# Patient Record
Sex: Male | Born: 1945
Health system: Southern US, Community
[De-identification: ages and names within clinical notes are randomized; demographics above are authoritative.]

## PROBLEM LIST (undated history)

## (undated) DIAGNOSIS — C61 Malignant neoplasm of prostate: Secondary | ICD-10-CM

## (undated) DIAGNOSIS — I499 Cardiac arrhythmia, unspecified: Secondary | ICD-10-CM

## (undated) DIAGNOSIS — E119 Type 2 diabetes mellitus without complications: Secondary | ICD-10-CM

## (undated) DIAGNOSIS — H409 Unspecified glaucoma: Principal | ICD-10-CM

## (undated) DIAGNOSIS — N529 Male erectile dysfunction, unspecified: Secondary | ICD-10-CM

## (undated) DIAGNOSIS — Z8719 Personal history of other diseases of the digestive system: Secondary | ICD-10-CM

## (undated) DIAGNOSIS — M75102 Unspecified rotator cuff tear or rupture of left shoulder, not specified as traumatic: Secondary | ICD-10-CM

## (undated) HISTORY — PX: PROSTATE BIOPSY: SHX241

## (undated) HISTORY — DX: Unspecified glaucoma: H40.9

## (undated) HISTORY — DX: Cardiac arrhythmia, unspecified: I49.9

## (undated) HISTORY — DX: Male erectile dysfunction, unspecified: N52.9

## (undated) HISTORY — PX: HERNIA REPAIR: SHX51

## (undated) HISTORY — PX: REFRACTIVE SURGERY: SHX103

## (undated) HISTORY — PX: HAND SURGERY: SHX662

## (undated) HISTORY — DX: Unspecified rotator cuff tear or rupture of left shoulder, not specified as traumatic: M75.102

---

## 1995-07-26 HISTORY — PX: BACK SURGERY: SHX140

## 2005-07-25 HISTORY — PX: SHOULDER ARTHROSCOPY: SHX128

## 2010-01-12 LAB — CBC AND DIFFERENTIAL
Platelets: 163 10*3/uL (ref 150–399)
WBC: 6.6 10^3/mL

## 2011-07-12 DIAGNOSIS — M25512 Pain in left shoulder: Secondary | ICD-10-CM | POA: Insufficient documentation

## 2011-07-13 DIAGNOSIS — Z8679 Personal history of other diseases of the circulatory system: Secondary | ICD-10-CM | POA: Insufficient documentation

## 2011-07-13 DIAGNOSIS — N400 Enlarged prostate without lower urinary tract symptoms: Secondary | ICD-10-CM | POA: Insufficient documentation

## 2011-07-13 DIAGNOSIS — Z9889 Other specified postprocedural states: Secondary | ICD-10-CM | POA: Insufficient documentation

## 2011-07-26 DIAGNOSIS — Z8719 Personal history of other diseases of the digestive system: Secondary | ICD-10-CM

## 2011-07-26 HISTORY — DX: Personal history of other diseases of the digestive system: Z87.19

## 2011-09-21 DIAGNOSIS — D126 Benign neoplasm of colon, unspecified: Secondary | ICD-10-CM | POA: Insufficient documentation

## 2011-09-29 DIAGNOSIS — I499 Cardiac arrhythmia, unspecified: Secondary | ICD-10-CM | POA: Insufficient documentation

## 2012-04-17 ENCOUNTER — Ambulatory Visit (INDEPENDENT_AMBULATORY_CARE_PROVIDER_SITE_OTHER): Payer: Medicare Other | Admitting: Family Medicine

## 2012-04-17 ENCOUNTER — Encounter: Payer: Self-pay | Admitting: Family Medicine

## 2012-04-17 VITALS — BP 153/87 | HR 63 | Ht 75.0 in | Wt 236.0 lb

## 2012-04-17 DIAGNOSIS — Z298 Encounter for other specified prophylactic measures: Secondary | ICD-10-CM

## 2012-04-17 DIAGNOSIS — I10 Essential (primary) hypertension: Secondary | ICD-10-CM

## 2012-04-17 DIAGNOSIS — M75102 Unspecified rotator cuff tear or rupture of left shoulder, not specified as traumatic: Secondary | ICD-10-CM

## 2012-04-17 DIAGNOSIS — Z23 Encounter for immunization: Secondary | ICD-10-CM

## 2012-04-17 DIAGNOSIS — Z2989 Encounter for other specified prophylactic measures: Secondary | ICD-10-CM

## 2012-04-17 DIAGNOSIS — M5430 Sciatica, unspecified side: Secondary | ICD-10-CM | POA: Insufficient documentation

## 2012-04-17 HISTORY — DX: Unspecified rotator cuff tear or rupture of left shoulder, not specified as traumatic: M75.102

## 2012-04-17 MED ORDER — HYDROCODONE-ACETAMINOPHEN 7.5-500 MG PO TABS: ORAL_TABLET | ORAL | Status: DC

## 2012-04-17 MED ORDER — PREDNISONE 20 MG PO TABS: ORAL_TABLET | ORAL | Status: AC

## 2012-04-17 NOTE — Patient Instructions (Signed)
Let me know your full medication list.  We'll likely increase your blood pressure medicine.  Return in one month to check BP and pain level.

## 2012-04-17 NOTE — Progress Notes (Signed)
CC: Calvin Wise is a 66 y.o. male is here for Establish Care   Subjective: HPI:  Former patient of mine at [facility] who presents to establish care.  He is in acute complaint of left leg pain that's been present for matter of 4 weeks off and on. Was initially present for one week following a trip to [place] in [place] in a car. He came on with a spasm-like sensation in his left low back/buttock that radiates down the back of his leg all the way to the left foot. It improves when he is up walking around on it seems to get worse with inactivity in seeing for long periods of time. He's been using the Vicodin for his shoulder to treat this pain which helps her immensely but wears off within 4 hours. Stretching seems to help even more than medication and he got into a routine where he completely alleviated the pain for one week which have come back after shot away from his home exercise plan. He denies any trauma, motor sensory disturbance, saddle paresthesia, bowel or bladder incontinence. He tells me he had back surgery in the low lumbar region back around 1992 and was told that multiple nerves were "pinched "even following the procedure. He has never had this pain before  I saw him last he was seen in orthopedics at [facility] for a left rotator cuff tear they counseled him to hold off on surgery unless it deteriorates. Is not bothered his quality of life he continues to work at an [employer] ([employer] shop without considerable pain. He denies any motor sensory disturbance is in left upper extremity  He last saw Dr. Delton See at [facility] who started him on a blood pressure medication the name of which escapes him right now based on his description sounds it may be lisinopril. He denies recent chest pain, shortness of breath, orthopnea, peripheral edema, regular heartbeat.   Review Of Systems Outlined In HPI  Past Medical History  Diagnosis Date  . Left rotator cuff tear 04/17/2012     No  family history on file. reviewed  History  Substance Use Topics  . Smoking status: Not on file  . Smokeless tobacco: Not on file  . Alcohol Use: Not on file     Objective: Filed Vitals:   04/17/12 1355  BP: 153/87  Pulse: 63    General: Alert and Oriented, No Acute Distress HEENT: Pupils equal, round, reactive to light. Conjunctivae clear. Moist mucous membranes,Neck supple without palpable lymphadenopathy nor abnormal masses. Lungs: Clear to auscultation bilaterally, no wheezing/ronchi/rales.  Comfortable work of breathing. Good air movement. Cardiac: Regular rate and rhythm. Normal S1/S2.  No murmurs, rubs, nor gallops.   Extremities: No peripheral edema.  Strong peripheral pulses.  MSK: No midline back pain, 45 degrees of flexion extension and side bending in the lumbar vertebra, no paraspinal muscular sugar tenderness. No pain over the left SI joint. Straight leg raise is negative. FAB ER and FADIR DO not elicit pain. Femoral grind does not was pain, no pain with logrolling of the femur there is no pain with palpitation of the left greater trochanter. In summary pain is not reproducible with exam. Full-strength and left lower extremity full range of motion left lower extremity. Dermatomal sensations in the left lower extremity are intact L4 DTR two over four bilaterally. Gait is normal Mental Status: No depression, anxiety, nor agitation. Skin: Warm and dry.  Assessment & Plan: Loyal was seen today for establish care.  Diagnoses and associated orders for this visit:  Essential hypertension  Left rotator cuff tear - HYDROcodone-acetaminophen (LORTAB) 7.5-500 MG per tablet; One by mouth every six hours only as needed for shoulder pain.  Sciatica  Need for prophylactic immunotherapy - Flu vaccine greater than or equal to 3yo with preservative IM  Other Orders - predniSONE (DELTASONE) 20 MG tablet; Three tabs at once daily for five days.  His provide [place] with his full  outside medication list I prepped him that we'll likely need to go up on his antihypertensive regimen. He'll continue with his home exercise plan to help relieve his sciatic pain will add a burst of prednisone to help his pain we'll have him minimize the use of his hydrocodone, described him that ideally the hydrocodone would only be used for his shoulder pain. He'll return in 4 weeks for blood pressure check and pain update however will contact him after he gets his medical care to discuss antihypertensive treatments.   Return in about 4 weeks (around 05/15/2012).

## 2012-04-18 ENCOUNTER — Encounter: Payer: Self-pay | Admitting: *Deleted

## 2012-04-24 ENCOUNTER — Telehealth: Payer: Self-pay | Admitting: *Deleted

## 2012-04-24 DIAGNOSIS — M5430 Sciatica, unspecified side: Secondary | ICD-10-CM

## 2012-04-24 NOTE — Telephone Encounter (Signed)
If it's still bothering him I'd like for Calvin Wise to think about more intensive and personalized physical therapy with one of our physical therapy specialists.  If he'd like to go along with this, we could arrange it either here at the [facility] or in [place], at [facility] for example.  Just let me know if he'd like to pursue this.

## 2012-04-24 NOTE — Telephone Encounter (Signed)
Pt states he is still having pain in his legs. He states they hurt mainly in the day while he is at work. He is on his feet most of the day at work. Pt states they pain subsides when he is not on his feet as much. The prednisone did help for a period of time and pt is still trying to do the home exercises reccommended at the last visit.Please advise

## 2012-04-24 NOTE — Telephone Encounter (Signed)
Wonderful, referral placed.

## 2012-04-24 NOTE — Telephone Encounter (Signed)
Pt returned call and he is ok with doing PT here in our building.

## 2012-05-02 ENCOUNTER — Encounter: Payer: Self-pay | Admitting: Family Medicine

## 2012-05-02 DIAGNOSIS — N529 Male erectile dysfunction, unspecified: Secondary | ICD-10-CM

## 2012-05-02 DIAGNOSIS — H409 Unspecified glaucoma: Secondary | ICD-10-CM | POA: Insufficient documentation

## 2012-05-02 DIAGNOSIS — I499 Cardiac arrhythmia, unspecified: Secondary | ICD-10-CM

## 2012-05-02 HISTORY — DX: Cardiac arrhythmia, unspecified: I49.9

## 2012-05-02 HISTORY — DX: Male erectile dysfunction, unspecified: N52.9

## 2012-05-02 HISTORY — DX: Unspecified glaucoma: H40.9

## 2012-05-22 ENCOUNTER — Ambulatory Visit: Payer: Medicare Other | Admitting: Family Medicine

## 2012-05-23 ENCOUNTER — Encounter: Payer: Self-pay | Admitting: Family Medicine

## 2012-05-23 ENCOUNTER — Ambulatory Visit (INDEPENDENT_AMBULATORY_CARE_PROVIDER_SITE_OTHER): Payer: Medicare Other | Admitting: Family Medicine

## 2012-05-23 VITALS — BP 125/70 | HR 68 | Ht 75.0 in | Wt 239.0 lb

## 2012-05-23 DIAGNOSIS — Z13220 Encounter for screening for lipoid disorders: Secondary | ICD-10-CM

## 2012-05-23 DIAGNOSIS — N529 Male erectile dysfunction, unspecified: Secondary | ICD-10-CM

## 2012-05-23 DIAGNOSIS — Z131 Encounter for screening for diabetes mellitus: Secondary | ICD-10-CM

## 2012-05-23 DIAGNOSIS — M5430 Sciatica, unspecified side: Secondary | ICD-10-CM

## 2012-05-23 DIAGNOSIS — I10 Essential (primary) hypertension: Secondary | ICD-10-CM

## 2012-05-23 NOTE — Progress Notes (Signed)
CC: Calvin Wise is a 66 y.o. male is here for Hypertension   Subjective: HPI:  Hypertension: At his last visit he was taking lisinopril 10 mg, he had been [facility] and noticing that his blood pressure was well under 140/90 after a few days of forgetting to take lisinopril. He continued to stop taking lisinopril and his blood pressures have all been well under 140/90. He decided to stop taking this indefinitely. He denies headaches, motor sensory disturbances, chest pain, irregular heartbeat, orthopnea, shortness of breath, nor peripheral edema. He reports no intolerance to lisinopril  Erectile dysfunction: He continues to take 50 mg of Viagra denies any trouble initiating or maintaining an erection. He is happy with the response of his medication.  Sciatica: He was scheduled for formal physical therapy however on the day at his first visit the sciatica at abruptly disappeared therefore he decided not to go to physical therapy. He's not doing a home exercise plan and for the past weeks his describes absolutely no pain whatsoever.  Since his last visit I reviewed some outside lab work, he's due for cholesterol screening as well as diabetic screening. He has not been fasting today   Review Of Systems Outlined In HPI  Past Medical History  Diagnosis Date  . Left rotator cuff tear 04/17/2012  . Glaucoma 05/02/2012  . Irregular heartbeat (Declines Workup 09/2011 [facility] Le[facility] Astle) 05/02/2012  . Erectile dysfunction 05/02/2012     History reviewed. No pertinent family history.   History  Substance Use Topics  . Smoking status: Former Smoker    Quit date: 05/24/2007  . Smokeless tobacco: Not on file  . Alcohol Use: Not on file     Objective: Filed Vitals:   05/23/12 1528  BP: 125/70  Pulse: 68    General: Alert and Oriented, No Acute Distress HEENT: Pupils equal, round, reactive to light. Conjunctivae clear.  External ears unremarkable,Moist mucous membranes,  pharynx without inflammation nor lesions. Lungs: Clear to auscultation bilaterally, no wheezing/ronchi/rales.  Comfortable work of breathing. Good air movement. Cardiac: Regular rate and rhythm. Normal S1/S2.  No murmurs, rubs, nor gallops.  No carotid bruits Abdomen: Soft nontender Extremities: No peripheral edema.  Strong peripheral pulses.  Mental Status: No depression, anxiety, nor agitation. Skin: Warm and dry.  Assessment & Plan: Calvin Wise was seen today for hypertension.  Diagnoses and associated orders for this visit:  Sciatica  Essential hypertension - BASIC METABOLIC PANEL WITH GFR  Need for lipid screening - Lipid panel  Diabetes mellitus screening - HgB A1c  Erectile dysfunction    Erectile dysfunction is stable, sciatica is now resolved, he is normotensive without being on any antihypertensives. Given his history of hypertension I like to check his renal function along with his fasting lipid panel, we'll do an A1c to screen for diabetes as well.  Return in about 3 months (around 08/23/2012).

## 2012-05-31 LAB — HEMOGLOBIN A1C: Hgb A1c MFr Bld: 6.5 % — ABNORMAL HIGH (ref <5.7)

## 2012-05-31 LAB — LIPID PANEL
HDL: 34 mg/dL — ABNORMAL LOW (ref >39)
LDL Cholesterol: 74 mg/dL (ref 0–99)
Total CHOL/HDL Ratio: 3.7 Ratio
Triglycerides: 91 mg/dL (ref <150)
VLDL: 18 mg/dL (ref 0–40)

## 2012-05-31 LAB — BASIC METABOLIC PANEL WITH GFR
Calcium: 9.2 mg/dL (ref 8.4–10.5)
Creat: 1.13 mg/dL (ref 0.50–1.35)
GFR, Est African American: 78 mL/min
GFR, Est Non African American: 67 mL/min

## 2012-06-28 ENCOUNTER — Telehealth: Payer: Self-pay

## 2012-06-28 NOTE — Telephone Encounter (Signed)
Patient called and left a message on nurse line asking for a return call.   Returned Call: Left message asking patient to call back.  

## 2012-06-29 ENCOUNTER — Telehealth: Payer: Self-pay | Admitting: *Deleted

## 2012-06-29 DIAGNOSIS — M5430 Sciatica, unspecified side: Secondary | ICD-10-CM

## 2012-06-29 MED ORDER — PREDNISONE 20 MG PO TABS: ORAL_TABLET | ORAL | Status: AC

## 2012-06-29 NOTE — Telephone Encounter (Signed)
Sue Lush, Will you please let Mr. Arts know that i sent in a refill for steroids, if he needs new copies of home exercise stretches please let me know because I'll be driving by his work on my way home tonight.  Thank you

## 2012-06-29 NOTE — Telephone Encounter (Signed)
Pt calls and states that his sciatic pain is flaring again and has appt with you Wednesday but would like to have something for the pain- last time you gave him steroids which helped. [facility] [address] [place]

## 2012-06-29 NOTE — Telephone Encounter (Signed)
Called home #, no answer, called cell and wife answered, she will let him know that he can pick up rx. His wife states if you would like to drop them off you can, but she didn't think he will do them

## 2012-07-04 ENCOUNTER — Encounter: Payer: Self-pay | Admitting: Family Medicine

## 2012-07-04 ENCOUNTER — Ambulatory Visit (INDEPENDENT_AMBULATORY_CARE_PROVIDER_SITE_OTHER): Payer: Medicare Other | Admitting: Family Medicine

## 2012-07-04 VITALS — BP 151/87 | HR 77 | Ht 75.0 in | Wt 232.0 lb

## 2012-07-04 DIAGNOSIS — E119 Type 2 diabetes mellitus without complications: Secondary | ICD-10-CM

## 2012-07-04 DIAGNOSIS — I10 Essential (primary) hypertension: Secondary | ICD-10-CM

## 2012-07-04 DIAGNOSIS — M5430 Sciatica, unspecified side: Secondary | ICD-10-CM

## 2012-07-04 DIAGNOSIS — E11649 Type 2 diabetes mellitus with hypoglycemia without coma: Secondary | ICD-10-CM | POA: Insufficient documentation

## 2012-07-04 NOTE — Progress Notes (Signed)
CC: Calvin Wise is a 66 y.o. male is here for Hypertension and Leg Pain   Subjective: HPI:  Patient presents for followup  Last week after a trip to [place] he began to have a burning sensation in his left buttock that would radiate down the back of his leg to his left foot. It was improved with getting up and moving around, it seems to get worse when seated for long periods of time. He started a prednisone taper and is greatly improved, he is doing a home exercise regimen it sounds like range of motion exercises but he's lost his old home exercise plan that I gave him in the past. Pain is mild in severity. He denies motor or sensory disturbances, weakness, skin changes, nor trouble walking.  At our last visit an A1c was 6.5  And he was given diagnosis of type 2 diabetes.  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 He denies polyuria polyphagia nor polydipsia. He saw his eye doctor 3 weeks ago. He denies tingling numbness or burning of the feet or extremities other than that described above. He is reluctant to start medication if he doesn't have that.  He has had elevated blood pressures in the past an outside clinic provided him with lisinopril however he's never taken this before. He points out that blood pressures are in the normal range when he is not in any pain, going times they've been elevated is when he is in discomfort from sciatica or his rotator cuff strain. He has no outside blood pressures to report. He denies chest pain, shortness of breath, motor sensory disturbances, irregular heartbeat, palpitations, orthopnea, nor peripheral edema   Review Of Systems Outlined In HPI  Past Medical History  Diagnosis Date  . Left rotator cuff tear 04/17/2012  . Glaucoma 05/02/2012  . Irregular heartbeat (Declines Workup 09/2011 [facility] Farah Lepak) 05/02/2012  . Erectile dysfunction 05/02/2012     No family history on file.   History   Substance Use Topics  . Smoking status: Former Smoker    Quit date: 05/24/2007  . Smokeless tobacco: Not on file  . Alcohol Use: Not on file     Objective: Filed Vitals:   07/04/12 0958  BP: 151/87  Pulse: 77    General: Alert and Oriented, No Acute Distress HEENT: Pupils equal, round, reactive to light. Conjunctivae clear.  External ears unremarkable, canals clear with intact TMs with appropriate landmarks.  Middle ear appears open without effusion. Pink inferior turbinates.  Moist mucous membranes, pharynx without inflammation nor lesions.  Neck supple without palpable lymphadenopathy nor abnormal masses. Lungs: Clear to auscultation bilaterally, no wheezing/ronchi/rales.  Comfortable work of breathing. Good air movement. Cardiac: Regular rate and rhythm. Normal S1/S2.  No murmurs, rubs, nor gallops.   Abdomen: Normal bowel sounds, soft and non tender without palpable masses. Extremities: No peripheral edema.  Strong peripheral pulses. Negative straight leg raise on the left, L4 and S1 DTRs are two over four bilaterally, for range of motion and strength in the left lower extremity  Mental Status: No depression, anxiety, nor agitation. Skin: Warm and dry.  Assessment & Plan: Calvin Wise was seen today for hypertension and leg pain.  Diagnoses and associated orders for this visit:  Sciatica  Essential hypertension  Type 2 diabetes mellitus - Microalbumin / creatinine urine ratio  Other Orders - Cancel: Microalbumin / creatinine urine ratio    Sciatica: Controlled and  improving printed home exercise plan for more formal rehabilitation and prevention of further deterioration Essential hypertension: Uncontrolled, I encouraged him to start lisinopril today however he declined the offer stating that he believes is because of the mild pain that he is in. He is agreed to reconsidering starting lisinopril when he returns in January if blood pressure still elevated Type 2 diabetes: He  is at goal less than 7.0, he would prefer not to start metformin at this time and will instead focus on diet and exercise interventions. He agrees that in January if A1c is increasing he will reconsider starting metformin. He is up-to-date on ophthalmology, urine studies above ordered.  Return in about 4 weeks (around 08/01/2012).

## 2012-07-05 ENCOUNTER — Encounter: Payer: Self-pay | Admitting: Family Medicine

## 2012-07-05 LAB — MICROALBUMIN / CREATININE URINE RATIO: Microalb Creat Ratio: 4.5 mg/g (ref 0.0–30.0)

## 2012-08-15 ENCOUNTER — Encounter: Payer: Self-pay | Admitting: Family Medicine

## 2012-08-15 ENCOUNTER — Ambulatory Visit (INDEPENDENT_AMBULATORY_CARE_PROVIDER_SITE_OTHER): Payer: Medicare Other | Admitting: Family Medicine

## 2012-08-15 VITALS — BP 130/68 | HR 51 | Ht 75.0 in | Wt 234.0 lb

## 2012-08-15 DIAGNOSIS — E119 Type 2 diabetes mellitus without complications: Secondary | ICD-10-CM

## 2012-08-15 DIAGNOSIS — I10 Essential (primary) hypertension: Secondary | ICD-10-CM

## 2012-08-15 DIAGNOSIS — N529 Male erectile dysfunction, unspecified: Secondary | ICD-10-CM

## 2012-08-15 MED ORDER — SILDENAFIL CITRATE 100 MG PO TABS: 100.0000 mg | ORAL_TABLET | Freq: Every day | ORAL | Status: DC | PRN

## 2012-08-15 MED ORDER — METFORMIN HCL ER 750 MG PO TB24: 750.0000 mg | ORAL_TABLET | Freq: Every day | ORAL | Status: DC

## 2012-08-15 NOTE — Progress Notes (Signed)
CC: Calvin Wise is a 67 y.o. male is here for Diabetes   Subjective: HPI:  DM2: Patient diagnosed with type 2 diabetes in October A1c 6.5. He's tried diet and exercise interventions, cutting back on sodas, try to stay active, trying to avoid medications for glycemic control. He denies polyuria polyphasia or polydipsia. He denies vision loss, motor sensory disturbances, nor poorly healing wounds or foot lesions.  Essential HTN: He was staged I hypertension last visit, he tried lifestyle and dietary interventions, cutting back on salt intake. He once require lisinopril many months ago but he would like to avoid medications if possible. He denies chest pain, vision changes, motor sensory disturbances, ureter heartbeat, palpitations, shortness of breath, headaches, orthopnea, nor peripheral edema  History of erectile dysfunction: Has been using Viagra as needed without any trouble maintaining or initiating erection. He states he has trouble doing either without taking an Viagra. Severity and character of his erections have not declined. He denies any side effects while taking this medication. He is satisfied with his response    Review Of Systems Outlined In HPI  Past Medical History  Diagnosis Date  . Left rotator cuff tear 04/17/2012  . Glaucoma 05/02/2012  . Irregular heartbeat (Declines Workup 09/2011 [facility] Kesha Hurrell) 05/02/2012  . Erectile dysfunction 05/02/2012     No family history on file.   History  Substance Use Topics  . Smoking status: Former Smoker    Quit date: 05/24/2007  . Smokeless tobacco: Not on file  . Alcohol Use: Not on file     Objective: Filed Vitals:   08/15/12 1105  BP: 130/68  Pulse: 51    General: Alert and Oriented, No Acute Distress HEENT: Pupils equal, round, reactive to light. Conjunctivae clear.  Moist mucous membranes, pharynx without inflammation nor lesions.  Neck supple without palpable lymphadenopathy nor abnormal masses. Lungs: Clear to  auscultation bilaterally, no wheezing/ronchi/rales.  Comfortable work of breathing. Good air movement. Cardiac: Regular rate and rhythm. Normal S1/S2.  No murmurs, rubs, nor gallops.   Abdomen: Soft and nontender Extremities: No peripheral edema.  Strong peripheral pulses.  Mental Status: No depression, anxiety, nor agitation. Skin: Warm and dry.  Assessment & Plan: Calvin Wise was seen today for diabetes.  Diagnoses and associated orders for this visit:  Type 2 diabetes mellitus - metFORMIN (GLUCOPHAGE-XR) 750 MG 24 hr tablet; Take 1 tablet (750 mg total) by mouth daily with breakfast.  Diabetes - POCT HgB A1C  Essential hypertension  Erectile dysfunction - Discontinue: sildenafil (VIAGRA) 100 MG tablet; Take 1 tablet (100 mg total) by mouth daily as needed. - sildenafil (VIAGRA) 100 MG tablet; Take 1 tablet (100 mg total) by mouth daily as needed.    Type 2 diabetes: A1c 7.2, uncontrolled, will start metformin. Discussed benefits of cutting back on simple sugars and increasing exercise. Microbumin to creatinine ratio up to date and normal. He did deny exam every year. Take a baby aspirin daily. Return 3 months History of essential hypertension: Controlled at the pre-hypertension state with dietary interventions, continue current regimen without antihypertensives Erectile dysfunction: Controlled and stable, continue Viagra as needed  Return in about 3 months (around 11/13/2012).

## 2012-08-23 ENCOUNTER — Ambulatory Visit: Payer: Medicare Other | Admitting: Family Medicine

## 2012-09-18 ENCOUNTER — Telehealth: Payer: Self-pay | Admitting: *Deleted

## 2012-09-18 MED ORDER — AMBULATORY NON FORMULARY MEDICATION: Status: DC

## 2012-09-18 NOTE — Telephone Encounter (Signed)
Pt calls and needs a prescription for a glucometer, test strips sent to [facility] in [facility]

## 2012-09-18 NOTE — Telephone Encounter (Signed)
Sue Lush, Rx placed in your folder ready for faxing off.

## 2012-09-18 NOTE — Telephone Encounter (Signed)
done

## 2012-11-14 ENCOUNTER — Ambulatory Visit: Payer: Medicare Other | Admitting: Family Medicine

## 2012-11-20 ENCOUNTER — Ambulatory Visit (INDEPENDENT_AMBULATORY_CARE_PROVIDER_SITE_OTHER): Payer: Medicare Other | Admitting: Family Medicine

## 2012-11-20 ENCOUNTER — Encounter: Payer: Self-pay | Admitting: Family Medicine

## 2012-11-20 VITALS — BP 130/71 | HR 46 | Ht 75.0 in | Wt 238.0 lb

## 2012-11-20 DIAGNOSIS — E119 Type 2 diabetes mellitus without complications: Secondary | ICD-10-CM

## 2012-11-20 DIAGNOSIS — B360 Pityriasis versicolor: Secondary | ICD-10-CM

## 2012-11-20 DIAGNOSIS — I10 Essential (primary) hypertension: Secondary | ICD-10-CM

## 2012-11-20 LAB — POCT GLYCOSYLATED HEMOGLOBIN (HGB A1C): Hemoglobin A1C: 6.5

## 2012-11-20 MED ORDER — FLUCONAZOLE 150 MG PO TABS: ORAL_TABLET | ORAL | Status: DC

## 2012-11-20 NOTE — Progress Notes (Signed)
CC: Calvin Wise is a 67 y.o. male is here for Diabetes   Subjective: HPI:  Followup type 2 diabetes: 4 weeks ago stopped taking metformin secondary to dizziness occurring soon after dosing that would subside within minutes after eating a snack. Since stopping metformin has not had any dizziness, tremor, polyuria polyphagia polydipsia nor poorly healing wounds. Since stopping metformin fasting blood sugars range 100-110 and two-hour postprandial sugars well below 160. Denies vision loss or motor or sensory disturbances nor tingling/burning of extremities.  Followup essential hypertension: Continues to manage with low sodium diet and staying active.  No outside blood pressures to report. Denies headaches, motor sensory disturbances, chest pain, shortness of breath, dizziness, orthopnea, nor peripheral edema  Patient complains of a black rash on his cheeks and chest. It has been present for 2 weeks. It is not itchy and does not cause discomfort. He has had this in the past and required pills to treat a fungus per his report. He denies rashes, pigmented skin lesions, nor skin abnormalities elsewhere in the body. Denies fevers, chills, nor night sweats nor swollen lymph nodes   Review Of Systems Outlined In HPI  Past Medical History  Diagnosis Date  . Left rotator cuff tear 04/17/2012  . Glaucoma 05/02/2012  . Irregular heartbeat (Declines Workup 09/2011 [facility] Kailo Kosik) 05/02/2012  . Erectile dysfunction 05/02/2012     History reviewed. No pertinent family history.   History  Substance Use Topics  . Smoking status: Former Smoker    Quit date: 05/24/2007  . Smokeless tobacco: Not on file  . Alcohol Use: Not on file     Objective: Filed Vitals:   11/20/12 1353  BP: 130/71  Pulse: 46    General: Alert and Oriented, No Acute Distress HEENT: Pupils equal, round, reactive to light. Conjunctivae clear.  Moist mucous membranes Lungs: Clear to auscultation bilaterally, no  wheezing/ronchi/rales.  Comfortable work of breathing. Good air movement. Cardiac: Regular rate and rhythm. Normal S1/S2.  No murmurs, rubs, nor gallops.  No carotid bruits Extremities: No peripheral edema.  Strong peripheral pulses.  Mental Status: No depression, anxiety, nor agitation. Skin: Warm and dry. Mocha colored 1 cm coalescing patches on both cheeks and over the left clavicle.  Assessment & Plan: Calvin Wise was seen today for diabetes.  Diagnoses and associated orders for this visit:  Type 2 diabetes mellitus - POCT HgB A1C  Essential hypertension  Tinea versicolor - fluconazole (DIFLUCAN) 150 MG tablet; Two tablets at once, repeat this dose in one week.    Type 2 diabetes: A1c of 6.5 improved from 3 months ago. Fasting blood sugars are controlled as well as postprandials therefore may continue to hold metformin and we'll consider restarting at 500 mg XR if self monitored blood sugars are above 120 fasting or 160 postprandial. Essential hypertension: Controlled with diet and exercise, continue this as intervention Tinea versicolor: Patient prefers oral fluconazole versus topical preparations.  Return in about 3 months (around 02/19/2013).

## 2013-01-04 ENCOUNTER — Ambulatory Visit (INDEPENDENT_AMBULATORY_CARE_PROVIDER_SITE_OTHER): Payer: Medicare Other | Admitting: Family Medicine

## 2013-01-04 ENCOUNTER — Encounter: Payer: Self-pay | Admitting: Family Medicine

## 2013-01-04 VITALS — BP 151/72 | HR 47 | Temp 98.0°F | Wt 237.0 lb

## 2013-01-04 DIAGNOSIS — A499 Bacterial infection, unspecified: Secondary | ICD-10-CM

## 2013-01-04 DIAGNOSIS — J329 Chronic sinusitis, unspecified: Secondary | ICD-10-CM

## 2013-01-04 DIAGNOSIS — B9689 Other specified bacterial agents as the cause of diseases classified elsewhere: Secondary | ICD-10-CM

## 2013-01-04 MED ORDER — AMOXICILLIN-POT CLAVULANATE 500-125 MG PO TABS: ORAL_TABLET | ORAL | Status: AC

## 2013-01-04 NOTE — Progress Notes (Signed)
CC: Calvin Wise is a 67 y.o. male is here for left ear pain   Subjective: HPI:  Patient complains of left sided facial pain with nasal congestion those of moderate in severity. Left facial pain localized below the left eye in the cheek radiating towards the left ear. Has been present for a little over a week slightly improved with use of nasal steroid sprays also improves with hot compresses to the face. Worse first thing in the morning slightly improved throughout the day. Nothing else makes better or worse. He denies fevers, chills, eye pain, vision loss, hearing loss, headache, dizziness, dysphagia, cough, shortness of breath. Denies popping sensation when chewing   Review Of Systems Outlined In HPI  Past Medical History  Diagnosis Date  . Left rotator cuff tear 04/17/2012  . Glaucoma 05/02/2012  . Irregular heartbeat (Declines Workup 09/2011 [facility] Korine Winton) 05/02/2012  . Erectile dysfunction 05/02/2012     No family history on file.   History  Substance Use Topics  . Smoking status: Former Smoker    Quit date: 05/24/2007  . Smokeless tobacco: Not on file  . Alcohol Use: Not on file     Objective: Filed Vitals:   01/04/13 1123  BP: 151/72  Pulse: 47  Temp: 98 F (36.7 C)    General: Alert and Oriented, No Acute Distress HEENT: Pupils equal, round, reactive to light. Conjunctivae clear.  External ears unremarkable, canals clear with intact TMs with appropriate landmarks.  Middle ear appears open without effusion. Pink inferior turbinates.  Moist mucous membranes, pharynx without inflammation nor lesions.  Neck supple without palpable lymphadenopathy nor abnormal masses. No TMJ tenderness nor palpable clicking. Left maxillary sinus tenderness to percussion Lungs: Clear and comfortable work of breathing.  Cranial nerves II through XII grossly intact Skin: Warm and dry.  Assessment & Plan: Nikodem was seen today for left ear pain.  Diagnoses and associated orders for this  visit:  Bacterial sinusitis - amoxicillin-clavulanate (AUGMENTIN) 500-125 MG per tablet; Take one by mouth every 8 hours for ten total days.    Bacterial sinusitis: Encouraged patient to start Augmentin continue using nasal steroid consider Alka-Seltzer cold and sinus and nasal saline washes as needed.Signs and symptoms requring emergent/urgent reevaluation were discussed with the patient.  Return if symptoms worsen or fail to improve.

## 2013-02-07 ENCOUNTER — Other Ambulatory Visit: Payer: Self-pay | Admitting: Family Medicine

## 2013-02-11 ENCOUNTER — Encounter: Payer: Self-pay | Admitting: Family Medicine

## 2013-02-11 ENCOUNTER — Ambulatory Visit (INDEPENDENT_AMBULATORY_CARE_PROVIDER_SITE_OTHER): Payer: Medicare Other | Admitting: Family Medicine

## 2013-02-11 VITALS — BP 140/71 | HR 49 | Wt 239.0 lb

## 2013-02-11 DIAGNOSIS — S0300XA Dislocation of jaw, unspecified side, initial encounter: Secondary | ICD-10-CM

## 2013-02-11 DIAGNOSIS — M26629 Arthralgia of temporomandibular joint, unspecified side: Secondary | ICD-10-CM

## 2013-02-11 DIAGNOSIS — N529 Male erectile dysfunction, unspecified: Secondary | ICD-10-CM

## 2013-02-11 DIAGNOSIS — M2669 Other specified disorders of temporomandibular joint: Secondary | ICD-10-CM

## 2013-02-11 MED ORDER — AMITRIPTYLINE HCL 25 MG PO TABS: 25.0000 mg | ORAL_TABLET | Freq: Every day | ORAL | Status: DC

## 2013-02-11 MED ORDER — SILDENAFIL CITRATE 100 MG PO TABS: ORAL_TABLET | ORAL | Status: DC

## 2013-02-11 NOTE — Progress Notes (Signed)
CC: Calvin Wise is a 67 y.o. male is here for left jaw pain   Subjective: HPI:  Patient complains of left jaw pain localized just anterior to the ear it is present only in the morning mild severity interfering with quality of life improves greatly throughout the day without any intervention. He has had similar pain in the past that resolved on its own he denies any recent injury or overexertion he denies dental pain ear pain headache facial pain, fevers, chills, nor motor or sensory disturbances there is a clicking component every time he opens discharge   Review Of Systems Outlined In HPI  Past Medical History  Diagnosis Date  . Left rotator cuff tear 04/17/2012  . Glaucoma 05/02/2012  . Irregular heartbeat (Declines Workup 09/2011 [facility]ain[facility]) 05/02/2012  . Erectile dysfunction 05/02/2012     No family history on file.   History  Substance Use Topics  . Smoking status: Former Smoker    Quit date: 05/24/2007  . Smokeless tobacco: Not on file  . Alcohol Use: Not on file     Objective: Filed Vitals:   02/11/13 0957  BP: 140/71  Pulse: 49    General: Alert and Oriented, No Acute Distress HEENT: Pupils equal, round, reactive to light. Conjunctivae clear.  External ears unremarkable, canals clear with intact TMs with appropriate landmarks.  Middle ear appears open without effusion. Pink inferior turbinates.  Moist mucous membranes, pharynx without inflammation nor lesions.  Neck supple without palpable lymphadenopathy nor abnormal masses. There is a palpable click over the left TMJ region when opening his jaw, the left aspect of the mandible appears open sooner and more so than the right with some mild right deviation Extremities: No peripheral edema.  Strong peripheral pulses.  Mental Status: No depression, anxiety, nor agitation. Skin: Warm and dry.  Assessment & Plan: Calvin Wise was seen today for left jaw pain.  Diagnoses and associated orders for this visit:  TMJ  (dislocation of temporomandibular joint), initial encounter - amitriptyline (ELAVIL) 25 MG tablet; Take 1 tablet (25 mg total) by mouth at bedtime.  Erectile dysfunction - sildenafil (VIAGRA) 100 MG tablet; TAKE 1 TABLET BY MOUTH DAILY AS NEEDED  TMJ syndrome    TMJ joint: Documentation above is misleading I do not believe he's dislocated his joint rather his pain is likely due to TMJ arthritis and maltracking of the mandible, we discussed isometric exercises to be performed daily preferably 3 times a day 3 sets for the next 2 weeks, may try amitriptyline every evening.  Return if symptoms worsen or fail to improve.

## 2013-03-05 ENCOUNTER — Ambulatory Visit: Payer: Medicare Other | Admitting: Family Medicine

## 2013-04-24 ENCOUNTER — Ambulatory Visit (INDEPENDENT_AMBULATORY_CARE_PROVIDER_SITE_OTHER): Payer: Medicare Other | Admitting: Family Medicine

## 2013-04-24 DIAGNOSIS — Z23 Encounter for immunization: Secondary | ICD-10-CM

## 2013-04-24 NOTE — Progress Notes (Signed)
I was present for all necessary aspects of today's visit

## 2013-05-29 ENCOUNTER — Ambulatory Visit (INDEPENDENT_AMBULATORY_CARE_PROVIDER_SITE_OTHER): Payer: Medicare Other | Admitting: Family Medicine

## 2013-05-29 ENCOUNTER — Encounter: Payer: Self-pay | Admitting: Family Medicine

## 2013-05-29 VITALS — BP 162/72 | HR 58 | Wt 237.0 lb

## 2013-05-29 DIAGNOSIS — L819 Disorder of pigmentation, unspecified: Secondary | ICD-10-CM

## 2013-05-29 DIAGNOSIS — N529 Male erectile dysfunction, unspecified: Secondary | ICD-10-CM

## 2013-05-29 DIAGNOSIS — L820 Inflamed seborrheic keratosis: Secondary | ICD-10-CM

## 2013-05-29 MED ORDER — SILDENAFIL CITRATE 100 MG PO TABS: ORAL_TABLET | ORAL | Status: DC

## 2013-05-29 NOTE — Progress Notes (Signed)
CC: Calvin Wise is a 67 y.o. male is here for lesion on the back of ear   Subjective: HPI:  Patient complains of black mass on the back of his right ear that has been present for 2-3 years it has been growing slowly over the past 2 months. It is painful only when sweaty and when he is wearing his glasses. Described as sharp pain mild in severity improves after washing skin and non-contact with anything. No interventions as of yet. It occasionally bleeds it is rough to that region. He denies any personal or family history of skin cancers. Denies unintentional weight loss or swollen lymph nodes.  Review Of Systems Outlined In HPI  Past Medical History  Diagnosis Date  . Left rotator cuff tear 04/17/2012  . Glaucoma 05/02/2012  . Irregular heartbeat (Declines Workup 09/2011 [facility] Calvin Wise) 05/02/2012  . Erectile dysfunction 05/02/2012     No family history on file.   History  Substance Use Topics  . Smoking status: Former Smoker    Quit date: 05/24/2007  . Smokeless tobacco: Not on file  . Alcohol Use: Not on file     Objective: Filed Vitals:   05/29/13 1528  BP: 162/72  Pulse: 58    General: Alert and Oriented, No Acute Distress HEENT: Pupils equal, round, reactive to light. Conjunctivae clear.  External ears unremarkable, canals clear with intact TMs with appropriate landmarks.  Middle ear appears open without effusion. Pink inferior turbinates.  Moist mucous membranes, pharynx without inflammation nor lesions.  Neck supple without palpable lymphadenopathy nor abnormal masses. The posterior ear has a 7 mm black 2 mm raised mass with well-defined borders slightly tender to touch with a stuck on appearance and a waxy appearance Extremities: No peripheral edema.  Strong peripheral pulses.  Mental Status: No depression, anxiety, nor agitation. Skin: Warm and dry.  Assessment & Plan: Calvin was seen today for lesion on the back of ear.  Diagnoses and associated orders for this  visit:  Erectile dysfunction - sildenafil (VIAGRA) 100 MG tablet; TAKE 1 TABLET BY MOUTH DAILY AS NEEDED  Pigmented skin lesion - Cancel: Dermatology pathology - Dermatology pathology    Pigmented skin lesion: Believe is very likely this is a separate keratosis that's been inflamed however it does have some atypical features that I would like to try to rule out melanoma, I was afraid a punch biopsy would would cause damage to the cartilage and is here therefore a shave biopsy was attempted. Afterwards cryotherapy was applied to the remaining lesion in a freeze thaw freeze cycle  Return if symptoms worsen or fail to improve.  Shave Biopsy Procedure Note  Pre-operative Diagnosis: Suspicious lesion  Post-operative Diagnosis: same  Locations:left posteroir ear  Indications: rule out oncologic process  Anesthesia: none  Procedure Details  History of allergy to iodine: no  Patient informed of the risks (including bleeding and infection) and benefits of the  procedure and Verbal informed consent obtained.  The lesion and surrounding area were given a sterile prep using chlorhexidine and draped in the usual sterile fashion. A scalpel was used to shave an area of skin approximately 3mm by 6mm.  Hemostasis achieved with alumuninum chloride. Antibiotic ointment and a sterile dressing applied.  The specimen was sent for pathologic examination. The patient tolerated the procedure well.  EBL: 1 ml  Findings: uncomplicated  Condition: Stable  Complications: none.  Plan: 1. Instructed to keep the wound dry and covered for 24-48h and clean thereafter. 2. Warning  signs of infection were reviewed.   3. Recommended that the patient use OTC analgesics as needed for pain.  4. Return PRN

## 2013-09-25 ENCOUNTER — Encounter: Payer: Self-pay | Admitting: Family Medicine

## 2013-09-25 ENCOUNTER — Ambulatory Visit (INDEPENDENT_AMBULATORY_CARE_PROVIDER_SITE_OTHER): Payer: Commercial Managed Care - HMO | Admitting: Family Medicine

## 2013-09-25 VITALS — BP 143/73 | HR 56 | Wt 235.0 lb

## 2013-09-25 DIAGNOSIS — J309 Allergic rhinitis, unspecified: Secondary | ICD-10-CM

## 2013-09-25 MED ORDER — MONTELUKAST SODIUM 10 MG PO TABS: 10.0000 mg | ORAL_TABLET | Freq: Every day | ORAL | Status: DC

## 2013-09-25 NOTE — Progress Notes (Signed)
CC: Calvin Wise is a 68 y.o. male is here for Allergies   Subjective: HPI:  Patient is to continue nasal congestion described as thin clear discharge from both nostrils present all hours of the day worsening over the past 2 weeks on a daily basis. Was initially improved with Flonase however this was causing nosebleeds. This has stopped ever since he stopped the medication about a month ago. He was once prescribed Zyrtec but stopped taking this at a time that he cannot remember because Flonase was helping so much with nasal congestion. He denies fevers, chills, cough, shortness of breath, wheezing, nor ocular complaints   Review Of Systems Outlined In HPI  Past Medical History  Diagnosis Date  . Left rotator cuff tear 04/17/2012  . Glaucoma 05/02/2012  . Irregular heartbeat (Declines Workup 09/2011 [facility] Stave) 05/02/2012  . Erectile dysfunction 05/02/2012    No past surgical history on file. No family history on file.  History   Social History  . Marital Status: Married    Spouse Name: N/A    Number of Children: N/A  . Years of Education: N/A   Occupational History  . Not on file.   Social History Main Topics  . Smoking status: Former Smoker    Quit date: 05/24/2007  . Smokeless tobacco: Not on file  . Alcohol Use: Not on file  . Drug Use: Not on file  . Sexual Activity: Not on file   Other Topics Concern  . Not on file   Social History Narrative  . No narrative on file     Objective: BP 143/73  Pulse 56  Wt 235 lb (106.595 kg)  General: Alert and Oriented, No Acute Distress HEENT: Pupils equal, round, reactive to light. Conjunctivae clear.  External ears unremarkable, canals clear with intact TMs with appropriate landmarks.  Middle ear appears open without effusion. Pale blue inferior turbinates with thin nasal discharge.  Moist mucous membranes, pharynx without inflammation nor lesions.  Neck supple without palpable lymphadenopathy nor abnormal masses. Lungs:  Clear to auscultation bilaterally, no wheezing/ronchi/rales.  Comfortable work of breathing. Good air movement. Mental Status: No depression, anxiety, nor agitation. Skin: Warm and dry.  Assessment & Plan: Calvin Wise was seen today for allergies.  Diagnoses and associated orders for this visit:  Allergic rhinitis - montelukast (SINGULAIR) 10 MG tablet; Take 1 tablet (10 mg total) by mouth at bedtime.    Allergic rhinitis: Restart over-the-counter Zyrtec if not improving after one week start Singulair in addition to Zyrtec   Return if symptoms worsen or fail to improve.

## 2013-10-30 ENCOUNTER — Ambulatory Visit (INDEPENDENT_AMBULATORY_CARE_PROVIDER_SITE_OTHER): Payer: Commercial Managed Care - HMO | Admitting: Family Medicine

## 2013-10-30 ENCOUNTER — Encounter: Payer: Self-pay | Admitting: Family Medicine

## 2013-10-30 VITALS — BP 143/76 | HR 59 | Ht 75.0 in | Wt 238.0 lb

## 2013-10-30 DIAGNOSIS — Z23 Encounter for immunization: Secondary | ICD-10-CM

## 2013-10-30 DIAGNOSIS — D126 Benign neoplasm of colon, unspecified: Secondary | ICD-10-CM

## 2013-10-30 DIAGNOSIS — Z0000 Encounter for general adult medical examination without abnormal findings: Secondary | ICD-10-CM

## 2013-10-30 DIAGNOSIS — I10 Essential (primary) hypertension: Secondary | ICD-10-CM

## 2013-10-30 DIAGNOSIS — Z125 Encounter for screening for malignant neoplasm of prostate: Secondary | ICD-10-CM

## 2013-10-30 DIAGNOSIS — E119 Type 2 diabetes mellitus without complications: Secondary | ICD-10-CM

## 2013-10-30 DIAGNOSIS — K635 Polyp of colon: Secondary | ICD-10-CM | POA: Insufficient documentation

## 2013-10-30 LAB — LIPID PANEL
CHOL/HDL RATIO: 4.1 ratio
CHOLESTEROL: 136 mg/dL (ref 0–200)
HDL: 33 mg/dL — ABNORMAL LOW (ref >39)
LDL Cholesterol: 87 mg/dL (ref 0–99)
Triglycerides: 82 mg/dL (ref <150)
VLDL: 16 mg/dL (ref 0–40)

## 2013-10-30 LAB — BASIC METABOLIC PANEL WITH GFR
BUN: 16 mg/dL (ref 6–23)
CHLORIDE: 104 meq/L (ref 96–112)
CO2: 27 meq/L (ref 19–32)
CREATININE: 1.05 mg/dL (ref 0.50–1.35)
Calcium: 9.1 mg/dL (ref 8.4–10.5)
GFR, EST NON AFRICAN AMERICAN: 73 mL/min
GFR, Est African American: 84 mL/min
Glucose, Bld: 127 mg/dL — ABNORMAL HIGH (ref 70–99)
POTASSIUM: 3.8 meq/L (ref 3.5–5.3)
SODIUM: 138 meq/L (ref 135–145)

## 2013-10-30 LAB — HEMOGLOBIN A1C
Hgb A1c MFr Bld: 7.2 % — ABNORMAL HIGH (ref <5.7)
MEAN PLASMA GLUCOSE: 160 mg/dL — AB (ref <117)

## 2013-10-30 NOTE — Patient Instructions (Signed)
Dr. Lajoyce Lauber General Advice Following Your Complete Physical Exam  The Benefits of Regular Exercise: Unless you suffer from an uncontrolled cardiovascular condition, studies strongly suggest that regular exercise and physical activity will add to both the quality and length of your life.  The World Health Organization recommends 150 minutes of moderate intensity aerobic activity every week.  This is best split over 3-4 days a week, and can be as simple as a brisk walk for just over 35 minutes "most days of the week".  This type of exercise has been shown to lower LDL-Cholesterol, lower average blood sugars, lower blood pressure, lower cardiovascular disease risk, improve memory, and increase one's overall sense of wellbeing.  The addition of anaerobic (or "strength training") exercises offers additional benefits including but not limited to increased metabolism, prevention of osteoporosis, and improved overall cholesterol levels.  How Can I Strive For A Low-Fat Diet?: Current guidelines recommend that 25-35 percent of your daily energy (food) intake should come from fats.  One might ask how can this be achieved without having to dissect each meal on a daily basis?  Switch to skim or 1% milk instead of whole milk.  Focus on lean meats such as ground [place], fresh fish, baked chicken, and lean cuts of beef as your source of dietary protein.  Limit saturated fat consumption to less than 10% of your daily caloric intake.  Limit trans fatty acid consumption primarily by limiting synthetic trans fats such as partially hydrogenated oils (Ex: fried fast foods).  Substitute olive or vegetable oil for solid fats where possible.  Moderation of Salt Intake: Provided you don't carry a diagnosis of congestive heart failure nor renal failure, I recommend a daily allowance of no more than 2300 mg of salt (sodium).  Keeping under this daily goal is associated with a decreased risk of cardiovascular events, creeping  above it can lead to elevated blood pressures and increases your risk of cardiovascular events.  Milligrams (mg) of salt is listed on all nutrition labels, and your daily intake can add up faster than you think.  Most canned and frozen dinners can pack in over half your daily salt allowance in one meal.    Lifestyle Health Risks: Certain lifestyle choices carry specific health risks.  As you may already know, tobacco use has been associated with increasing one's risk of cardiovascular disease, pulmonary disease, numerous cancers, among many other issues.  What you may not know is that there are medications and nicotine replacement strategies that can more than double your chances of successfully quitting.  I would be thrilled to help manage your quitting strategy if you currently use tobacco products.  When it comes to alcohol use, I've yet to find an "ideal" daily allowance.  Provided an individual does not have a medical condition that is exacerbated by alcohol consumption, general guidelines determine "safe drinking" as no more than two standard drinks for a man or no more than one standard drink for a male per day.  However, much debate still exists on whether any amount of alcohol consumption is technically "safe".  My general advice, keep alcohol consumption to a minimum for general health promotion.  If you or others believe that alcohol, tobacco, or recreational drug use is interfering with your life, I would be happy to provide confidential counseling regarding treatment options.  General "Over The Counter" Nutrition Advice: Postmenopausal women should aim for a daily calcium intake of 1200 mg, however a significant portion of this might already be  provided by diets including milk, yogurt, cheese, and other dairy products.  Vitamin D has been shown to help preserve bone density, prevent fatigue, and has even been shown to help reduce falls in the elderly.  Ensuring a daily intake of 800 Units of  Vitamin D is a good place to start to enjoy the above benefits, we can easily check your Vitamin D level to see if you'd potentially benefit from supplementation beyond 800 Units a day.  Folic Acid intake should be of particular concern to women of childbearing age.  Daily consumption of 400-800 mcg of Folic Acid is recommended to minimize the chance of spinal cord defects in a fetus should pregnancy occur.    For many adults, accidents still remain one of the most common culprits when it comes to cause of death.  Some of the simplest but most effective preventitive habits you can adopt include regular seatbelt use, proper helmet use, securing firearms, and regularly testing your smoke and carbon monoxide detectors.  Christien Frankl B. Keylin Podolsky DO [facility] [address], [address] [place], [place] [address] Phone: [phone]  Testicular Self-Exam A self-examination of your testicles involves looking at and feeling your testicles for abnormal lumps or swelling. Several things can cause swelling, lumps, or pain in your testicles. Some of these causes are:  Injuries.  Inflammation.  Infection.  Accumulation of fluids around your testicle (hydrocele).  Twisted testicles (testicular torsion).  Testicular cancer. Self-examination of the testicles and groin areas may be advised if you are at risk for testicular cancer. Risks for testicular cancer include:  An undescended testicle (cryptorchidism).  A history of previous testicular cancer.  A family history of testicular cancer. The testicles are easiest to examine after warm baths or showers and are more difficult to examine when you are cold. This is because the muscles attached to the testicles retract and pull them up higher or into the abdomen. Follow these steps while you are standing:  Hold your penis away from your body.  Roll one testicle between your thumb and forefinger, feeling the entire testicle.  Roll the other  testicle between your thumb and forefinger, feeling the entire testicle. Feel for lumps, swelling, or discomfort. A normal testicle is egg shaped and feels firm. It is smooth and not tender. The spermatic cord can be felt as a firm spaghetti-like cord at the back of your testicle. It is also important to examine the crease between the front of your leg and your abdomen. Feel for any bumps that are tender. These could be enlarged lymph nodes.  Document Released: 10/17/2000 Document Revised: 03/13/2013 Document Reviewed: 12/31/2012 [employer] Patient Information 2014 [employer]  

## 2013-10-30 NOTE — Progress Notes (Signed)
Subjective:    Calvin Wise is a 68 y.o. male who presents for Medicare Initial preventive examination.   Colonoscopy: done 2013 repeat 2016 @ [facility] Prostate: Discussed screening risks/beneifts with patient today, he'd prefer PSA  Influenza Vaccine: UTD Pneumovax: Received age 68 Td/Tdap: will receive today Zoster: will receive today    Preventive Screening-Counseling & Management  Tobacco History  Smoking status  . Former Smoker  . Quit date: 05/24/2007  Smokeless tobacco  . Not on file    Problems Prior to Visit 1. Type 2 Diabetes, Glaucoma, HTN  Current Problems (verified) Patient Active Problem List   Diagnosis Date Noted  . Colon polyp 10/30/2013  . TMJ syndrome 02/11/2013  . Type 2 diabetes mellitus 07/04/2012  . Glaucoma 05/02/2012  . Irregular heartbeat (Declines Workup 09/2011 [facility] Kioni Stahl) 05/02/2012  . Erectile dysfunction 05/02/2012  . Essential hypertension 04/17/2012  . Left rotator cuff tear 04/17/2012  . Sciatica 04/17/2012    Medications Prior to Visit Current Outpatient Prescriptions on File Prior to Visit  Medication Sig Dispense Refill  . AMBULATORY NON FORMULARY MEDICATION One glucometer (per insurance formulary) to be used to check blood sugars every morning upon awakening before eating. Dx: Non-Insulin Dependent Type 2 diabetes mellitus (250.00)  1 Units  0  . AMBULATORY NON FORMULARY MEDICATION Blood glucose testing strips per formulary coverage to best blood sugar once a day. Dx: Non-Insulin Dependent Type 2 diabetes mellitus (250.00)  100 strip  3  . amitriptyline (ELAVIL) 25 MG tablet Take 1 tablet (25 mg total) by mouth at bedtime.  30 tablet  0  . cetirizine (ZYRTEC) 10 MG tablet Take 10 mg by mouth daily.      . dorzolamide-timolol (COSOPT) 22.3-6.8 MG/ML ophthalmic solution Place 2 drops into both eyes 2 (two) times daily.      . fluticasone (FLONASE) 50 MCG/ACT nasal spray Place 2 sprays into the nose daily.      Marland Kitchen  HYDROcodone-acetaminophen (LORTAB) 7.5-500 MG per tablet One by mouth every six hours only as needed for shoulder pain.  120 tablet  0  . montelukast (SINGULAIR) 10 MG tablet Take 1 tablet (10 mg total) by mouth at bedtime.  30 tablet  3  . sildenafil (VIAGRA) 100 MG tablet TAKE 1 TABLET BY MOUTH DAILY AS NEEDED  10 tablet  3  . Travoprost (TRAVATAN OP) Apply to eye.       No current facility-administered medications on file prior to visit.    Current Medications (verified) Current Outpatient Prescriptions  Medication Sig Dispense Refill  . AMBULATORY NON FORMULARY MEDICATION One glucometer (per insurance formulary) to be used to check blood sugars every morning upon awakening before eating. Dx: Non-Insulin Dependent Type 2 diabetes mellitus (250.00)  1 Units  0  . AMBULATORY NON FORMULARY MEDICATION Blood glucose testing strips per formulary coverage to best blood sugar once a day. Dx: Non-Insulin Dependent Type 2 diabetes mellitus (250.00)  100 strip  3  . amitriptyline (ELAVIL) 25 MG tablet Take 1 tablet (25 mg total) by mouth at bedtime.  30 tablet  0  . cetirizine (ZYRTEC) 10 MG tablet Take 10 mg by mouth daily.      . dorzolamide-timolol (COSOPT) 22.3-6.8 MG/ML ophthalmic solution Place 2 drops into both eyes 2 (two) times daily.      . fluticasone (FLONASE) 50 MCG/ACT nasal spray Place 2 sprays into the nose daily.      Marland Kitchen HYDROcodone-acetaminophen (LORTAB) 7.5-500 MG per tablet One by mouth every six  hours only as needed for shoulder pain.  120 tablet  0  . montelukast (SINGULAIR) 10 MG tablet Take 1 tablet (10 mg total) by mouth at bedtime.  30 tablet  3  . sildenafil (VIAGRA) 100 MG tablet TAKE 1 TABLET BY MOUTH DAILY AS NEEDED  10 tablet  3  . Travoprost (TRAVATAN OP) Apply to eye.       No current facility-administered medications for this visit.     Allergies (verified) Aspirin   PAST HISTORY  Family History History reviewed. No pertinent family history.  Social  History History  Substance Use Topics  . Smoking status: Former Smoker    Quit date: 05/24/2007  . Smokeless tobacco: Not on file  . Alcohol Use: Not on file    Are there smokers in your home (other than you)?  No  Risk Factors Current exercise habits: Home exercise routine includes walking.  Dietary issues discussed: DASH Diet   Cardiac risk factors: advanced age (older than 72 for men, 36 for women), diabetes mellitus, hypertension and male gender.  Depression Screen (Note: if answer to either of the following is "Yes", a more complete depression screening is indicated)   Q1: Over the past two weeks, have you felt down, depressed or hopeless? No  Q2: Over the past two weeks, have you felt little interest or pleasure in doing things? No  Have you lost interest or pleasure in daily life? No  Do you often feel hopeless? No  Do you cry easily over simple problems? No  Activities of Daily Living In your present state of health, do you have any difficulty performing the following activities?:  Driving? No Managing money?  No Feeding yourself? No Getting from bed to chair? No Climbing a flight of stairs? No Preparing food and eating?: No Bathing or showering? No Getting dressed: No Getting to the toilet? No Using the toilet:No Moving around from place to place: No In the past year have you fallen or had a near fall?:No   Are you sexually active?  Yes  Do you have more than one partner?  No  Hearing Difficulties: No Do you often ask people to speak up or repeat themselves? No Do you experience ringing or noises in your ears? No Do you have difficulty understanding soft or whispered voices? No   Do you feel that you have a problem with memory? No  Do you often misplace items? No  Do you feel safe at home?  Yes  Cognitive Testing  Alert? Yes  Normal Appearance?Yes  Oriented to person? Yes  Place? Yes   Time? Yes  Recall of three objects?  Yes  Can perform simple  calculations? Yes  Displays appropriate judgment?Yes  Can read the correct time from a watch face?Yes   Advanced Directives have been discussed with the patient? Yes   List the Names of Other Physician/Practitioners you currently use: 1.  Optho at [facility]  Indicate any recent Medical Services you may have received from other than [facility] providers in the past year (date may be approximate).  Immunization History  Administered Date(s) Administered  . Influenza Split 04/17/2012  . Influenza,inj,Quad PF,36+ Mos 04/24/2013    Screening Tests Health Maintenance  Topic Date Due  . Tetanus/tdap  05/10/1965  . Zostavax  05/10/2006  . Pneumococcal Polysaccharide Vaccine Age 51 And Over  05/11/2011  . Influenza Vaccine  02/22/2014  . Colonoscopy  09/20/2021    All answers were reviewed with the patient and necessary referrals  were made:  Marcial Pacas, DO   10/30/2013   History reviewed: allergies, current medications, past family history, past medical history, past social history, past surgical history and problem list  Review of Systems  Review of Systems - General ROS: negative for - chills, fever, night sweats, weight gain or weight loss Ophthalmic ROS: negative for - decreased vision Psychological ROS: negative for - anxiety or depression ENT ROS: negative for - hearing change, nasal congestion, tinnitus or allergies Hematological and Lymphatic ROS: negative for - bleeding problems, bruising or swollen lymph nodes Breast ROS: negative Respiratory ROS: no cough, shortness of breath, or wheezing Cardiovascular ROS: no chest pain or dyspnea on exertion Gastrointestinal ROS: no abdominal pain, change in bowel habits, or black or bloody stools Genito-Urinary ROS: negative for - genital discharge, genital ulcers, incontinence or abnormal bleeding from genitals Musculoskeletal ROS: negative for - joint pain or muscle pain Neurological ROS: negative for - headaches or memory  loss Dermatological ROS: negative for lumps, mole changes, rash and skin lesion changes Objective:     Vision by Snellen chart: right eye:20/30, left eye:20/30 Blood pressure 143/76, pulse 59, height 6\' 3"  (1.905 m), weight 238 lb (107.956 kg). Body mass index is 29.75 kg/(m^2).  General: No Acute Distress HEENT: Atraumatic, normocephalic, conjunctivae normal without scleral icterus.  No nasal discharge, hearing grossly intact, TMs with good landmarks bilaterally with no middle ear abnormalities, posterior pharynx clear without oral lesions. Neck: Supple, trachea midline, no cervical nor supraclavicular adenopathy. Pulmonary: Clear to auscultation bilaterally without wheezing, rhonchi, nor rales. Cardiac: Regular rate and rhythm.  No murmurs, rubs, nor gallops. No peripheral edema.  2+ peripheral pulses bilaterally. Abdomen: Bowel sounds normal.  No masses.  Non-tender without rebound.  Negative Murphy's sign. GU: Bilateral descended testes without inguinal hernias MSK: Grossly intact, no signs of weakness.  Full strength throughout upper and lower extremities.  Full ROM in upper and lower extremities.  No midline spinal tenderness. Neuro: Gait unremarkable, CN II-XII grossly intact.  C5-C6 Reflex 2/4 Bilaterally, L4 Reflex 2/4 Bilaterally.  Cerebellar function intact. Skin: No rashes. Psych: Alert and oriented to person/place/time.  Thought process normal. No anxiety/depression.     Assessment:     Unremarkable complete physical exam due for immunizations     Plan:     During the course of the visit the patient was educated and counseled about appropriate screening and preventive services including:    Advanced directives: has NO advanced directive  - add't info requested. Referral to SW: not applicable  provided with online resources  Diet review for nutrition referral? Not indicated   Patient Instructions (the written plan) was given to the patient.  Medicare  Attestation I have personally reviewed: The patient's medical and social history Their use of alcohol, tobacco or illicit drugs Their current medications and supplements The patient's functional ability including ADLs,fall risks, home safety risks, cognitive, and hearing and visual impairment Diet and physical activities Evidence for depression or mood disorders  The patient's weight, height, BMI, and visual acuity have been recorded in the chart.  I have made referrals, counseling, and provided education to the patient based on review of the above and I have provided the patient with a written personalized care plan for preventive services.    He will receive tetanus booster and Zostavax.  Marcial Pacas, DO   10/30/2013

## 2013-10-31 ENCOUNTER — Telehealth: Payer: Self-pay | Admitting: Family Medicine

## 2013-10-31 DIAGNOSIS — E119 Type 2 diabetes mellitus without complications: Secondary | ICD-10-CM

## 2013-10-31 DIAGNOSIS — R972 Elevated prostate specific antigen [PSA]: Secondary | ICD-10-CM

## 2013-10-31 LAB — PSA, MEDICARE: PSA: 4.54 ng/mL — AB (ref <=4.00)

## 2013-10-31 MED ORDER — METFORMIN HCL 1000 MG PO TABS: ORAL_TABLET | ORAL | Status: DC

## 2013-10-31 NOTE — Telephone Encounter (Signed)
Seth Bake, Will you please let patient know that cholesterol looks good, HDL was a bit low, the higher the better, This can be improved with engaging in 30-45 minutes of moderate exercise most days of the week. The PSA prostate test was just barely above the upper limit of normal rose from 3.3 in 2011 to 4.5.  I'd recommend he have this rechecked in three months here in the office.  A1c blood sugar check was 7.2 above our goal of less than 7 therefore I'd encourage him to start a single dose of metformin every night that I've [facility].  FU for blood sugar and psa in 3 months.

## 2013-10-31 NOTE — Telephone Encounter (Signed)
Pt informed.  Misty Ahmad, LPN  

## 2013-10-31 NOTE — Telephone Encounter (Signed)
LM on VM for pt to call back for results.  Oscar La, LPN

## 2013-11-07 ENCOUNTER — Ambulatory Visit (INDEPENDENT_AMBULATORY_CARE_PROVIDER_SITE_OTHER): Payer: Commercial Managed Care - HMO | Admitting: Family Medicine

## 2013-11-07 ENCOUNTER — Encounter: Payer: Self-pay | Admitting: Family Medicine

## 2013-11-07 VITALS — BP 141/72 | HR 46 | Wt 245.0 lb

## 2013-11-07 DIAGNOSIS — H9319 Tinnitus, unspecified ear: Secondary | ICD-10-CM

## 2013-11-07 DIAGNOSIS — H6980 Other specified disorders of Eustachian tube, unspecified ear: Secondary | ICD-10-CM

## 2013-11-07 DIAGNOSIS — H6982 Other specified disorders of Eustachian tube, left ear: Secondary | ICD-10-CM

## 2013-11-07 NOTE — Progress Notes (Signed)
CC: Calvin Wise is a 68 y.o. male is here for Tinnitus   Subjective: HPI:  Complains of green a left ear that began this morning when he awoke to go to the bathroom. Was accompanied by a a fullness in his left ear. Symptoms were improved after treating some water and had gradually improved without any other interventions from time of onset to presentation in clinic. Denies any fevers, chills, dizziness, motor or sensory disturbances, vision loss. Does admit to allergies have been bothering him and he's had a mild sore throat for the past 24 hours. Denies any respiratory complaints   Review Of Systems Outlined In HPI  Past Medical History  Diagnosis Date  . Left rotator cuff tear 04/17/2012  . Glaucoma 05/02/2012  . Irregular heartbeat (Declines Workup 09/2011 WF Calvin Wise) 05/02/2012  . Erectile dysfunction 05/02/2012    No past surgical history on file. No family history on file.  History   Social History  . Marital Status: Married    Spouse Name: N/A    Number of Children: N/A  . Years of Education: N/A   Occupational History  . Not on file.   Social History Main Topics  . Smoking status: Former Smoker    Quit date: 05/24/2007  . Smokeless tobacco: Not on file  . Alcohol Use: Not on file  . Drug Use: Not on file  . Sexual Activity: Not on file   Other Topics Concern  . Not on file   Social History Narrative  . No narrative on file     Objective: BP 141/72  Pulse 46  Wt 245 lb (111.131 kg)  General: Alert and Oriented, No Acute Distress HEENT: Pupils equal, round, reactive to light. Conjunctivae clear.  External ears unremarkable, canals clear with intact TMs with appropriate landmarks.  Middle ear appears open without effusion. Pink inferior turbinates.  Moist mucous membranes, pharynx without inflammation nor lesions however mild postnasal drip.  Neck supple without palpable lymphadenopathy nor abnormal masses. Lungs: Clear to auscultation bilaterally, no  wheezing/ronchi/rales.  Comfortable work of breathing. Good air movement. Cardiac: Regular rate and rhythm. Normal S1/S2.  No murmurs, rubs, nor gallops.   Neuro: CN II-XII grossly intact, full strength/rom of all four extremities, Rinne test normal bilaterally, Weber shows equal lateralization  Mental Status: No depression, anxiety, nor agitation. Skin: Warm and dry.  Assessment & Plan: Calvin Wise was seen today for tinnitus.  Diagnoses and associated orders for this visit:  Tinnitus  Dysfunction of left Eustachian tube    Discussed the tinnitus is most likely due to left eustachian tube dysfunction, exam is reassuring that there is no indication for neuroimaging. He was given equipment for nasal saline washes to be performed twice a day since he is intolerant of nasal steroids   Return if symptoms worsen or fail to improve.

## 2014-03-12 LAB — CBC AND DIFFERENTIAL
Hemoglobin: 12.9 g/dL — AB (ref 13.5–17.5)
PLATELETS: 151 10*3/uL (ref 150–399)
WBC: 4.9 10^3/mL

## 2014-03-12 LAB — LIPID PANEL
Cholesterol: 116 mg/dL (ref 0–200)
HDL: 38 mg/dL (ref 35–70)
LDL Cholesterol: 57 mg/dL
Triglycerides: 103 mg/dL (ref 40–160)

## 2014-03-12 LAB — HEMOGLOBIN A1C: HEMOGLOBIN A1C: 6.4 % — AB (ref 4.0–6.0)

## 2014-03-12 LAB — MICROALBUMIN / CREATININE URINE RATIO: MICROALB/CREAT RATIO: 3.6

## 2014-03-12 LAB — BASIC METABOLIC PANEL: Creatinine: 1.1 mg/dL (ref 0.6–1.3)

## 2014-03-25 ENCOUNTER — Encounter: Payer: Self-pay | Admitting: Family Medicine

## 2014-03-25 ENCOUNTER — Ambulatory Visit (INDEPENDENT_AMBULATORY_CARE_PROVIDER_SITE_OTHER): Payer: Commercial Managed Care - HMO | Admitting: Family Medicine

## 2014-03-25 VITALS — BP 147/74 | HR 51 | Ht 75.0 in | Wt 239.0 lb

## 2014-03-25 DIAGNOSIS — E119 Type 2 diabetes mellitus without complications: Secondary | ICD-10-CM

## 2014-03-25 DIAGNOSIS — Z23 Encounter for immunization: Secondary | ICD-10-CM

## 2014-03-25 DIAGNOSIS — R972 Elevated prostate specific antigen [PSA]: Secondary | ICD-10-CM

## 2014-03-25 DIAGNOSIS — N529 Male erectile dysfunction, unspecified: Secondary | ICD-10-CM

## 2014-03-25 DIAGNOSIS — I10 Essential (primary) hypertension: Secondary | ICD-10-CM

## 2014-03-25 MED ORDER — SILDENAFIL CITRATE 20 MG PO TABS: 20.0000 mg | ORAL_TABLET | Freq: Every day | ORAL | Status: DC | PRN

## 2014-03-25 NOTE — Progress Notes (Signed)
CC: Calvin Wise is a 68 y.o. male is here for Diabetes   Subjective: HPI:  Followup type 2 diabetes: Since I saw him last he restarted metformin he is taking this on a daily basis without known side effects or intolerance. No outside blood sugars to report. Denies polyuria polyphasia or polydipsia. He recently had an A1c obtained at the [place] which was 6.4  Followup elevated PSA: Since I saw him last he has not had his PSA rechecked. He denies any urinary symptoms. Denies waking up in the night to urinate. No blood in the urine when checked at the [facility]  Followup erectile dysfunction: He had 100% symptom resolution when taking Viagra however is too expensive for him to take as frequently as he would desire to. Symptoms are still moderate to severe in severity with difficulty initiating and maintaining an erection when not using this medication. Denies chest pain since I saw him last. He inquires about a generic alternative  Followup essential hypertension: Currently not taking any blood pressure medication. No physical exercise routine, tries to watch his salt intake. Denies chest pain shortness of breath orthopnea nor peripheral edema   Review Of Systems Outlined In HPI  Past Medical History  Diagnosis Date  . Left rotator cuff tear 04/17/2012  . Glaucoma 05/02/2012  . Irregular heartbeat (Declines Workup 09/2011 WF Judith Demps) 05/02/2012  . Erectile dysfunction 05/02/2012    No past surgical history on file. No family history on file.  History   Social History  . Marital Status: Married    Spouse Name: N/A    Number of Children: N/A  . Years of Education: N/A   Occupational History  . Not on file.   Social History Main Topics  . Smoking status: Former Smoker    Quit date: 05/24/2007  . Smokeless tobacco: Not on file  . Alcohol Use: Not on file  . Drug Use: Not on file  . Sexual Activity: Not on file   Other Topics Concern  . Not on file   Social History Narrative  . No  narrative on file     Objective: BP 147/74  Pulse 51  Ht 6\' 3"  (1.905 m)  Wt 239 lb (108.41 kg)  BMI 29.87 kg/m2  General: Alert and Oriented, No Acute Distress HEENT: Pupils equal, round, reactive to light. Conjunctivae clear.  Moist mucous membranes pharynx unremarkable Lungs: Clear to auscultation bilaterally, no wheezing/ronchi/rales.  Comfortable work of breathing. Good air movement. Cardiac: Regular rate and rhythm. Normal S1/S2.  No murmurs, rubs, nor gallops.   Extremities: No peripheral edema.  Strong peripheral pulses.  Mental Status: No depression, anxiety, nor agitation. Skin: Warm and dry.  Assessment & Plan: [place] was seen today for diabetes.  Diagnoses and associated orders for this visit:  Flu vaccine need - Flu Vaccine QUAD 36+ mos IM  Type 2 diabetes mellitus without complication  Elevated PSA - PSA  Erectile dysfunction, unspecified erectile dysfunction type - sildenafil (REVATIO) 20 MG tablet; Take 1-3 tablets (20-60 mg total) by mouth daily as needed (sex).  Essential hypertension    Type 2 diabetes: Controlled continue metformin, return in 3 months for recheck, flu vaccine today is too early for Prevnar given insurance constrictions Elevated PSA: Repeating today Erectile dysfunction: Start generic Viagra to be obtained at [facility] drug Essential hypertension: Controlled on diet interventions alone.   Return in about 3 months (around 06/24/2014) for Diabetes Follow Up.

## 2014-03-26 ENCOUNTER — Telehealth: Payer: Self-pay | Admitting: Family Medicine

## 2014-03-26 ENCOUNTER — Encounter: Payer: Self-pay | Admitting: Family Medicine

## 2014-03-26 DIAGNOSIS — R972 Elevated prostate specific antigen [PSA]: Secondary | ICD-10-CM

## 2014-03-26 LAB — PSA: PSA: 5.31 ng/mL — AB (ref <=4.00)

## 2014-03-26 NOTE — Telephone Encounter (Signed)
Calvin Wise, Will you please let patient know that his PSA prostate test continues to rise above the upper limit of normal.  This reflects his risk of prostate cancer is above average and I would recommend he meet with a urologist for further evaluation.  I've placed an order for this, please let us know if not contacted about scheduling by next week.

## 2014-03-26 NOTE — Telephone Encounter (Signed)
Message left on vm with results  

## 2014-04-07 ENCOUNTER — Telehealth: Payer: Self-pay | Admitting: *Deleted

## 2014-04-07 NOTE — Telephone Encounter (Signed)
Pt called asking about the status of referral. Referral was sent but pt has not heard from them.   I left on his vm the phone number to [facility]

## 2014-05-15 ENCOUNTER — Encounter: Payer: Self-pay | Admitting: Family Medicine

## 2014-05-29 ENCOUNTER — Other Ambulatory Visit: Payer: Self-pay | Admitting: *Deleted

## 2014-05-29 DIAGNOSIS — N529 Male erectile dysfunction, unspecified: Secondary | ICD-10-CM

## 2014-05-29 MED ORDER — SILDENAFIL CITRATE 20 MG PO TABS: 20.0000 mg | ORAL_TABLET | Freq: Every day | ORAL | Status: DC | PRN

## 2014-06-11 ENCOUNTER — Encounter: Payer: Self-pay | Admitting: Family Medicine

## 2014-06-11 DIAGNOSIS — C61 Malignant neoplasm of prostate: Secondary | ICD-10-CM | POA: Insufficient documentation

## 2014-06-11 DIAGNOSIS — Z8546 Personal history of malignant neoplasm of prostate: Secondary | ICD-10-CM | POA: Insufficient documentation

## 2014-06-12 ENCOUNTER — Telehealth: Payer: Self-pay | Admitting: Family Medicine

## 2014-06-12 DIAGNOSIS — C61 Malignant neoplasm of prostate: Secondary | ICD-10-CM

## 2014-06-12 NOTE — Telephone Encounter (Signed)
Calvin Wise with [facility] called. Calvin Wise has been diagnosed with Prostate Cancer and they would need Dr to put in referral to [place] for him to see Dr. Tyler Pita.  His appointment is scheduled for Dec 14th @ 2:30 and pt is aware of the appt.  Thank you.

## 2014-06-12 NOTE — Telephone Encounter (Signed)
Referral placed.

## 2014-07-07 ENCOUNTER — Encounter: Payer: Self-pay | Admitting: Radiation Oncology

## 2014-07-07 ENCOUNTER — Ambulatory Visit
Admission: RE | Admit: 2014-07-07 | Discharge: 2014-07-07 | Disposition: A | Discharge status: Home / Self Care | Payer: Commercial Managed Care - HMO | Source: Ambulatory Visit | Attending: Radiation Oncology | Admitting: Radiation Oncology

## 2014-07-07 VITALS — BP 150/58 | HR 56 | Resp 16 | Ht 75.0 in | Wt 244.8 lb

## 2014-07-07 DIAGNOSIS — C61 Malignant neoplasm of prostate: Secondary | ICD-10-CM | POA: Diagnosis present

## 2014-07-07 HISTORY — DX: Malignant neoplasm of prostate: C61

## 2014-07-07 NOTE — Progress Notes (Signed)
GU Location of Tumor / Histology: prostatic adenocarcinoma  If Prostate Cancer, Gleason Score is (3 + 3) and PSA is (5.31)  Calvin Wise presented with an elevated PSA which was drawn as part of a prostate cancer screening.  Biopsies of prostate (if applicable) revealed:    Past/Anticipated interventions by urology, if any: prostate biopsy and referral to Dr. Tammi Klippel   Past/Anticipated interventions by medical oncology, if any: no  Weight changes, if any: no  Bowel/Bladder complaints, if any: denies hematuria, dysuria, or urgency. Describes a strong steady urine stream. Denies pain with bowel movements. Denies bone pain, night sweats or unintentional weight loss.   Nausea/Vomiting, if any: no  Pain issues, if any:  no  SAFETY ISSUES:  Prior radiation? no  Pacemaker/ICD? no  Possible current pregnancy? no  Is the patient on methotrexate? no  Current Complaints / other details:  68 year old male. 36 cc prostate.

## 2014-07-07 NOTE — Progress Notes (Signed)
See progress note under physician encounter. 

## 2014-07-07 NOTE — Progress Notes (Signed)
Radiation Oncology         [phone] ________________________________  Initial outpatient Consultation  Name: Calvin Wise MRN: [id number]  Date: 07/07/2014  DOB: [birth year]  [id number], Hilliard Clark, DO  Ardis Hughs, MD   REFERRING PHYSICIAN: Ardis Hughs, MD  DIAGNOSIS: 68 y.o. gentleman with stage T1c adenocarcinoma of the prostate with a Gleason's score of 3+4 and a PSA of 5.31    ICD-9-CM ICD-10-CM   1. Prostate cancer Calvin Wise is a 68 y.o. gentleman.  He was noted to have an elevated PSA of 5.31 by his primary care physician, Dr. Ileene Rubens.  Accordingly, he was referred for evaluation in urology by Dr. Louis Meckel on 06/10/14,  digital rectal examination was performed at that time revealing no nodules or induration.  The patient proceeded to transrectal ultrasound with 12 biopsies of the prostate on 06/12/14.  The prostate volume measured 36 cc.  Out of 12 core biopsies, 5 were positive.  The maximum Gleason score was 3+4, and this was seen in the distribution displayed in the graphic below.    The patient reviewed the biopsy results with his urologist and he has kindly been referred today for discussion of potential radiation treatment options.  PREVIOUS RADIATION THERAPY: No  PAST MEDICAL HISTORY:  has a past medical history of Left rotator cuff tear (04/17/2012); Glaucoma (05/02/2012); Irregular heartbeat (Declines Workup 09/2011 [facility]) (05/02/2012); Erectile dysfunction (05/02/2012); and Prostate cancer.    PAST SURGICAL HISTORY: Past Surgical History  Procedure Laterality Date  . Prostate biopsy    . Back surgery    . Hernia repair    . Hand surgery    . Shoulder arthroscopy      x2  . Refractive surgery      FAMILY HISTORY: family history includes Alcoholism in his father; Cancer in his cousin, cousin, and cousin.  SOCIAL HISTORY:  reports that he quit smoking about 7 years ago. His smoking use included  Cigarettes. He has a 11 pack-year smoking history. He has never used smokeless tobacco. He reports that he does not drink alcohol or use illicit drugs.  ALLERGIES: Aspirin  MEDICATIONS:  Current Outpatient Prescriptions  Medication Sig Dispense Refill  . amitriptyline (ELAVIL) 25 MG tablet Take 1 tablet (25 mg total) by mouth at bedtime. 30 tablet 0  . aspirin EC 81 MG tablet Take 81 mg by mouth.    . bimatoprost (LUMIGAN) 0.01 % SOLN 1 drop.    . brimonidine (ALPHAGAN P) 0.1 % SOLN 1 drop.    . cetirizine (ZYRTEC) 10 MG tablet Take 10 mg by mouth daily.    . dorzolamide-timolol (COSOPT) 22.3-6.8 MG/ML ophthalmic solution 1 drop.    Marland Kitchen HYDROcodone-acetaminophen (LORTAB) 7.5-500 MG per tablet One by mouth every six hours only as needed for shoulder pain. 120 tablet 0  . metFORMIN (GLUCOPHAGE) 1000 MG tablet One tablet by mouth every evening for blood sugar control. 30 tablet 5  . sildenafil (REVATIO) 20 MG tablet Take 1-3 tablets (20-60 mg total) by mouth daily as needed (sex). 50 tablet 0  . sildenafil (VIAGRA) 100 MG tablet TAKE 1/2 TABLET BY MOUTH DAILY AS NEEDED    . Travoprost (TRAVATAN OP) Apply to eye.    Marland Kitchen AMBULATORY NON FORMULARY MEDICATION Blood glucose testing strips per formulary coverage to best blood sugar once a day. Dx: Non-Insulin Dependent Type 2 diabetes mellitus (250.00) (Patient not taking: Reported on 07/07/2014) 100 strip 3  No current facility-administered medications for this encounter.    REVIEW OF SYSTEMS:  A 15 point review of systems is documented in the electronic medical record. This was obtained by the nursing staff. However, I reviewed this with the patient to discuss relevant findings and make appropriate changes.  A comprehensive review of systems was negative..  The patient completed an IPSS and IIEF questionnaire.  His IPSS score was zero indicating mild urinary outflow obstructive symptoms.  He indicated that his erectile function is able to complete sexual  activity on almost all attempts.   PHYSICAL EXAM: This patient is in no acute distress.  He is alert and oriented.   height is 6\' 3"  (1.905 m) and weight is 244 lb 12.8 oz (111.041 kg). His blood pressure is 150/58 and his pulse is 56. His respiration is 16.  He exhibits no respiratory distress or labored breathing.  He appears neurologically intact.  His mood is pleasant.  His affect is appropriate.  Please note the digital rectal exam findings described above.  KPS = 100  100 - Normal; no complaints; no evidence of disease. 90   - Able to carry on normal activity; minor signs or symptoms of disease. 80   - Normal activity with effort; some signs or symptoms of disease. 8   - Cares for self; unable to carry on normal activity or to do active work. 60   - Requires occasional assistance, but is able to care for most of his personal needs. 50   - Requires considerable assistance and frequent medical care. 39   - Disabled; requires special care and assistance. 54   - Severely disabled; hospital admission is indicated although death not imminent. 33   - Very sick; hospital admission necessary; active supportive treatment necessary. 10   - Moribund; fatal processes progressing rapidly. 0     - Dead  Karnofsky DA, [place], Craver LS and Burchenal [facility] [phone]) The use of the nitrogen mustards in the palliative treatment of carcinoma: with particular reference to bronchogenic carcinoma Cancer 1 634-56   LABORATORY DATA:  Lab Results  Component Value Date   WBC 4.9 03/12/2014   HGB 12.9* 03/12/2014   PLT 151 03/12/2014   Lab Results  Component Value Date   NA 138 10/30/2013   K 3.8 10/30/2013   CL 104 10/30/2013   CO2 27 10/30/2013   No results found for: ALT, AST, GGT, ALKPHOS, BILITOT   RADIOGRAPHY: No results found.    IMPRESSION: This gentleman is a 68 y.o. gentleman with stage T1c adenocarcinoma of the prostate with a Gleason's score of 3+4 and a PSA of 5.31.  His T-Stage,  Gleason's Score, and PSA put him into the intermediate risk group.  His primary grade of 3 within his Gleason 7 disease along with his unilateral low volume involvement of Gleason 7 make him eligible for prostate brachytherapy as monotherapy. Accordingly he is eligible for a variety of potential treatment options including radical prostatectomy, external beam radiation treatment, or prostate seed implant.Marland Kitchen  PLAN:Today I reviewed the findings and workup thus far.  We discussed the natural history of prostate cancer.  We reviewed the the implications of T-stage, Gleason's Score, and PSA on decision-making and outcomes in prostate cancer.  We discussed radiation treatment in the management of prostate cancer with regard to the logistics and delivery of external beam radiation treatment as well as the logistics and delivery of prostate brachytherapy.  We compared and contrasted each of these approaches  and also compared these against prostatectomy.  The patient expressed interest in prostate brachytherapy.  I filled out a patient counseling form for him with relevant treatment diagrams and we retained a copy for our records.   The patient would like to proceed with prostate brachytherapy.  I will share my findings with Dr. Louis Meckel and move forward with scheduling the procedure in the near future.     I enjoyed meeting with him today, and will look forward to participating in the care of this very nice gentleman.   I spent 60 minutes face to face with the patient and more than 50% of that time was spent in counseling and/or coordination of care.   ------------------------------------------------  Sheral Apley. Tammi Klippel, M.D.

## 2014-07-08 ENCOUNTER — Telehealth: Payer: Self-pay | Admitting: *Deleted

## 2014-07-08 DIAGNOSIS — C61 Malignant neoplasm of prostate: Secondary | ICD-10-CM | POA: Diagnosis present

## 2014-07-08 NOTE — Telephone Encounter (Signed)
CALLED PATIENT TO ASK QUESTION, LVM FOR A RETURN CALL 

## 2014-07-08 NOTE — Telephone Encounter (Signed)
Pt requests to have urology labs, ect. sent to the [place]. [phone]. Labs faxed

## 2014-07-10 ENCOUNTER — Telehealth: Payer: Self-pay | Admitting: Radiation Oncology

## 2014-07-10 NOTE — Telephone Encounter (Signed)
Patient left message on the RN's voicemail stating he had a question. Phoned patient back twice today but, no answer either time. Left message both times requesting return call.

## 2014-07-30 ENCOUNTER — Telehealth: Payer: Self-pay | Admitting: *Deleted

## 2014-07-30 NOTE — Telephone Encounter (Signed)
CALLED PATIENT TO REMIND OF APPT. FOR 07-31-14, LVM FOR A RETURN CALL

## 2014-07-31 ENCOUNTER — Ambulatory Visit
Admission: RE | Admit: 2014-07-31 | Discharge: 2014-07-31 | Disposition: A | Discharge status: Home / Self Care | Payer: Commercial Managed Care - HMO | Source: Ambulatory Visit | Attending: Radiation Oncology | Admitting: Radiation Oncology

## 2014-07-31 ENCOUNTER — Other Ambulatory Visit: Payer: Self-pay | Admitting: Urology

## 2014-07-31 DIAGNOSIS — C61 Malignant neoplasm of prostate: Secondary | ICD-10-CM | POA: Diagnosis not present

## 2014-07-31 NOTE — Progress Notes (Signed)
  Radiation Oncology         [phone] ________________________________  Name: SULTAN PARGAS MRN: [id number]  Date: 07/31/2014  DOB: 23-Aug-1945  SIMULATION AND TREATMENT PLANNING NOTE PUBIC ARCH STUDY  YH:OOILNZ, Hilliard Clark, DO  Ardis Hughs, MD  DIAGNOSIS: 69 y.o. gentleman with stage T1c adenocarcinoma of the prostate with a Gleason's score of 3+4 and a PSA of 5.31     ICD-9-CM ICD-10-CM   1. Prostate cancer 185 C61     COMPLEX SIMULATION:  The patient presented today for evaluation for possible prostate seed implant. He was brought to the radiation planning suite and placed supine on the CT couch. A 3-dimensional image study set was obtained in upload to the planning computer. There, on each axial slice, I contoured the prostate gland. Then, using three-dimensional radiation planning tools I reconstructed the prostate in view of the structures from the transperineal needle pathway to assess for possible pubic arch interference. In doing so, I did not appreciate any pubic arch interference. Also, the patient's prostate volume was estimated based on the drawn structure. The volume was 40 cc.  Given the pubic arch appearance and prostate volume, patient remains a good candidate to proceed with prostate seed implant. Today, he freely provided informed written consent to proceed.    PLAN: The patient will undergo prostate seed implant.   ________________________________  Sheral Apley. Tammi Klippel, M.D.

## 2014-08-05 ENCOUNTER — Ambulatory Visit (INDEPENDENT_AMBULATORY_CARE_PROVIDER_SITE_OTHER): Payer: Medicare Other | Admitting: Family Medicine

## 2014-08-05 ENCOUNTER — Encounter: Payer: Self-pay | Admitting: Family Medicine

## 2014-08-05 VITALS — BP 138/75 | HR 63 | Wt 245.0 lb

## 2014-08-05 DIAGNOSIS — C61 Malignant neoplasm of prostate: Secondary | ICD-10-CM

## 2014-08-05 DIAGNOSIS — N61 Inflammatory disorders of breast: Secondary | ICD-10-CM

## 2014-08-05 MED ORDER — SULFAMETHOXAZOLE-TRIMETHOPRIM 800-160 MG PO TABS: ORAL_TABLET | ORAL | Status: AC

## 2014-08-05 MED ORDER — CEPHALEXIN 500 MG PO CAPS: 500.0000 mg | ORAL_CAPSULE | Freq: Three times a day (TID) | ORAL | Status: DC

## 2014-08-05 NOTE — Progress Notes (Signed)
CC: Calvin Wise is a 69 y.o. male is here for discuss upcoming surgery and sore spot on chest   Subjective: HPI:  Left breast pain nd swelling with redness that has been present since Thursday of last week. Interventions have included a heating pad which seems to help reduce the firmness. Over the weekend after heating pad application he was able to express some white discharge from the nipple which provided temporary relief from the mild pain he's been experiencing. He denies any recent or remote trauma to the left breast. He had identical symptoms 20 years ago which resolved after an unknown antibiotic. Other than that he states he is in his regular state of health without any fevers, chills, nausea, unintentional weight gain or loss, no chest pain other than that described above.  He scheduled to have radiation implant seeding later this month. He is extremely optimistic about this treatment for his prostate cancer and currently the ID of prostate cancer has not been significantly interfering with his quality of life.   Review Of Systems Outlined In HPI  Past Medical History  Diagnosis Date  . Left rotator cuff tear 04/17/2012  . Glaucoma 05/02/2012  . Irregular heartbeat (Declines Workup 09/2011 [facility]) 05/02/2012  . Erectile dysfunction 05/02/2012  . Prostate cancer     Past Surgical History  Procedure Laterality Date  . Prostate biopsy    . Back surgery    . Hernia repair    . Hand surgery    . Shoulder arthroscopy      x2  . Refractive surgery     Family History  Problem Relation Age of Onset  . Alcoholism Father   . Cancer Cousin   . Cancer Cousin   . Cancer Cousin     History   Social History  . Marital Status: Married    Spouse Name: N/A    Number of Children: N/A  . Years of Education: N/A   Occupational History  . Not on file.   Social History Main Topics  . Smoking status: Former Smoker -- 0.25 packs/day for 44 years    Types: Cigarettes    Quit  date: 05/24/2007  . Smokeless tobacco: Never Used  . Alcohol Use: No  . Drug Use: No  . Sexual Activity: Yes   Other Topics Concern  . Not on file   Social History Narrative     Objective: BP 138/75 mmHg  Pulse 63  Wt 245 lb (111.131 kg)  General: Alert and Oriented, No Acute Distress HEENT: Pupils equal, round, reactive to light. Conjunctivae clear.  Moist weakness membranes Lungs: Clear to auscultation bilaterally, no wheezing/ronchi/rales.  Comfortable work of breathing. Good air movement. Cardiac: Regular rate and rhythm. Normal S1/S2.  No murmurs, rubs, nor gallops.   Extremities: No peripheral edema.  Strong peripheral pulses.  Mental Status: No depression, anxiety, nor agitation. Skin: Warm and dry. Mildly erythematous and edematous left breast 2 fingerbreadths around the areola.  Bedside ultrasound of this region does not show any hypoechoic pockets of fluid underlying the site of his discomfort.  Assessment & Plan: [name] was seen today for discuss upcoming surgery and sore spot on chest.  Diagnoses and associated orders for this visit:  Prostate cancer  Mastitis - sulfamethoxazole-trimethoprim (SEPTRA DS) 800-160 MG per tablet; One by mouth twice a day for ten days. - cephALEXin (KEFLEX) 500 MG capsule; Take 1 capsule (500 mg total) by mouth 3 (three) times daily.    Prostate cancer currently  managed by radiation oncology I'm happy to see that he is not letting prostate cancer change his optimistic outlook on life. Mastitis: Start full coverage of skin flora with Septra and Keflex. Call me if no better by Friday, will need imaging at that point if no improvement.  Return if symptoms worsen or fail to improve.

## 2014-08-06 ENCOUNTER — Other Ambulatory Visit: Payer: Self-pay

## 2014-08-06 ENCOUNTER — Ambulatory Visit (HOSPITAL_BASED_OUTPATIENT_CLINIC_OR_DEPARTMENT_OTHER)
Admission: RE | Admit: 2014-08-06 | Discharge: 2014-08-06 | Disposition: A | Discharge status: Home / Self Care | Payer: Commercial Managed Care - HMO | Source: Ambulatory Visit | Attending: Urology | Admitting: Urology

## 2014-08-06 ENCOUNTER — Encounter (HOSPITAL_BASED_OUTPATIENT_CLINIC_OR_DEPARTMENT_OTHER)
Admission: RE | Admit: 2014-08-06 | Discharge: 2014-08-06 | Disposition: A | Discharge status: Home / Self Care | Payer: Commercial Managed Care - HMO | Source: Ambulatory Visit | Attending: Urology | Admitting: Urology

## 2014-08-06 DIAGNOSIS — C61 Malignant neoplasm of prostate: Secondary | ICD-10-CM | POA: Diagnosis not present

## 2014-08-06 DIAGNOSIS — Z01818 Encounter for other preprocedural examination: Secondary | ICD-10-CM | POA: Diagnosis present

## 2014-08-18 ENCOUNTER — Telehealth: Payer: Self-pay | Admitting: *Deleted

## 2014-08-18 DIAGNOSIS — N61 Mastitis without abscess: Secondary | ICD-10-CM

## 2014-08-18 MED ORDER — CLINDAMYCIN HCL 300 MG PO CAPS: 300.0000 mg | ORAL_CAPSULE | Freq: Three times a day (TID) | ORAL | Status: DC

## 2014-08-18 NOTE — Telephone Encounter (Signed)
Pt states he has completed to abx course he still has some swelling on his chest.Please advise

## 2014-08-18 NOTE — Telephone Encounter (Signed)
Pt.notified

## 2014-08-18 NOTE — Telephone Encounter (Signed)
Seth Bake, Will you please let patient know that I sent an Rx of clindamycin to [facility], this is another antibiotic.  Since it's taking so long for it to resolve I'd recommend he have a more sophisticated ultrasound test, and order for this has been placed up front.

## 2014-08-19 ENCOUNTER — Telehealth: Payer: Self-pay

## 2014-08-19 ENCOUNTER — Telehealth: Payer: Self-pay | Admitting: *Deleted

## 2014-08-19 MED ORDER — AMOXICILLIN-POT CLAVULANATE 500-125 MG PO TABS: ORAL_TABLET | ORAL | Status: AC

## 2014-08-19 NOTE — Telephone Encounter (Signed)
Prior Auth is not required for Ultrasound left breast inc axilla

## 2014-08-19 NOTE — Telephone Encounter (Signed)
Pt left a message that the clindamycin caused him to break out in a rash. Please advise

## 2014-08-19 NOTE — Telephone Encounter (Signed)
Stop clindamycin, begin amoxicillin as a substitute which I've sent to [facility] on [address] [employer]

## 2014-08-20 ENCOUNTER — Encounter: Payer: Self-pay | Admitting: Family Medicine

## 2014-08-20 ENCOUNTER — Ambulatory Visit (INDEPENDENT_AMBULATORY_CARE_PROVIDER_SITE_OTHER): Payer: Medicare Other | Admitting: Family Medicine

## 2014-08-20 VITALS — BP 115/59 | HR 74 | Wt 246.0 lb

## 2014-08-20 DIAGNOSIS — T7840XA Allergy, unspecified, initial encounter: Secondary | ICD-10-CM | POA: Diagnosis not present

## 2014-08-20 DIAGNOSIS — N529 Male erectile dysfunction, unspecified: Secondary | ICD-10-CM | POA: Diagnosis not present

## 2014-08-20 MED ORDER — METHYLPREDNISOLONE ACETATE 40 MG/ML IJ SUSP: 80.0000 mg | Freq: Once | INTRAMUSCULAR | Status: DC

## 2014-08-20 MED ORDER — SILDENAFIL CITRATE 20 MG PO TABS: 20.0000 mg | ORAL_TABLET | Freq: Every day | ORAL | Status: DC | PRN

## 2014-08-20 MED ORDER — HYDROXYZINE HCL 25 MG PO TABS: 25.0000 mg | ORAL_TABLET | Freq: Three times a day (TID) | ORAL | Status: DC | PRN

## 2014-08-20 MED ORDER — METHYLPREDNISOLONE ACETATE 80 MG/ML IJ SUSP
80.0000 mg | Freq: Once | INTRAMUSCULAR | Status: AC
  Administered 2014-08-20: 80 mg via INTRAMUSCULAR

## 2014-08-20 NOTE — Progress Notes (Signed)
CC: Calvin Wise is a 69 y.o. male is here for Urticaria   Subjective: HPI:  12 hours after taking a single dose of clindamycin he began to get itching on the torso and legs. He was concerned that he was having an allergic reaction so he did not take any more of the clindamycin. 6 hours later he had hives on his trunk chest arms and legs. Symptoms were barely improved with taking 8 25 mg tablets of Benadryl and with topical Benadryl. Symptoms are still moderate in severity and have not improved that much since onset 2 days ago. He switched to Augmentin that was prescribed yesterday and has taken 2 doses without any new symptoms. He denies any other medication, personal care product, or change in environmental exposure other than clindamycin described above. He denies flushing, fevers, chills, nausea, abdominal pain, diarrhea, wheezing shortness of breath nor dysphagia. The itch is moderately to severely bothering him.  Requesting refills on generic Viagra, it works great for his erectile dysfunction.  He still has some left breast swelling however tenderness is gone and no longer feels warm. We are awaiting a prior authorization for a breast ultrasound.   Review Of Systems Outlined In HPI  Past Medical History  Diagnosis Date  . Left rotator cuff tear 04/17/2012  . Glaucoma 05/02/2012  . Irregular heartbeat (Declines Workup 09/2011 [facility]) 05/02/2012  . Erectile dysfunction 05/02/2012  . Prostate cancer     Past Surgical History  Procedure Laterality Date  . Prostate biopsy    . Back surgery    . Hernia repair    . Hand surgery    . Shoulder arthroscopy      x2  . Refractive surgery     Family History  Problem Relation Age of Onset  . Alcoholism Father   . Cancer Cousin   . Cancer Cousin   . Cancer Cousin     History   Social History  . Marital Status: Married    Spouse Name: N/A    Number of Children: N/A  . Years of Education: N/A   Occupational History  . Not on  file.   Social History Main Topics  . Smoking status: Former Smoker -- 0.25 packs/day for 44 years    Types: Cigarettes    Quit date: 05/24/2007  . Smokeless tobacco: Never Used  . Alcohol Use: No  . Drug Use: No  . Sexual Activity: Yes   Other Topics Concern  . Not on file   Social History Narrative     Objective: BP 115/59 mmHg  Pulse 74  Wt 246 lb (111.585 kg)  SpO2 98%  General: Alert and Oriented, No Acute Distress HEENT: Pupils equal, round, reactive to light. Conjunctivae clear.  Moist mucous membranes pharynx unremarkable Lungs: Clear to auscultation bilaterally, no wheezing/ronchi/rales.  Comfortable work of breathing. Good air movement. Cardiac: Regular rate and rhythm. Normal S1/S2.  No murmurs, rubs, nor gallops.   Breast: Left breast has no nipple discharge there is no erythema however there is some firmness underneath the left nipple that has improved since I saw him 2 weeks ago. No tenderness nor palpable abnormality elsewhere. No gross overlying skin abnormalities. Extremities: No peripheral edema.  Strong peripheral pulses.  Mental Status: No depression, anxiety, nor agitation. Skin: Warm and dry. Mild to moderate hives on the abdomen back and shoulders.  Assessment & Plan: Calvin Wise was seen today for urticaria.  Diagnoses and associated orders for this visit:  Erectile dysfunction, unspecified  erectile dysfunction type - Discontinue: sildenafil (REVATIO) 20 MG tablet; Take 1-3 tablets (20-60 mg total) by mouth daily as needed (sex). - sildenafil (REVATIO) 20 MG tablet; Take 1-3 tablets (20-60 mg total) by mouth daily as needed (sex).  Allergic reaction, initial encounter  Other Orders - hydrOXYzine (ATARAX/VISTARIL) 25 MG tablet; Take 1-2 tablets (25-50 mg total) by mouth 3 (three) times daily as needed for itching.    Erectile dysfunction: Controlled on as needed generic Viagra, refills provided Allergic reaction to clindamycin, this is been added to  his allergy list. Given 80 mg of Depo-Medrol today to help with itch, provided with as needed hydroxyzine for itch, sedation warning provided. Discussed signs and symptoms of anaphylaxis that would require emergent evaluation fortunately that he peaking at  the worst of the reaction today.  Advised to go through with breast ultrasound even if asymptomatic when PA goes through.   Return if symptoms worsen or fail to improve.

## 2014-08-20 NOTE — Telephone Encounter (Signed)
Patient aware.

## 2014-08-20 NOTE — Addendum Note (Signed)
Addended by: Narda Rutherford on: 08/20/2014 09:30 AM   Modules accepted: Orders

## 2014-08-28 DIAGNOSIS — R928 Other abnormal and inconclusive findings on diagnostic imaging of breast: Secondary | ICD-10-CM | POA: Diagnosis not present

## 2014-08-28 DIAGNOSIS — S2002XD Contusion of left breast, subsequent encounter: Secondary | ICD-10-CM | POA: Diagnosis not present

## 2014-08-28 DIAGNOSIS — N644 Mastodynia: Secondary | ICD-10-CM | POA: Diagnosis not present

## 2014-08-29 ENCOUNTER — Telehealth: Payer: Self-pay | Admitting: Family Medicine

## 2014-08-29 NOTE — Telephone Encounter (Signed)
Seth Bake, Will you please let patient know that his mammogram report showed no signs of cancer or worsening infection.  No further workup is needed as this is very reassuring.

## 2014-08-29 NOTE — Telephone Encounter (Signed)
Pt.notified

## 2014-09-11 ENCOUNTER — Telehealth: Payer: Self-pay | Admitting: *Deleted

## 2014-09-11 NOTE — Telephone Encounter (Signed)
Called patient to remind of lab appt. For 09-12-14, lvm for a return call

## 2014-09-12 DIAGNOSIS — Z87891 Personal history of nicotine dependence: Secondary | ICD-10-CM | POA: Diagnosis not present

## 2014-09-12 DIAGNOSIS — Z79891 Long term (current) use of opiate analgesic: Secondary | ICD-10-CM | POA: Diagnosis not present

## 2014-09-12 DIAGNOSIS — C61 Malignant neoplasm of prostate: Secondary | ICD-10-CM | POA: Diagnosis not present

## 2014-09-12 DIAGNOSIS — E119 Type 2 diabetes mellitus without complications: Secondary | ICD-10-CM | POA: Diagnosis not present

## 2014-09-12 DIAGNOSIS — Z79899 Other long term (current) drug therapy: Secondary | ICD-10-CM | POA: Diagnosis not present

## 2014-09-12 DIAGNOSIS — E669 Obesity, unspecified: Secondary | ICD-10-CM | POA: Diagnosis not present

## 2014-09-12 DIAGNOSIS — H409 Unspecified glaucoma: Secondary | ICD-10-CM | POA: Diagnosis not present

## 2014-09-12 DIAGNOSIS — Z6829 Body mass index (BMI) 29.0-29.9, adult: Secondary | ICD-10-CM | POA: Diagnosis not present

## 2014-09-12 LAB — COMPREHENSIVE METABOLIC PANEL
ALT: 34 U/L (ref 0–53)
ANION GAP: 7 (ref 5–15)
AST: 31 U/L (ref 0–37)
Albumin: 4.3 g/dL (ref 3.5–5.2)
Alkaline Phosphatase: 53 U/L (ref 39–117)
BILIRUBIN TOTAL: 0.6 mg/dL (ref 0.3–1.2)
BUN: 15 mg/dL (ref 6–23)
CALCIUM: 9.6 mg/dL (ref 8.4–10.5)
CO2: 28 mmol/L (ref 19–32)
CREATININE: 1.01 mg/dL (ref 0.50–1.35)
Chloride: 104 mmol/L (ref 96–112)
GFR calc Af Amer: 86 mL/min — ABNORMAL LOW (ref >90)
GFR calc non Af Amer: 74 mL/min — ABNORMAL LOW (ref >90)
GLUCOSE: 130 mg/dL — AB (ref 70–99)
Potassium: 4.3 mmol/L (ref 3.5–5.1)
Sodium: 139 mmol/L (ref 135–145)
TOTAL PROTEIN: 7.4 g/dL (ref 6.0–8.3)

## 2014-09-12 LAB — CBC
HCT: 40.1 % (ref 39.0–52.0)
HEMOGLOBIN: 12.8 g/dL — AB (ref 13.0–17.0)
MCH: 28.4 pg (ref 26.0–34.0)
MCHC: 31.9 g/dL (ref 30.0–36.0)
MCV: 88.9 fL (ref 78.0–100.0)
Platelets: 165 10*3/uL (ref 150–400)
RBC: 4.51 MIL/uL (ref 4.22–5.81)
RDW: 13.5 % (ref 11.5–15.5)
WBC: 6.1 10*3/uL (ref 4.0–10.5)

## 2014-09-12 LAB — PROTIME-INR
INR: 1.04 (ref 0.00–1.49)
PROTHROMBIN TIME: 13.7 s (ref 11.6–15.2)

## 2014-09-12 LAB — APTT: APTT: 39 s — AB (ref 24–37)

## 2014-09-16 ENCOUNTER — Encounter (HOSPITAL_BASED_OUTPATIENT_CLINIC_OR_DEPARTMENT_OTHER): Payer: Self-pay | Admitting: *Deleted

## 2014-09-16 NOTE — Progress Notes (Signed)
To [facility] at 0600-labs,Ekg,Cxr in chart-CBG check on arrival.Instructed Npo after Mn-will complete fleet enema am prior to arrival.

## 2014-09-17 DIAGNOSIS — C61 Malignant neoplasm of prostate: Secondary | ICD-10-CM | POA: Diagnosis not present

## 2014-09-18 ENCOUNTER — Encounter (HOSPITAL_BASED_OUTPATIENT_CLINIC_OR_DEPARTMENT_OTHER): Payer: Self-pay | Admitting: Anesthesiology

## 2014-09-18 ENCOUNTER — Telehealth: Payer: Self-pay | Admitting: *Deleted

## 2014-09-18 NOTE — Telephone Encounter (Signed)
Called patient to remind of implant for 09-19-14, spoke with patient and he is aware of this implant

## 2014-09-18 NOTE — Anesthesia Preprocedure Evaluation (Addendum)
Anesthesia Evaluation  Patient identified by MRN, date of birth, ID band Patient awake    Reviewed: Allergy & Precautions, NPO status , Patient's Chart, lab work & pertinent test results  Airway Mallampati: II  TM Distance: >3 FB Neck ROM: Full   Comment: TMJ Dental  (+) Edentulous Upper Few remaining lower teeth. Denies loose teeth.:   Pulmonary former smoker,  breath sounds clear to auscultation  Pulmonary exam normal       Cardiovascular Exercise Tolerance: Good Rhythm:Regular Rate:Normal  H/O irregular heartbeat (declines workup)  ECG: SB 55 with PACs  Patient denies hypertension. He states when he was on steroids his BP was up, but otherwise no hypertension.   Neuro/Psych Sciatica  Neuromuscular disease negative psych ROS   GI/Hepatic negative GI ROS, Neg liver ROS,   Endo/Other  diabetes, Type 2, Oral Hypoglycemic Agents  Renal/GU negative Renal ROS  negative genitourinary   Musculoskeletal negative musculoskeletal ROS (+)   Abdominal (+) + obese,   Peds negative pediatric ROS (+)  Hematology negative hematology ROS (+)   Anesthesia Other Findings   Reproductive/Obstetrics negative OB ROS                           Anesthesia Physical Anesthesia Plan  ASA: II  Anesthesia Plan: General   Post-op Pain Management:    Induction: Intravenous  Airway Management Planned: LMA  Additional Equipment:   Intra-op Plan:   Post-operative Plan: Extubation in OR  Informed Consent: I have reviewed the patients History and Physical, chart, labs and discussed the procedure including the risks, benefits and alternatives for the proposed anesthesia with the patient or authorized representative who has indicated his/her understanding and acceptance.   Dental advisory given  Plan Discussed with: CRNA  Anesthesia Plan Comments: (LMA versus ETT)        Anesthesia Quick Evaluation

## 2014-09-19 ENCOUNTER — Encounter (HOSPITAL_BASED_OUTPATIENT_CLINIC_OR_DEPARTMENT_OTHER): Payer: Self-pay | Admitting: *Deleted

## 2014-09-19 ENCOUNTER — Ambulatory Visit (HOSPITAL_BASED_OUTPATIENT_CLINIC_OR_DEPARTMENT_OTHER)
Admission: RE | Admit: 2014-09-19 | Discharge: 2014-09-19 | Disposition: A | Discharge status: Home / Self Care | Payer: Medicare Other | Source: Ambulatory Visit | Attending: Urology | Admitting: Urology

## 2014-09-19 ENCOUNTER — Ambulatory Visit (HOSPITAL_COMMUNITY): Payer: Medicare Other

## 2014-09-19 ENCOUNTER — Ambulatory Visit (HOSPITAL_BASED_OUTPATIENT_CLINIC_OR_DEPARTMENT_OTHER): Payer: Medicare Other | Admitting: Anesthesiology

## 2014-09-19 ENCOUNTER — Encounter (HOSPITAL_BASED_OUTPATIENT_CLINIC_OR_DEPARTMENT_OTHER)
Admission: RE | Disposition: A | Discharge status: Home / Self Care | Payer: Self-pay | Source: Ambulatory Visit | Attending: Urology

## 2014-09-19 DIAGNOSIS — E669 Obesity, unspecified: Secondary | ICD-10-CM | POA: Diagnosis not present

## 2014-09-19 DIAGNOSIS — M75102 Unspecified rotator cuff tear or rupture of left shoulder, not specified as traumatic: Secondary | ICD-10-CM

## 2014-09-19 DIAGNOSIS — E119 Type 2 diabetes mellitus without complications: Secondary | ICD-10-CM | POA: Insufficient documentation

## 2014-09-19 DIAGNOSIS — C61 Malignant neoplasm of prostate: Secondary | ICD-10-CM

## 2014-09-19 DIAGNOSIS — Z79891 Long term (current) use of opiate analgesic: Secondary | ICD-10-CM | POA: Diagnosis not present

## 2014-09-19 DIAGNOSIS — Z87891 Personal history of nicotine dependence: Secondary | ICD-10-CM | POA: Insufficient documentation

## 2014-09-19 DIAGNOSIS — H409 Unspecified glaucoma: Secondary | ICD-10-CM | POA: Insufficient documentation

## 2014-09-19 DIAGNOSIS — Z6829 Body mass index (BMI) 29.0-29.9, adult: Secondary | ICD-10-CM | POA: Diagnosis not present

## 2014-09-19 DIAGNOSIS — Z79899 Other long term (current) drug therapy: Secondary | ICD-10-CM | POA: Diagnosis not present

## 2014-09-19 HISTORY — DX: Personal history of other diseases of the digestive system: Z87.19

## 2014-09-19 HISTORY — PX: RADIOACTIVE SEED IMPLANT: SHX5150

## 2014-09-19 HISTORY — DX: Type 2 diabetes mellitus without complications: E11.9

## 2014-09-19 LAB — GLUCOSE, CAPILLARY
Glucose-Capillary: 119 mg/dL — ABNORMAL HIGH (ref 70–99)
Glucose-Capillary: 119 mg/dL — ABNORMAL HIGH (ref 70–99)

## 2014-09-19 SURGERY — INSERTION, RADIATION SOURCE, PROSTATE
Anesthesia: General | Site: Prostate

## 2014-09-19 MED ORDER — TAMSULOSIN HCL 0.4 MG PO CAPS
ORAL_CAPSULE | ORAL | Status: AC
  Filled 2014-09-19: qty 1

## 2014-09-19 MED ORDER — PROMETHAZINE HCL 25 MG/ML IJ SOLN
6.2500 mg | INTRAMUSCULAR | Status: DC | PRN
  Filled 2014-09-19: qty 1

## 2014-09-19 MED ORDER — MIDAZOLAM HCL 2 MG/2ML IJ SOLN
INTRAMUSCULAR | Status: AC
  Filled 2014-09-19: qty 2

## 2014-09-19 MED ORDER — FENTANYL CITRATE 0.05 MG/ML IJ SOLN
INTRAMUSCULAR | Status: AC
  Filled 2014-09-19: qty 4

## 2014-09-19 MED ORDER — ONDANSETRON HCL 4 MG/2ML IJ SOLN
INTRAMUSCULAR | Status: DC | PRN
  Administered 2014-09-19: 4 mg via INTRAVENOUS

## 2014-09-19 MED ORDER — GLYCOPYRROLATE 0.2 MG/ML IJ SOLN
INTRAMUSCULAR | Status: DC | PRN
  Administered 2014-09-19: 0.2 mg via INTRAVENOUS

## 2014-09-19 MED ORDER — SUCCINYLCHOLINE CHLORIDE 20 MG/ML IJ SOLN
INTRAMUSCULAR | Status: DC | PRN
  Administered 2014-09-19: 100 mg via INTRAVENOUS

## 2014-09-19 MED ORDER — LIDOCAINE HCL (CARDIAC) 20 MG/ML IV SOLN
INTRAVENOUS | Status: DC | PRN
  Administered 2014-09-19: 100 mg via INTRAVENOUS

## 2014-09-19 MED ORDER — DOCUSATE SODIUM 100 MG PO CAPS: 100.0000 mg | ORAL_CAPSULE | Freq: Two times a day (BID) | ORAL | Status: DC | PRN

## 2014-09-19 MED ORDER — STERILE WATER FOR IRRIGATION IR SOLN
Status: DC | PRN
  Administered 2014-09-19: 3000 mL via INTRAVESICAL

## 2014-09-19 MED ORDER — TAMSULOSIN HCL 0.4 MG PO CAPS: 0.4000 mg | ORAL_CAPSULE | Freq: Every day | ORAL | Status: DC

## 2014-09-19 MED ORDER — KETOROLAC TROMETHAMINE 30 MG/ML IJ SOLN
INTRAMUSCULAR | Status: DC | PRN
  Administered 2014-09-19: 15 mg via INTRAVENOUS

## 2014-09-19 MED ORDER — FLEET ENEMA 7-19 GM/118ML RE ENEM
1.0000 | ENEMA | Freq: Once | RECTAL | Status: DC
  Filled 2014-09-19: qty 1

## 2014-09-19 MED ORDER — LACTATED RINGERS IV SOLN
INTRAVENOUS | Status: DC
  Administered 2014-09-19: 07:00:00 via INTRAVENOUS
  Filled 2014-09-19: qty 1000

## 2014-09-19 MED ORDER — STERILE WATER FOR IRRIGATION IR SOLN
Status: DC | PRN
  Administered 2014-09-19: 500 mL

## 2014-09-19 MED ORDER — HYDROCODONE-ACETAMINOPHEN 7.5-500 MG PO TABS: ORAL_TABLET | ORAL | Status: DC

## 2014-09-19 MED ORDER — CIPROFLOXACIN HCL 500 MG PO TABS
500.0000 mg | ORAL_TABLET | Freq: Two times a day (BID) | ORAL | Status: AC
Start: 2014-09-19 — End: 2014-09-21

## 2014-09-19 MED ORDER — MIDAZOLAM HCL 5 MG/5ML IJ SOLN
INTRAMUSCULAR | Status: DC | PRN
  Administered 2014-09-19: 2 mg via INTRAVENOUS

## 2014-09-19 MED ORDER — DEXAMETHASONE SODIUM PHOSPHATE 4 MG/ML IJ SOLN
INTRAMUSCULAR | Status: DC | PRN
  Administered 2014-09-19: 10 mg via INTRAVENOUS

## 2014-09-19 MED ORDER — TAMSULOSIN HCL 0.4 MG PO CAPS
0.4000 mg | ORAL_CAPSULE | Freq: Every day | ORAL | Status: DC
  Administered 2014-09-19: 0.4 mg via ORAL
  Filled 2014-09-19: qty 1

## 2014-09-19 MED ORDER — FENTANYL CITRATE 0.05 MG/ML IJ SOLN
INTRAMUSCULAR | Status: DC | PRN
  Administered 2014-09-19 (×2): 50 ug via INTRAVENOUS

## 2014-09-19 MED ORDER — ACETAMINOPHEN 10 MG/ML IV SOLN
INTRAVENOUS | Status: DC | PRN
  Administered 2014-09-19: 1000 mg via INTRAVENOUS

## 2014-09-19 MED ORDER — CIPROFLOXACIN IN D5W 400 MG/200ML IV SOLN
INTRAVENOUS | Status: AC
  Filled 2014-09-19: qty 200

## 2014-09-19 MED ORDER — FENTANYL CITRATE 0.05 MG/ML IJ SOLN
25.0000 ug | INTRAMUSCULAR | Status: DC | PRN
  Filled 2014-09-19: qty 1

## 2014-09-19 MED ORDER — SODIUM CHLORIDE 0.9 % IR SOLN: Status: DC | PRN

## 2014-09-19 MED ORDER — PROPOFOL 10 MG/ML IV BOLUS
INTRAVENOUS | Status: DC | PRN
  Administered 2014-09-19: 300 mg via INTRAVENOUS
  Administered 2014-09-19 (×2): 50 mg via INTRAVENOUS
  Administered 2014-09-19: 100 mg via INTRAVENOUS

## 2014-09-19 MED ORDER — CIPROFLOXACIN IN D5W 400 MG/200ML IV SOLN
400.0000 mg | INTRAVENOUS | Status: AC
  Administered 2014-09-19: 400 mg via INTRAVENOUS
  Filled 2014-09-19: qty 200

## 2014-09-19 MED ORDER — IOHEXOL 350 MG/ML SOLN
INTRAVENOUS | Status: DC | PRN
  Administered 2014-09-19: 7 mL

## 2014-09-19 SURGICAL SUPPLY — 29 items
BAG URINE DRAINAGE (UROLOGICAL SUPPLIES) ×3 IMPLANT
BLADE CLIPPER SURG (BLADE) ×3 IMPLANT
CATH FOLEY 2WAY SLVR  5CC 16FR (CATHETERS) ×4
CATH FOLEY 2WAY SLVR 5CC 16FR (CATHETERS) ×2 IMPLANT
CATH ROBINSON RED A/P 20FR (CATHETERS) ×3 IMPLANT
CLOTH BEACON ORANGE TIMEOUT ST (SAFETY) ×3 IMPLANT
COVER MAYO STAND STRL (DRAPES) ×3 IMPLANT
COVER TABLE BACK 60X90 (DRAPES) ×3 IMPLANT
DRSG TEGADERM 4X4.75 (GAUZE/BANDAGES/DRESSINGS) ×3 IMPLANT
DRSG TEGADERM 8X12 (GAUZE/BANDAGES/DRESSINGS) ×3 IMPLANT
GLOVE BIO SURGEON STRL SZ 6.5 (GLOVE) ×2 IMPLANT
GLOVE BIO SURGEON STRL SZ7.5 (GLOVE) ×6 IMPLANT
GLOVE BIO SURGEONS STRL SZ 6.5 (GLOVE) ×1
GLOVE BIOGEL PI IND STRL 6.5 (GLOVE) ×1 IMPLANT
GLOVE BIOGEL PI INDICATOR 6.5 (GLOVE) ×2
GLOVE ECLIPSE 8.0 STRL XLNG CF (GLOVE) ×9 IMPLANT
GLOVE INDICATOR 7.5 STRL GRN (GLOVE) ×3 IMPLANT
GOWN STRL REUS W/ TWL LRG LVL3 (GOWN DISPOSABLE) ×1 IMPLANT
GOWN STRL REUS W/TWL LRG LVL3 (GOWN DISPOSABLE) ×2
GOWN STRL REUS W/TWL XL LVL3 (GOWN DISPOSABLE) ×6 IMPLANT
HOLDER FOLEY CATH W/STRAP (MISCELLANEOUS) IMPLANT
PACK CYSTO (CUSTOM PROCEDURE TRAY) ×3 IMPLANT
PLUG CATH AND CAP STER (CATHETERS) ×3 IMPLANT
SPONGE GAUZE 4X4 12PLY STER LF (GAUZE/BANDAGES/DRESSINGS) ×3 IMPLANT
SYRINGE 10CC LL (SYRINGE) ×6 IMPLANT
UNDERPAD 30X30 INCONTINENT (UNDERPADS AND DIAPERS) ×6 IMPLANT
WATER STERILE IRR 3000ML UROMA (IV SOLUTION) ×3 IMPLANT
WATER STERILE IRR 500ML POUR (IV SOLUTION) ×3 IMPLANT
selectSeed I-125 ×207 IMPLANT

## 2014-09-19 NOTE — Discharge Instructions (Signed)
DISCHARGE INSTRUCTIONS FOR PROSTATE SEED IMPLANTATION ° °Antibiotics °You may be given a prescription for an antibiotic to take when you arrive home. If so, be sure to take every tablet in the bottle, even if you are feeling better before the prescription is finished. If you begin itching, notice a rash or start to swell on your trunk, arms, legs and/or throat, immediately stop taking the antibiotic and call your Urologist. °Diet °Resume your usual diet when you return home. To keep your bowels moving easily and softly, drink prune, apple and cranberry juice at room temperature. You may also take a stool softener, such as Colace, which is available without prescription at local pharmacies. °Daily activities °? No driving or heavy lifting for at least two days after the implant. °? No bike riding, horseback riding or riding lawn mowers for the first month after the implant. °? Any strenuous physical activity should be approved by your doctor before you resume it. °Sexual relations °You may resume sexual relations two weeks after the procedure. A condom should be used for the first two weeks. Your semen may be dark brown or black; this is normal and is related bleeding that may have occurred during the implant. °Postoperative swelling °Expect swelling and bruising of the scrotum and perineum (the area between the scrotum and anus). Both the swelling and the bruising should resolve in l or 2 weeks. Ice packs and over- the-counter medications such as Tylenol, Advil or Aleve may lessen your discomfort. °Postoperative urination °Most men experience burning on urination and/or urinary frequency. If this becomes bothersome, contact your Urologist.  Medication can be prescribed to relieve these problems.  It is normal to have some blood in your urine for a few days after the implant. °Special instructions related to the seeds °It is unlikely that you will pass an Iodine-125 seed in your urine. The seeds are silver in color  and are about as large as a grain of rice. If you pass a seed, do not handle it with your fingers. Use a spoon to place it in an envelope or jar in place this in base occluded area such as the garage or basement for return to the radiation clinic at your convenience. ° °Contact your doctor for °? Temperature greater than 101 F °? Increasing pain °? Inability to urinate °Follow-up ° You should have follow up with your urologist and radiation oncologist about 3 weeks after the procedure. °General information regarding Iodine seeds °? Iodine-125 is a low energy radioactive material. It is not deeply penetrating and loses energy at short distances. Your prostate will absorb the radiation. Objects that are touched or used by the patient do not become radioactive. °? Body wastes (urine and stool) or body fluids (saliva, tears, semen or blood) are not radioactive. °? The Nuclear Regulatory Commission (NRC) has determined that no radiation precautions are needed for patients undergoing Iodine-125 seed implantation. The NRC states that such patients do not present a risk to the people around them, including young children and pregnant women. However, in keeping with the general principle that radiation exposure should be kept as low reasonably possible, we suggest the following: °? Children and pets should not sit on the patient's lap for the first two (2) weeks after the implant. °? Pregnant (or possibly pregnant) women should avoid prolonged, close contact with the patient for the first two (2) weeks after the implant. °? A distance of three (3) feet is acceptable. °? At a distance of three (3)   with the patient.  Post Anesthesia Home Care Instructions  Activity: Get plenty of rest for the remainder of the day. A responsible adult should stay with you for 24 hours following the procedure.  For the next 24 hours, DO NOT: -Drive a car -Operate machinery -Drink alcoholic beverages -Take  any medication unless instructed by your physician -Make any legal decisions or sign important papers.  Meals: Start with liquid foods such as gelatin or soup. Progress to regular foods as tolerated. Avoid greasy, spicy, heavy foods. If nausea and/or vomiting occur, drink only clear liquids until the nausea and/or vomiting subsides. Call your physician if vomiting continues.  Special Instructions/Symptoms: Your throat may feel dry or sore from the anesthesia or the breathing tube placed in your throat during surgery. If this causes discomfort, gargle with warm salt water. The discomfort should disappear within 24 hours.     

## 2014-09-19 NOTE — H&P (Signed)
Reason For Visit CaP discussion   History of Present Illness     79M referred by Dr. Marcial Pacas, DO for eval and management of an elevated PSA.  Patient was found to have an elevated PSA which was drawn as part of a prostate cancer screening. He has no family history of prostate cancer. The patient denies any bone pain, new back pain, or lower extremity edema. The patient denies any changes in his voiding symptoms over the last 6 months. Specifically he denies dysuria or hematuria.    PSA History:  5.31 on 03/2014  4.54 on 09/2013  IPSS: 0, 0  SHIM: 23     Prostate cancer profile  Stage: T1c  PSA: 5.31  Biopsy , 5/12 cores positive: LLB 3+4 =7 (40%), LLM 3+3=6 (25%), LLA 3+3=6 (20%), LMB 3+3=6 (90%), RLM 3+3=6  Prostate volume: 36gm with median lobe    Prostate cancer nomogram after radical prostatectomy:  OC- 45%  ECE- 54%  SVI- 3  LNI -3  PFS (surgery)- 96% (8yr)/ 92 (79yr)  Interval: The patient has recovered well from his prostate biopsy. He denies any ongoing hematuria. He had no evidence of infection including fever or chills. He presents today for discussion of prostate cancer treatment options.   Past Medical History Problems  1. History of glaucoma (D53.29)  Surgical History Problems  1. History of Back Surgery 2. History of Shoulder Surgery 3. History of Umbilical Hernia Repair  Current Meds 1. Alphagan P 0.15 % Ophthalmic Solution;  Therapy: (Recorded:20Oct2015) to Recorded 2. Dorzolamide HCl - 2 % Ophthalmic Solution;  Therapy: (Recorded:20Oct2015) to Recorded 3. Hydrocodone-Acetaminophen 5-325 MG Oral Tablet;  Therapy: (Recorded:20Oct2015) to Recorded 4. Latanoprost 0.005 % Ophthalmic Solution;  Therapy: (Recorded:20Oct2015) to Recorded 5. Levofloxacin 500 MG Oral Tablet; Take one tablet daily starting day before procedure;  Therapy: 20Oct2015 to (Evaluate:23Oct2015); Last Rx:20Oct2015 Ordered 6. MetFORMIN HCl - 1000 MG Oral Tablet;  Therapy: (Recorded:20Oct2015) to Recorded 7. Sildenafil Citrate 20 MG Oral Tablet;  Therapy: (Recorded:20Oct2015) to Recorded  Allergies Medication  1. No Known Drug Allergies  Family History Problems  1. No pertinent family history : Mother  Social History Problems  1. Denied: History of Alcohol use 2. Denied: History of Caffeine use 3. Former smoker [phone])   <1/2 ppd for 20+ years and quit in 2006 4. Married 5. Number of children   1 son and 3 daughter 60. Occupation   Freight forwarder  Results/Data No urinalysis was obtained     Assessment Assessed  1. Prostate cancer (C61)  T1c Gleason 3+4 = 7, PSA 5.3, intermediate risk prostate cancer. The patient has minimal voiding symptoms and good erectile function.   Plan Prostate cancer  1. Radiation Oncology Referral Referral  Referral for consideration of radiation therapy for  his intermediate risk prostate cancer.  Status: Hold For - Appointment,Records   Requested for: [id number] 2. Follow-up After Referral Office  Follow-up  Status: Hold For - Appointment,Date of Service   Requested for: [id number]  Discussion/Summary We discussed prostatectomy and specifically robotic prostatectomy with bilateral pelvic lymphadenectomy being the technique that I most commonly perform. I showed the patient on their abdomen the approximately 6 small incision (trocar) sites as well as presumed extraction sites with robotic approach as well as possible open incision sites should open conversion be necessary. We discussed peri-operative risks including bleeding, infection, deep vein thrombosis, pulmonary embolism, compartment syndrome, nuropathy / neuropraxia, heart attack, stroke, death, as well as long-term risks such as non-cure / need for additional  therapy. We specifically addressed that the procedure would compromise urinary control leading to stress incontinence which typically resolves with time and pelvic rehabilitation (Kegel's, etc..),  but can sometimes be permanent and require additional therapy including surgery. We also specifically addressed sexual sequellae including significant erectile dysfunction which typically partially resolves with time but can also be permanent and require additional therapy including surgery.   The patient is interested in discussing radiation treatment options. As such, I will refer the patient to the radiation oncologist for further discussion.  A total of 40 minutes were spent in the overall care of the patient today with 35 minutes in direct face to face consultation.

## 2014-09-19 NOTE — Op Note (Signed)
Preoperative diagnosis: Clinical stage TI C adenocarcinoma the prostate  Postoperative diagnosis: Same  Procedure: I-125 prostate seed implantation with Nucletron robotic implanter  Surgeon: Ardis Hughs M.D. , Tyler Pita, M.D.  Anesthesia: Gen.  Indications: Patient  was diagnosed with clinical stage TI C prostate cancer. We had extensive discussion with him about treatment options versus. He elected to proceed with seed implantation. He underwent consultation my office as well as with Dr. Tyler Pita. He appeared to understand the advantages disadvantages potential risks of this treatment option. Full informed consent has been obtained. The patient is had preoperative ciprofloxacin. PAS compression boots were placed.  Technique and findings: Patient was brought the operating room where he had successful induction of general anesthesia. He was placed in lithotomy position and prepped and draped in usual manner. Appropriate surgical timeout was performed. Radiation oncology department placed a transrectal ultrasound probe anchoring stand. Foley catheter with contrast in the balloon was inserted without difficulty. Anchoring needles were placed within the prostate. Real-time contouring of the urethra prostate and rectum were performed and the dosing parameters were established. Targeted dose was 145 gray. We then came to the operating suite suite for placement of the needles. A second timeout was performed. All needle passage was done with real-time transrectal ultrasound guidance with the sagittal plane. A total of 25 needles were placed. See implantation itself was done with the robotic implanter. 69 active seeds were implanted. A Foley catheter was removed and flexible cystoscopy failed to show any seeds outside the prostate. The bladder was then drained and no catheter was left. The patient was brought to recovery room in stable condition.

## 2014-09-19 NOTE — Anesthesia Postprocedure Evaluation (Signed)
  Anesthesia Post-op Note  Patient: Calvin Wise  Procedure(s) Performed: Procedure(s) (LRB): RADIOACTIVE SEED IMPLANT (N/A)  Patient Location: PACU  Anesthesia Type: general  Level of Consciousness: awake and alert   Airway and Oxygen Therapy: Patient Spontanous Breathing  Post-op Pain: mild  Post-op Assessment: Post-op Vital signs reviewed, Patient's Cardiovascular Status Stable, Respiratory Function Stable, Patent Airway and No signs of Nausea or vomiting  Last Vitals:  Filed Vitals:   09/19/14 1105  BP: 132/62  Pulse: 52  Temp: 36.8 C  Resp: 16    Post-op Vital Signs: stable   Complications: No apparent anesthesia complications

## 2014-09-19 NOTE — Transfer of Care (Signed)
Immediate Anesthesia Transfer of Care Note  Patient: Calvin Wise  Procedure(s) Performed: Procedure(s): RADIOACTIVE SEED IMPLANT (N/A)  Patient Location: PACU  Anesthesia Type:General  Level of Consciousness: sedated and responds to stimulation  Airway & Oxygen Therapy: Patient Spontanous Breathing and Patient connected to nasal cannula oxygen  Post-op Assessment: Report given to RN and Post -op Vital signs reviewed and stable  Post vital signs: Reviewed and stable  Last Vitals:  Filed Vitals:   09/19/14 0621  BP: 150/66  Pulse: 56  Temp: 36.8 C  Resp: 16    Complications: No apparent anesthesia complications

## 2014-09-19 NOTE — Anesthesia Procedure Notes (Addendum)
Procedure Name: LMA Insertion Date/Time: 09/19/2014 7:49 AM Performed by: Bethena Roys T Pre-anesthesia Checklist: Patient identified, Emergency Drugs available, Suction available and Patient being monitored Patient Re-evaluated:Patient Re-evaluated prior to inductionOxygen Delivery Method: Circle System Utilized Preoxygenation: Pre-oxygenation with 100% oxygen Intubation Type: IV induction Ventilation: Mask ventilation without difficulty LMA: LMA with gastric port inserted LMA Size: 5.0 Number of attempts: 1 Placement Confirmation: positive ETCO2 Tube secured with: Tape Dental Injury: Teeth and Oropharynx as per pre-operative assessment    Date/Time: 09/19/2014 8:11 AM Performed by: Bethena Roys T Oxygen Delivery Method: Circle system utilized Laryngoscope Size: Mac and 4 Grade View: Grade I Tube type: Oral Tube size: 8.0 mm Number of attempts: 1 Airway Equipment and Method: Stylet Secured at: 23 cm Tube secured with: Tape Dental Injury: Teeth and Oropharynx as per pre-operative assessment  Comments: LMA removed, easy intubation

## 2014-09-19 NOTE — Interval H&P Note (Signed)
History and Physical Interval Note:  09/19/2014 7:39 AM  Calvin Wise  has presented today for surgery, with the diagnosis of PROSTATE CANCER  The various methods of treatment have been discussed with the patient and family. After consideration of risks, benefits and other options for treatment, the patient has consented to  Procedure(s): RADIOACTIVE SEED IMPLANT (N/A) as a surgical intervention .  The patient's history has been reviewed, patient examined, no change in status, stable for surgery.  I have reviewed the patient's chart and labs.  Questions were answered to the patient's satisfaction.     Louis Meckel W

## 2014-09-21 NOTE — Procedures (Signed)
  Radiation Oncology         [phone] ________________________________  Name: Calvin Wise MRN: [id number]  Date: 09/19/14  DOB: May 11, 1946       Prostate Seed Implant  SP:QZRAQT, Sean, DO  No ref. provider found  DIAGNOSIS: 69 y.o. gentleman with stage T1c adenocarcinoma of the prostate with a Gleason's score of 3+4 and a PSA of 5.31     ICD-9-CM ICD-10-CM   1. Left rotator cuff tear 840.4 M75.102 HYDROcodone-acetaminophen (LORTAB) 7.5-500 MG per tablet  2. Prostate cancer 5 C61 DG Chest 2 View     DG Chest 2 View     Discharge patient     CANCELED: Care order/instruction    PROCEDURE: Insertion of radioactive I-125 seeds into the prostate gland.  RADIATION DOSE: 145 Gy, definitive therapy.  TECHNIQUE: CALVERT CHARLAND was brought to the operating room with the urologist. He was placed in the dorsolithotomy position. He was catheterized and a rectal tube was inserted. The perineum was shaved, prepped and draped. The ultrasound probe was then introduced into the rectum to see the prostate gland.  TREATMENT DEVICE: A needle grid was attached to the ultrasound probe stand and anchor needles were placed.  3D PLANNING: The prostate was imaged in 3D using a sagittal sweep of the prostate probe. These images were transferred to the planning computer. There, the prostate, urethra and rectum were defined on each axial reconstructed image. Then, the software created an optimized 3D plan and a few seed positions were adjusted. The quality of the plan was reviewed using [facility] information for the target and the following two organs at risk:  Urethra and Rectum.  Then the accepted plan was uploaded to the seed Selectron afterloading unit.  PROSTATE VOLUME STUDY:  Using transrectal ultrasound the volume of the prostate was verified to be 38.97 cc.  SPECIAL TREATMENT PROCEDURE/SUPERVISION AND HANDLING: The Nucletron FIRST system was used to place the needles under sagittal guidance. A  total of 25 needles were used to deposit 69 seeds in the prostate gland. The individual seed activity was 0.459 mCi for a total implant activity of 31.671 mCi.  COMPLEX SIMULATION: At the end of the procedure, an anterior radiograph of the pelvis was obtained to document seed positioning and count. Cystoscopy was performed to check the urethra and bladder.  MICRODOSIMETRY: At the end of the procedure, the patient was emitting 0.12 mR/hr at 1 meter. Accordingly, he was considered safe for hospital discharge.  PLAN: The patient will return to the radiation oncology clinic for post implant CT dosimetry in three weeks.   ________________________________  Sheral Apley Tammi Klippel, M.D.

## 2014-09-22 ENCOUNTER — Encounter (HOSPITAL_BASED_OUTPATIENT_CLINIC_OR_DEPARTMENT_OTHER): Payer: Self-pay | Admitting: Urology

## 2014-10-08 ENCOUNTER — Telehealth: Payer: Self-pay | Admitting: *Deleted

## 2014-10-08 NOTE — Telephone Encounter (Signed)
CALLED PATIENT TO REMIND OF POST SEED APPTS., SPOKE WITH PATIENT AND HE IS AWARE OF THESE APPTS. 

## 2014-10-09 ENCOUNTER — Ambulatory Visit
Admit: 2014-10-09 | Discharge: 2014-10-09 | Disposition: A | Discharge status: Home / Self Care | Payer: Medicare Other | Source: Ambulatory Visit | Attending: Radiation Oncology | Admitting: Radiation Oncology

## 2014-10-09 ENCOUNTER — Ambulatory Visit
Admit: 2014-10-09 | Discharge: 2014-10-09 | Disposition: A | Discharge status: Home / Self Care | Payer: Medicare Other | Attending: Radiation Oncology | Admitting: Radiation Oncology

## 2014-10-09 ENCOUNTER — Encounter: Payer: Self-pay | Admitting: Radiation Oncology

## 2014-10-09 VITALS — BP 150/75 | HR 56 | Temp 98.0°F | Resp 16 | Wt 238.0 lb

## 2014-10-09 DIAGNOSIS — C61 Malignant neoplasm of prostate: Secondary | ICD-10-CM | POA: Insufficient documentation

## 2014-10-09 NOTE — Progress Notes (Signed)
  Radiation Oncology         [phone] ________________________________  Name: Calvin Wise MRN: [id number]  Date: 10/09/2014  DOB: 19-Apr-1946  COMPLEX SIMULATION NOTE  NARRATIVE:  The patient was brought to the [facility] suite today following prostate seed implantation approximately one month ago.  Identity was confirmed.  All relevant records and images related to the planned course of therapy were reviewed.  Then, the patient was set-up supine.  CT images were obtained.  The CT images were loaded into the planning software.  Then the prostate and rectum were contoured.  Treatment planning then occurred.  The implanted iodine 125 seeds were identified by the physics staff for projection of radiation distribution  I have requested : 3D Simulation  I have requested a DVH of the following structures: Prostate and rectum.    ________________________________  Sheral Apley Tammi Klippel, M.D.

## 2014-10-09 NOTE — Progress Notes (Signed)
IPSS 0. Denies nocturia. Reports hematuria and dysuria resolved 24 hours after surgery. Describes a strong urine stream. Denies difficulty emptying his bladder, urgency or frequency. Weight and vitals stable.

## 2014-10-09 NOTE — Progress Notes (Signed)
Radiation Oncology         [phone] ________________________________  Name: Calvin Wise MRN: [id number]  Date: 10/09/2014  DOB: [birth year]  Follow-Up Visit Note  CC: Marcial Pacas, DO  Ardis Hughs, MD  Diagnosis:   69 y.o. gentleman with stage T1c adenocarcinoma of the prostate with a Gleason's score of 3+4 and a PSA of 5.31     ICD-9-CM ICD-10-CM   1. Prostate cancer 185 C61     Interval Since Last Radiation:  3  weeks  Narrative:  The patient returns today for routine follow-up.  He is complaining of increased urinary frequency and urinary hesitation symptoms. He filled out a questionnaire regarding urinary function today providing and overall IPSS score of zero characterizing his symptoms as mild.  His pre-implant score was also zero. He denies any bowel symptoms.  ALLERGIES:  is allergic to aspirin and clindamycin/lincomycin.  Meds: Current Outpatient Prescriptions  Medication Sig Dispense Refill  . brimonidine (ALPHAGAN P) 0.1 % SOLN 1 drop.    . cetirizine (ZYRTEC) 10 MG tablet Take 10 mg by mouth daily.    . clindamycin (CLEOCIN) 300 MG capsule   0  . docusate sodium (COLACE) 100 MG capsule Take 1 capsule (100 mg total) by mouth 2 (two) times daily as needed (take to keep stool soft.). 60 capsule 0  . dorzolamide-timolol (COSOPT) 22.3-6.8 MG/ML ophthalmic solution 1 drop.    . hydrOXYzine (ATARAX/VISTARIL) 25 MG tablet Take 1-2 tablets (25-50 mg total) by mouth 3 (three) times daily as needed for itching. 30 tablet 0  . metFORMIN (GLUCOPHAGE) 1000 MG tablet One tablet by mouth every evening for blood sugar control. 30 tablet 5  . sildenafil (REVATIO) 20 MG tablet Take 1-3 tablets (20-60 mg total) by mouth daily as needed (sex). 50 tablet 2  . tamsulosin (FLOMAX) 0.4 MG CAPS capsule Take 1 capsule (0.4 mg total) by mouth daily. 30 capsule 0  . Travoprost (TRAVATAN OP) Apply to eye.    Marland Kitchen amitriptyline (ELAVIL) 25 MG tablet Take 1 tablet (25 mg total) by mouth  at bedtime. (Patient not taking: Reported on 10/09/2014) 30 tablet 0  . cephALEXin (KEFLEX) 500 MG capsule   0  . HYDROcodone-acetaminophen (LORTAB) 7.5-500 MG per tablet Prostate pain (Patient not taking: Reported on 10/09/2014) 30 tablet 0  . HYDROcodone-acetaminophen (NORCO/VICODIN) 5-325 MG per tablet Take by mouth. for pain  0  . traMADol (ULTRAM) 50 MG tablet   0   No current facility-administered medications for this encounter.    Physical Findings: The patient is in no acute distress. Patient is alert and oriented.  weight is 238 lb (107.956 kg). His oral temperature is 98 F (36.7 C). His blood pressure is 150/75 and his pulse is 56. His respiration is 16 and oxygen saturation is 100%. .  No significant changes.  Lab Findings: Lab Results  Component Value Date   WBC 6.1 09/12/2014   HGB 12.8* 09/12/2014   HCT 40.1 09/12/2014   MCV 88.9 09/12/2014   PLT 165 09/12/2014    Radiographic Findings:  Patient underwent CT imaging in our clinic for post implant dosimetry. The CT appears to demonstrate an adequate distribution of radioactive seeds throughout the prostate gland. There no seeds in her near the rectum. I suspect the final radiation plan and dosimetry will show appropriate coverage of the prostate gland.   Impression: The patient is recovering from the effects of radiation. His urinary symptoms should gradually improve over the next 4-6 months.  We talked about this today. He is encouraged by his improvement already and is otherwise please with his outcome.   Plan: Today, I spent time talking to the patient about his prostate seed implant and resolving urinary symptoms. We also talked about long-term follow-up for prostate cancer following seed implant. He understands that ongoing PSA determinations and digital rectal exams will help perform surveillance to rule out disease recurrence. He understands what to expect with his PSA measures. Patient was also educated today about  some of the long-term effects from radiation including a small risk for rectal bleeding and possibly erectile dysfunction. We talked about some of the general management approaches to these potential complications. However, I did encourage the patient to contact our office or return at any point if he has questions or concerns related to his previous radiation and prostate cancer.  _____________________________________  Sheral Apley. Tammi Klippel, M.D.

## 2014-10-10 DIAGNOSIS — C61 Malignant neoplasm of prostate: Secondary | ICD-10-CM | POA: Diagnosis not present

## 2014-10-15 ENCOUNTER — Encounter: Payer: Self-pay | Admitting: Family Medicine

## 2014-10-28 ENCOUNTER — Encounter: Payer: Self-pay | Admitting: Radiation Oncology

## 2014-10-28 DIAGNOSIS — C61 Malignant neoplasm of prostate: Secondary | ICD-10-CM | POA: Diagnosis not present

## 2014-10-29 ENCOUNTER — Ambulatory Visit (INDEPENDENT_AMBULATORY_CARE_PROVIDER_SITE_OTHER): Payer: Medicare Other | Admitting: Family Medicine

## 2014-10-29 ENCOUNTER — Encounter: Payer: Self-pay | Admitting: Family Medicine

## 2014-10-29 VITALS — BP 130/68 | HR 58 | Wt 238.0 lb

## 2014-10-29 DIAGNOSIS — B360 Pityriasis versicolor: Secondary | ICD-10-CM

## 2014-10-29 MED ORDER — FLUCONAZOLE 150 MG PO TABS: 300.0000 mg | ORAL_TABLET | Freq: Once | ORAL | Status: DC

## 2014-10-29 NOTE — Progress Notes (Signed)
CC: Calvin Wise is a 69 y.o. male is here for possible tinea versicolor on shoulder and neck   Subjective: HPI:  Rash localized on the right and left chest that has been present for the past 1 or 2 weeks. He tells me it's painless and does not itch. It bothers his wife more than it bothers him. He gets this about once or twice a year. No interventions as of yet. He denies any skin changes elsewhere. No fevers, chills, unintentional weight loss, swollen lymph nodes nor difficulty fighting infections.   Review Of Systems Outlined In HPI  Past Medical History  Diagnosis Date  . Left rotator cuff tear 04/17/2012  . Glaucoma 05/02/2012  . Irregular heartbeat (Declines Workup 09/2011 [facility] Soni Kegel) 05/02/2012  . Erectile dysfunction 05/02/2012  . Prostate cancer     prostate  . Diabetes mellitus without complication     Type II  . History of GI bleed 2013    Related to ASA use    Past Surgical History  Procedure Laterality Date  . Prostate biopsy    . Hernia repair    . Hand surgery Bilateral 20 YEARS AGO  . Shoulder arthroscopy Right 2007    x2  . Refractive surgery    . Back surgery  1997  . Radioactive seed implant N/A 09/19/2014    Procedure: RADIOACTIVE SEED IMPLANT;  Surgeon: Ardis Hughs, MD;  Location: [facility];  Service: Urology;  Laterality: N/A;   Family History  Problem Relation Age of Onset  . Alcoholism Father   . Cancer Cousin   . Cancer Cousin   . Cancer Cousin     History   Social History  . Marital Status: Married    Spouse Name: N/A  . Number of Children: N/A  . Years of Education: N/A   Occupational History  . Not on file.   Social History Main Topics  . Smoking status: Former Smoker -- 0.25 packs/day for 44 years    Types: Cigarettes    Quit date: 05/24/2007  . Smokeless tobacco: Never Used  . Alcohol Use: No  . Drug Use: No  . Sexual Activity: Yes   Other Topics Concern  . Not on file   Social History Narrative      Objective: BP 130/68 mmHg  Pulse 58  Wt 238 lb (107.956 kg)  Vital signs reviewed. General: Alert and Oriented, No Acute Distress HEENT: Pupils equal, round, reactive to light. Conjunctivae clear.  External ears unremarkable.  Moist mucous membranes. Lungs: Clear and comfortable work of breathing, speaking in full sentences without accessory muscle use. Cardiac: Regular rate and rhythm.  Neuro: CN II-XII grossly intact, gait normal. Extremities: No peripheral edema.  Strong peripheral pulses.  Mental Status: No depression, anxiety, nor agitation. Logical though process. Skin: Warm and dry. Circular raised patches ranging from 1 cm-1.5 cm slightly erythematous and hyperpigmented on the right and left chest mild in severity.  Assessment & Plan: Calvin Wise was seen today for possible tinea versicolor on shoulder and neck.  Diagnoses and all orders for this visit:  Tinea versicolor Orders: -     fluconazole (DIFLUCAN) 150 MG tablet; Take 2 tablets (300 mg total) by mouth once. Repeat weekly until rash has improved.   Tinea versicolor: This has successfully been treated with tocolysis on the past. Repeat weekly regimen that has been successful. If no better in 2 weeks I've asked him to let me know.  He is due for an  A1c however are point-of-care A1c machine is broken today I've asked him to return in 2-4 weeks to have this checked.  Return if symptoms worsen or fail to improve.

## 2014-12-03 DIAGNOSIS — C61 Malignant neoplasm of prostate: Secondary | ICD-10-CM | POA: Diagnosis not present

## 2014-12-16 DIAGNOSIS — C61 Malignant neoplasm of prostate: Secondary | ICD-10-CM | POA: Diagnosis not present

## 2014-12-17 ENCOUNTER — Encounter: Payer: Self-pay | Admitting: Family Medicine

## 2014-12-17 ENCOUNTER — Ambulatory Visit (INDEPENDENT_AMBULATORY_CARE_PROVIDER_SITE_OTHER): Payer: Medicare Other | Admitting: Family Medicine

## 2014-12-17 VITALS — BP 148/72 | HR 48 | Wt 238.0 lb

## 2014-12-17 DIAGNOSIS — E119 Type 2 diabetes mellitus without complications: Secondary | ICD-10-CM

## 2014-12-17 DIAGNOSIS — I10 Essential (primary) hypertension: Secondary | ICD-10-CM

## 2014-12-17 DIAGNOSIS — Z1211 Encounter for screening for malignant neoplasm of colon: Secondary | ICD-10-CM | POA: Diagnosis not present

## 2014-12-17 LAB — POCT GLYCOSYLATED HEMOGLOBIN (HGB A1C): Hemoglobin A1C: 6.8

## 2014-12-17 NOTE — Progress Notes (Signed)
CC: Calvin Wise is a 69 y.o. male is here for Hyperglycemia   Subjective: HPI:   Follow-up type 2 diabetes: Currently taking metformin 1 g daily. No outside blood sugars to report. No vision loss, polyuria plication or polydipsia. Tries to stay active but no formal exercise routine  Follow-up essential hypertension: Currently not taking any antihypertensives medications. Tries to limit the amount of sodium in his diet. 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 He tells me this is extremely stressing him out right now it began yesterday. No chest pain shortness of breath orthopnea nor peripheral edema  He tells me he is ready to have a colonoscopy.   Review Of Systems Outlined In HPI  Past Medical History  Diagnosis Date  . Left rotator cuff tear 04/17/2012  . Glaucoma 05/02/2012  . Irregular heartbeat (Declines Workup 09/2011 [facility] Mindy Behnken) 05/02/2012  . Erectile dysfunction 05/02/2012  . Prostate cancer     prostate  . Diabetes mellitus without complication     Type II  . History of GI bleed 2013    Related to ASA use    Past Surgical History  Procedure Laterality Date  . Prostate biopsy    . Hernia repair    . Hand surgery Bilateral 20 YEARS AGO  . Shoulder arthroscopy Right 2007    x2  . Refractive surgery    . Back surgery  1997  . Radioactive seed implant N/A 09/19/2014    Procedure: RADIOACTIVE SEED IMPLANT;  Surgeon: Ardis Hughs, MD;  Location: [facility];  Service: Urology;  Laterality: N/A;   Family History  Problem Relation Age of Onset  . Alcoholism Father   . Cancer Cousin   . Cancer Cousin   . Cancer Cousin     History   Social History  . Marital Status: Married    Spouse Name: N/A  . Number of Children: N/A  . Years of Education: N/A   Occupational History  . Not on file.    Social History Main Topics  . Smoking status: Former Smoker -- 0.25 packs/day for 44 years    Types: Cigarettes    Quit date: 05/24/2007  . Smokeless tobacco: Never Used  . Alcohol Use: No  . Drug Use: No  . Sexual Activity: Yes   Other Topics Concern  . Not on file   Social History Narrative     Objective: BP 148/72 mmHg  Pulse 48  Wt 238 lb (107.956 kg)  General: Alert and Oriented, No Acute Distress HEENT: Pupils equal, round, reactive to light. Conjunctivae clear.  Moist because membranes Lungs: Clear to auscultation bilaterally, no wheezing/ronchi/rales.  Comfortable work of breathing. Good air movement. Cardiac: Regular rate and rhythm. Normal S1/S2.  No murmurs, rubs, nor gallops.   Extremities: No peripheral edema.  Strong peripheral pulses.  Mental Status: No depression, anxiety. Very polite as always but quite irritated today at possibly having to fire some coworkers Skin: Warm and dry.  Assessment & Plan: Calvin Wise was seen today for hyperglycemia.  Diagnoses and all orders for this visit:  Essential hypertension  Type 2 diabetes mellitus without complication Orders: -     POCT HgB A1C  Special screening for malignant neoplasms, colon Orders: -     Ambulatory referral to Gastroenterology   Essential hypertension: Controlled  chronic condition discussed diet and exercise interventions to help reduce blood pressure. I've asked him to check his blood pressure after the stress from his job has passed on after this weekend and if above 140/90 return for medication discussion. Type 2 diabetes: A1c 6.8, control continue daily metformin  Return if symptoms worsen or fail to improve.

## 2014-12-27 NOTE — Progress Notes (Signed)
  Radiation Oncology         [phone] ________________________________  Name: Calvin Wise MRN: [id number]  Date: 10/28/2014  DOB: 11-05-1945  3D Planning Note   Prostate Brachytherapy Post-Implant Dosimetry  Diagnosis: 69 y.o. gentleman with stage T1c adenocarcinoma of the prostate with a Gleason's score of 3+4 and a PSA of 5.31  Narrative: On a previous date, Calvin Wise returned following prostate seed implantation for post implant planning. He underwent CT scan complex simulation to delineate the three-dimensional structures of the pelvis and demonstrate the radiation distribution.  Since that time, the seed localization, and complex isodose planning with dose volume histograms have now been completed.  Results:   Prostate Coverage - The dose of radiation delivered to the 90% or more of the prostate gland (D90) was 122.68% of the prescription dose. This exceeds our goal of greater than 90%. Rectal Sparing - The volume of rectal tissue receiving the prescription dose or higher was 0.02 cc. This falls under our thresholds tolerance of 1.0 cc.  Impression: The prostate seed implant appears to show adequate target coverage and appropriate rectal sparing.  Plan:  The patient will continue to follow with urology for ongoing PSA determinations. I would anticipate a high likelihood for local tumor control with minimal risk for rectal morbidity.  ________________________________  Sheral Apley Tammi Klippel, M.D.

## 2015-01-06 NOTE — Addendum Note (Signed)
Encounter addended by: Heywood Footman, RN on: 01/06/2015  2:57 PM<BR>     Documentation filed: Inpatient Document Flowsheet

## 2015-02-11 ENCOUNTER — Telehealth: Payer: Self-pay | Admitting: *Deleted

## 2015-02-11 NOTE — Telephone Encounter (Signed)
Pt called and states he has not ever heard anything about scheduling a colonoscopy.

## 2015-03-18 DIAGNOSIS — Z86010 Personal history of colonic polyps: Secondary | ICD-10-CM | POA: Diagnosis not present

## 2015-03-18 DIAGNOSIS — R001 Bradycardia, unspecified: Secondary | ICD-10-CM | POA: Diagnosis not present

## 2015-03-18 DIAGNOSIS — Z01818 Encounter for other preprocedural examination: Secondary | ICD-10-CM | POA: Diagnosis not present

## 2015-03-18 DIAGNOSIS — Z87891 Personal history of nicotine dependence: Secondary | ICD-10-CM | POA: Diagnosis not present

## 2015-03-18 DIAGNOSIS — I10 Essential (primary) hypertension: Secondary | ICD-10-CM | POA: Diagnosis not present

## 2015-03-18 DIAGNOSIS — E119 Type 2 diabetes mellitus without complications: Secondary | ICD-10-CM | POA: Diagnosis not present

## 2015-03-23 DIAGNOSIS — I10 Essential (primary) hypertension: Secondary | ICD-10-CM | POA: Diagnosis not present

## 2015-03-23 DIAGNOSIS — D123 Benign neoplasm of transverse colon: Secondary | ICD-10-CM | POA: Diagnosis not present

## 2015-03-23 DIAGNOSIS — Z86010 Personal history of colonic polyps: Secondary | ICD-10-CM | POA: Diagnosis not present

## 2015-03-23 DIAGNOSIS — Z87891 Personal history of nicotine dependence: Secondary | ICD-10-CM | POA: Diagnosis not present

## 2015-03-23 DIAGNOSIS — N529 Male erectile dysfunction, unspecified: Secondary | ICD-10-CM | POA: Diagnosis not present

## 2015-03-23 DIAGNOSIS — E119 Type 2 diabetes mellitus without complications: Secondary | ICD-10-CM | POA: Diagnosis not present

## 2015-03-23 DIAGNOSIS — Z09 Encounter for follow-up examination after completed treatment for conditions other than malignant neoplasm: Secondary | ICD-10-CM | POA: Diagnosis not present

## 2015-04-15 ENCOUNTER — Encounter: Payer: Self-pay | Admitting: Family Medicine

## 2015-04-15 ENCOUNTER — Ambulatory Visit (INDEPENDENT_AMBULATORY_CARE_PROVIDER_SITE_OTHER): Payer: Medicare Other | Admitting: Family Medicine

## 2015-04-15 VITALS — BP 130/70 | HR 45 | Wt 241.0 lb

## 2015-04-15 DIAGNOSIS — N529 Male erectile dysfunction, unspecified: Secondary | ICD-10-CM | POA: Diagnosis not present

## 2015-04-15 DIAGNOSIS — E119 Type 2 diabetes mellitus without complications: Secondary | ICD-10-CM

## 2015-04-15 DIAGNOSIS — C61 Malignant neoplasm of prostate: Secondary | ICD-10-CM | POA: Diagnosis not present

## 2015-04-15 DIAGNOSIS — Z23 Encounter for immunization: Secondary | ICD-10-CM | POA: Diagnosis not present

## 2015-04-15 DIAGNOSIS — I10 Essential (primary) hypertension: Secondary | ICD-10-CM

## 2015-04-15 LAB — POCT GLYCOSYLATED HEMOGLOBIN (HGB A1C): Hemoglobin A1C: 6.7

## 2015-04-15 MED ORDER — METFORMIN HCL 1000 MG PO TABS: ORAL_TABLET | ORAL | Status: DC

## 2015-04-15 MED ORDER — SILDENAFIL CITRATE 20 MG PO TABS: 20.0000 mg | ORAL_TABLET | Freq: Every day | ORAL | Status: DC | PRN

## 2015-04-15 NOTE — Progress Notes (Signed)
CC: Calvin Wise is a 69 y.o. male is here for Hypertension   Subjective: HPI:  Follow-up essential hypertension: Currently taking no medication for blood pressure. Denies chest pain shortness of breath orthopnea nor peripheral edema. He tries to stay active as much as possible and watches his sodium intake.  Follow-up erectile dysfunction: He still gets benefit from taking Viagra 2-3 tablets for every sexual encounter. He denies any genitourinary complaints. No difficulty initiating or maintaining erection provided he takes the above regimen. Denies known side effects from the medication  Follow-up type 2 diabetes: No outside blood sugars to report other than a fasting blood sugar of 102 when he had a colonoscopy in August. No polyuria plication or polydipsia. Currently taking metformin daily without any side effects   Review Of Systems Outlined In HPI  Past Medical History  Diagnosis Date  . Left rotator cuff tear 04/17/2012  . Glaucoma 05/02/2012  . Irregular heartbeat (Declines Workup 09/2011 [facility] Hommel) 05/02/2012  . Erectile dysfunction 05/02/2012  . Prostate cancer     prostate  . Diabetes mellitus without complication     Type II  . History of GI bleed 2013    Related to ASA use    Past Surgical History  Procedure Laterality Date  . Prostate biopsy    . Hernia repair    . Hand surgery Bilateral 20 YEARS AGO  . Shoulder arthroscopy Right 2007    x2  . Refractive surgery    . Back surgery  1997  . Radioactive seed implant N/A 09/19/2014    Procedure: RADIOACTIVE SEED IMPLANT;  Surgeon: Ardis Hughs, MD;  Location: [facility];  Service: Urology;  Laterality: N/A;   Family History  Problem Relation Age of Onset  . Alcoholism Father   . Cancer Cousin   . Cancer Cousin   . Cancer Cousin     Social History   Social History  . Marital Status: Married    Spouse Name: N/A  . Number of Children: N/A  . Years of Education: N/A   Occupational  History  . Not on file.   Social History Main Topics  . Smoking status: Former Smoker -- 0.25 packs/day for 44 years    Types: Cigarettes    Quit date: 05/24/2007  . Smokeless tobacco: Never Used  . Alcohol Use: No  . Drug Use: No  . Sexual Activity: Yes   Other Topics Concern  . Not on file   Social History Narrative     Objective: BP 130/70 mmHg  Pulse 45  Wt 241 lb (109.317 kg)  General: Alert and Oriented, No Acute Distress HEENT: Pupils equal, round, reactive to light. Conjunctivae clear.  Moist mucous membranes Lungs: Clear to auscultation bilaterally, no wheezing/ronchi/rales.  Comfortable work of breathing. Good air movement. Cardiac: Regular rate and rhythm. Normal S1/S2.  No murmurs, rubs, nor gallops.   Extremities: No peripheral edema.  Strong peripheral pulses.  Mental Status: No depression, anxiety, nor agitation. Skin: Warm and dry.  Assessment & Plan: Calvin Wise was seen today for hypertension.  Diagnoses and all orders for this visit:  Encounter for immunization  Essential hypertension  Erectile dysfunction, unspecified erectile dysfunction type -     sildenafil (REVATIO) 20 MG tablet; Take 1-3 tablets (20-60 mg total) by mouth daily as needed (sex).  Type 2 diabetes mellitus without complication -     POCT HgB A1C  Other orders -     Flu Vaccine QUAD 36+ mos IM  Essential hypertension: Initial blood pressure was in the stage I hypertension range, checking a manual pressure after resting was in the prehypertensive state, continuing with diet and exercise interventions. Erectile dysfunction: Controlled with as the use of generic Viagra Type 2 diabetes: A1c of 6.7, controlled, continue current metformin regimen  Return in about 3 months (around 07/15/2015) for BP and Sugar.

## 2015-04-21 DIAGNOSIS — C61 Malignant neoplasm of prostate: Secondary | ICD-10-CM | POA: Diagnosis not present

## 2015-06-04 ENCOUNTER — Encounter: Payer: Self-pay | Admitting: Family Medicine

## 2015-07-15 ENCOUNTER — Ambulatory Visit: Payer: Medicare Other | Admitting: Family Medicine

## 2015-09-01 ENCOUNTER — Ambulatory Visit (INDEPENDENT_AMBULATORY_CARE_PROVIDER_SITE_OTHER): Payer: Medicare Other | Admitting: Family Medicine

## 2015-09-01 ENCOUNTER — Encounter: Payer: Self-pay | Admitting: Family Medicine

## 2015-09-01 VITALS — BP 130/65 | HR 55 | Wt 239.0 lb

## 2015-09-01 DIAGNOSIS — Z23 Encounter for immunization: Secondary | ICD-10-CM | POA: Diagnosis not present

## 2015-09-01 DIAGNOSIS — I10 Essential (primary) hypertension: Secondary | ICD-10-CM

## 2015-09-01 DIAGNOSIS — E119 Type 2 diabetes mellitus without complications: Secondary | ICD-10-CM

## 2015-09-01 LAB — POCT GLYCOSYLATED HEMOGLOBIN (HGB A1C): Hemoglobin A1C: 7.2

## 2015-09-01 MED ORDER — PNEUMOCOCCAL 13-VAL CONJ VACC IM SUSP
0.5000 mL | Freq: Once | INTRAMUSCULAR | Status: AC
  Administered 2015-09-01: 0.5 mL via INTRAMUSCULAR

## 2015-09-01 MED ORDER — METFORMIN HCL 1000 MG PO TABS: ORAL_TABLET | ORAL | Status: DC

## 2015-09-01 NOTE — Addendum Note (Signed)
Addended by: Delrae Alfred on: 09/01/2015 04:08 PM   Modules accepted: Orders

## 2015-09-01 NOTE — Addendum Note (Signed)
Addended by: Delrae Alfred on: 09/01/2015 04:32 PM   Modules accepted: Orders

## 2015-09-01 NOTE — Progress Notes (Signed)
CC: Calvin Wise is a 70 y.o. male is here for Hyperglycemia and Immunizations   Subjective: HPI:   follow-up essential hypertension: currently watching his sodium intake. No formal exercise routine but likes to stay active. Denies chest pain shortness of breath orthopnea nor peripheral edema.   follow-up type 2 diabetes: a few months ago his [place] doctor told him to cut his metformin to only 500 mg daily because he was experiencing symptoms of hypoglycemia when taking 1000 mg every morning. No outside blood  Sugars to report.  Denies vision loss, polyuria by visual polydipsia.  Review Of Systems Outlined In HPI  Past Medical History  Diagnosis Date  . Left rotator cuff tear 04/17/2012  . Glaucoma 05/02/2012  . Irregular heartbeat (Declines Workup 09/2011 WF Kohen Reither) 05/02/2012  . Erectile dysfunction 05/02/2012  . Prostate cancer [facility])     prostate  . Diabetes mellitus without complication ([place])     Type II  . History of GI bleed 2013    Related to ASA use    Past Surgical History  Procedure Laterality Date  . Prostate biopsy    . Hernia repair    . Hand surgery Bilateral 20 YEARS AGO  . Shoulder arthroscopy Right 2007    x2  . Refractive surgery    . Back surgery  1997  . Radioactive seed implant N/A 09/19/2014    Procedure: RADIOACTIVE SEED IMPLANT;  Surgeon: Ardis Hughs, MD;  Location: [facility];  Service: Urology;  Laterality: N/A;   Family History  Problem Relation Age of Onset  . Alcoholism Father   . Cancer Cousin   . Cancer Cousin   . Cancer Cousin     Social History   Social History  . Marital Status: Married    Spouse Name: N/A  . Number of Children: N/A  . Years of Education: N/A   Occupational History  . Not on file.   Social History Main Topics  . Smoking status: Former Smoker -- 0.25 packs/day for 44 years    Types: Cigarettes    Quit date: 05/24/2007  . Smokeless tobacco: Never Used  . Alcohol Use: No  . Drug Use: No  .  Sexual Activity: Yes   Other Topics Concern  . Not on file   Social History Narrative     Objective: BP 130/65 mmHg  Pulse 55  Wt 239 lb (108.41 kg)  General: Alert and Oriented, No Acute Distress HEENT: Pupils equal, round, reactive to light. Conjunctivae clear.   Moist mucous membranes Lungs: Clear to auscultation bilaterally, no wheezing/ronchi/rales.  Comfortable work of breathing. Good air movement. Cardiac: Regular rate and rhythm. Normal S1/S2.  No murmurs, rubs, nor gallops.   Extremities: No peripheral edema.  Strong peripheral pulses.  Mental Status: No depression, anxiety, nor agitation. Skin: Warm and dry.  Assessment & Plan: Calvin Wise was seen today for hyperglycemia and immunizations.  Diagnoses and all orders for this visit:  Essential hypertension  Type 2 diabetes mellitus without complication, without long-term current use of insulin (HCC) -     metFORMIN (GLUCOPHAGE) 1000 MG tablet; Half tab every morning and half tab every evening for blood sugar control.    essential hypertension: controlled with diet and exercise interventions, no need to start antihypertensives.  Type 2 diabetes: Uncontrolled chronic condition: encouraged to start  Taking metformin 500 mg in the morning and 500 mg in the evening if tolerated with respect to hypoglycemia. Recheck in 3 months.  Return in  about 3 months (around 11/29/2015) for Sugar.

## 2015-10-20 DIAGNOSIS — C61 Malignant neoplasm of prostate: Secondary | ICD-10-CM | POA: Diagnosis not present

## 2015-10-27 DIAGNOSIS — Z8546 Personal history of malignant neoplasm of prostate: Secondary | ICD-10-CM | POA: Diagnosis not present

## 2015-10-27 DIAGNOSIS — Z0000 Encounter for general adult medical examination without abnormal findings: Secondary | ICD-10-CM | POA: Diagnosis not present

## 2015-10-28 ENCOUNTER — Encounter: Payer: Self-pay | Admitting: Family Medicine

## 2015-10-28 LAB — PSA
PSA, FREE: 1.12
PSA, FREE: 2.71
PSA, FREE: 4.54
PSA, FREE: 5.31
Prostate Specific Ag, Serum: 1.85

## 2015-11-09 ENCOUNTER — Telehealth: Payer: Self-pay | Admitting: Family Medicine

## 2015-11-09 DIAGNOSIS — E119 Type 2 diabetes mellitus without complications: Secondary | ICD-10-CM

## 2015-11-09 MED ORDER — METFORMIN HCL 1000 MG PO TABS
ORAL_TABLET | ORAL | Status: DC
Start: 2015-11-09 — End: 2016-03-09

## 2015-11-09 NOTE — Telephone Encounter (Signed)
Refill req 

## 2015-12-02 ENCOUNTER — Ambulatory Visit: Payer: Medicare Other | Admitting: Family Medicine

## 2016-03-09 ENCOUNTER — Encounter: Payer: Self-pay | Admitting: Family Medicine

## 2016-03-09 ENCOUNTER — Ambulatory Visit (INDEPENDENT_AMBULATORY_CARE_PROVIDER_SITE_OTHER): Payer: Medicare Other | Admitting: Family Medicine

## 2016-03-09 VITALS — BP 141/71 | HR 49 | Wt 244.0 lb

## 2016-03-09 DIAGNOSIS — E119 Type 2 diabetes mellitus without complications: Secondary | ICD-10-CM

## 2016-03-09 DIAGNOSIS — Z23 Encounter for immunization: Secondary | ICD-10-CM

## 2016-03-09 DIAGNOSIS — N529 Male erectile dysfunction, unspecified: Secondary | ICD-10-CM | POA: Diagnosis not present

## 2016-03-09 DIAGNOSIS — R0981 Nasal congestion: Secondary | ICD-10-CM | POA: Diagnosis not present

## 2016-03-09 LAB — POCT GLYCOSYLATED HEMOGLOBIN (HGB A1C): Hemoglobin A1C: 7.4

## 2016-03-09 MED ORDER — SILDENAFIL CITRATE 20 MG PO TABS: 20.0000 mg | ORAL_TABLET | Freq: Every day | ORAL | 2 refills | Status: AC | PRN

## 2016-03-09 MED ORDER — MONTELUKAST SODIUM 10 MG PO TABS: 10.0000 mg | ORAL_TABLET | Freq: Every day | ORAL | 3 refills | Status: AC

## 2016-03-09 MED ORDER — METFORMIN HCL 1000 MG PO TABS: ORAL_TABLET | ORAL | 4 refills | Status: DC

## 2016-03-09 NOTE — Progress Notes (Signed)
CC: Calvin Wise is a 70 y.o. male is here for Hyperglycemia   Subjective: HPI:  Follow-up type 2 diabetes: Currently taking metformin 1000 mg at night before going to bed. He denies any hyperglycemic episodes. No outside blood sugars to report. He denies any known side effects. He denies diarrhea. He denies vision disturbance or poorly healing wounds.  He would like a refill on generic Viagra. He still thinks it is helping and denies any known side effects. He has no genitourinary complaints today other than erectile dysfunction when not using this medicine  He would like to know if there is a medication to help the sinuses that does not raise blood pressure or come in a nasal solution.   Review Of Systems Outlined In HPI  Past Medical History:  Diagnosis Date  . Diabetes mellitus without complication ([place])    Type II  . Erectile dysfunction 05/02/2012  . Glaucoma 05/02/2012  . History of GI bleed 2013   Related to ASA use  . Irregular heartbeat (Declines Workup 09/2011 [facility] Audia Amick) 05/02/2012  . Left rotator cuff tear 04/17/2012  . Prostate cancer [facility])    prostate    Past Surgical History:  Procedure Laterality Date  . BACK SURGERY  1997  . HAND SURGERY Bilateral 20 YEARS AGO  . HERNIA REPAIR    . PROSTATE BIOPSY    . RADIOACTIVE SEED IMPLANT N/A 09/19/2014   Procedure: RADIOACTIVE SEED IMPLANT;  Surgeon: Ardis Hughs, MD;  [facility];  Service: Urology;  Laterality: N/A;  . REFRACTIVE SURGERY    . SHOULDER ARTHROSCOPY Right 2007   x2   Family History  Problem Relation Age of Onset  . Alcoholism Father   . Cancer Cousin   . Cancer Cousin   . Cancer Cousin     Social History   Social History  . Marital status: Married    Spouse name: N/A  . Number of children: N/A  . Years of education: N/A   Occupational History  . Not on file.   Social History Main Topics  . Smoking status: Former Smoker    Packs/day: 0.25    Years: 44.00    Types: Cigarettes    Quit date: 05/24/2007  . Smokeless tobacco: Never Used  . Alcohol use No  . Drug use: No  . Sexual activity: Yes   Other Topics Concern  . Not on file   Social History Narrative  . No narrative on file     Objective: BP (!) 141/71   Pulse (!) 49   Wt 244 lb (110.7 kg)   BMI 30.50 kg/m   Vital signs reviewed. General: Alert and Oriented, No Acute Distress HEENT: Pupils equal, round, reactive to light. Conjunctivae clear.  External ears unremarkable.  Moist mucous membranes. Lungs: Clear and comfortable work of breathing, speaking in full sentences without accessory muscle use. Cardiac: Regular rate and rhythm.  Neuro: CN II-XII grossly intact, gait normal. Extremities: No peripheral edema.  Strong peripheral pulses.  Mental Status: No depression, anxiety, nor agitation. Logical though process. Skin: Warm and dry.  Assessment & Plan: [place] was seen today for hyperglycemia.  Diagnoses and all orders for this visit:  Needs flu shot -     Flu Vaccine QUAD 36+ mos IM  Type 2 diabetes mellitus without complication, without long-term current use of insulin (HCC) -     POCT HgB A1C -     metFORMIN (GLUCOPHAGE) 1000 MG tablet; Tab every evening  for blood sugar control.  Erectile dysfunction, unspecified erectile dysfunction type -     sildenafil (REVATIO) 20 MG tablet; Take 1-3 tablets (20-60 mg total) by mouth daily as needed (sex).  Sinus congestion  Other orders -     montelukast (SINGULAIR) 10 MG tablet; Take 1 tablet (10 mg total) by mouth at bedtime. For sinus pain.  Type 2 diabetes: Mildly uncontrolled with an A1c of 7.4, and he is optimistic that he cut out carbohydrates in his diet rather than my proposal of increasing metformin Erectile dysfunction: Controlled with generic Viagra Sinus congestion: Stop flonase with trial of Singulair with Zyrtec  Discussed with this patient that I will be resigning from my position here with [place] in  September in order to stay with my family who will be moving to [facility]. I let him know about the providers that are still accepting patients and I feel that this individual will be under great care if he/she stays here with [facility].  Return in about 3 months (around 06/09/2016).

## 2016-04-27 DIAGNOSIS — C61 Malignant neoplasm of prostate: Secondary | ICD-10-CM | POA: Diagnosis not present

## 2016-05-26 IMAGING — CR DG CHEST 2V
2 series · 2 of 2 positions shown · non-contrast
Comparison: None.

CLINICAL DATA: Preop for prostate seed implantation, history of
prostate carcinoma, injured chest 4 days ago

EXAM:
CHEST  2 VIEW

[w chest pa]
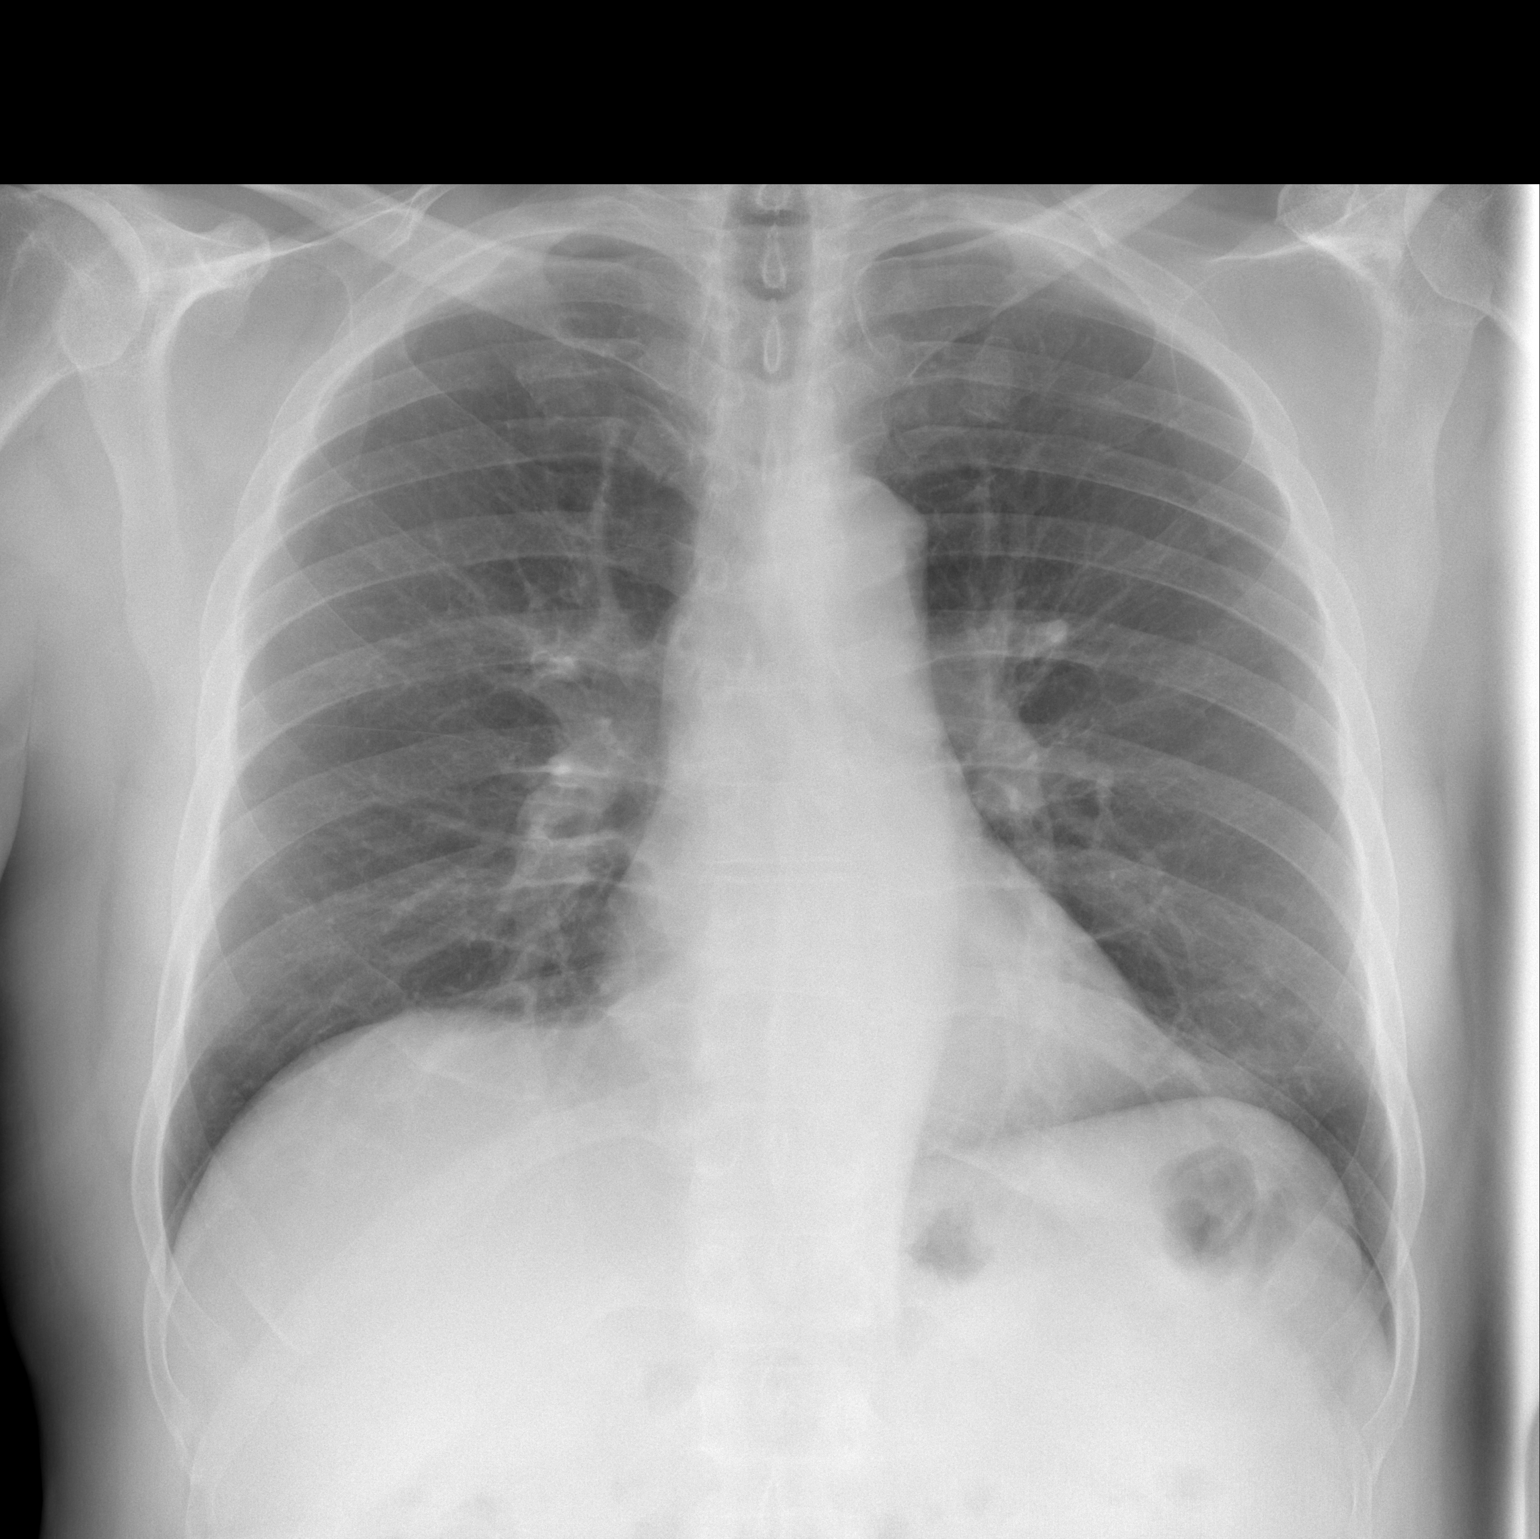

[w chest lat]
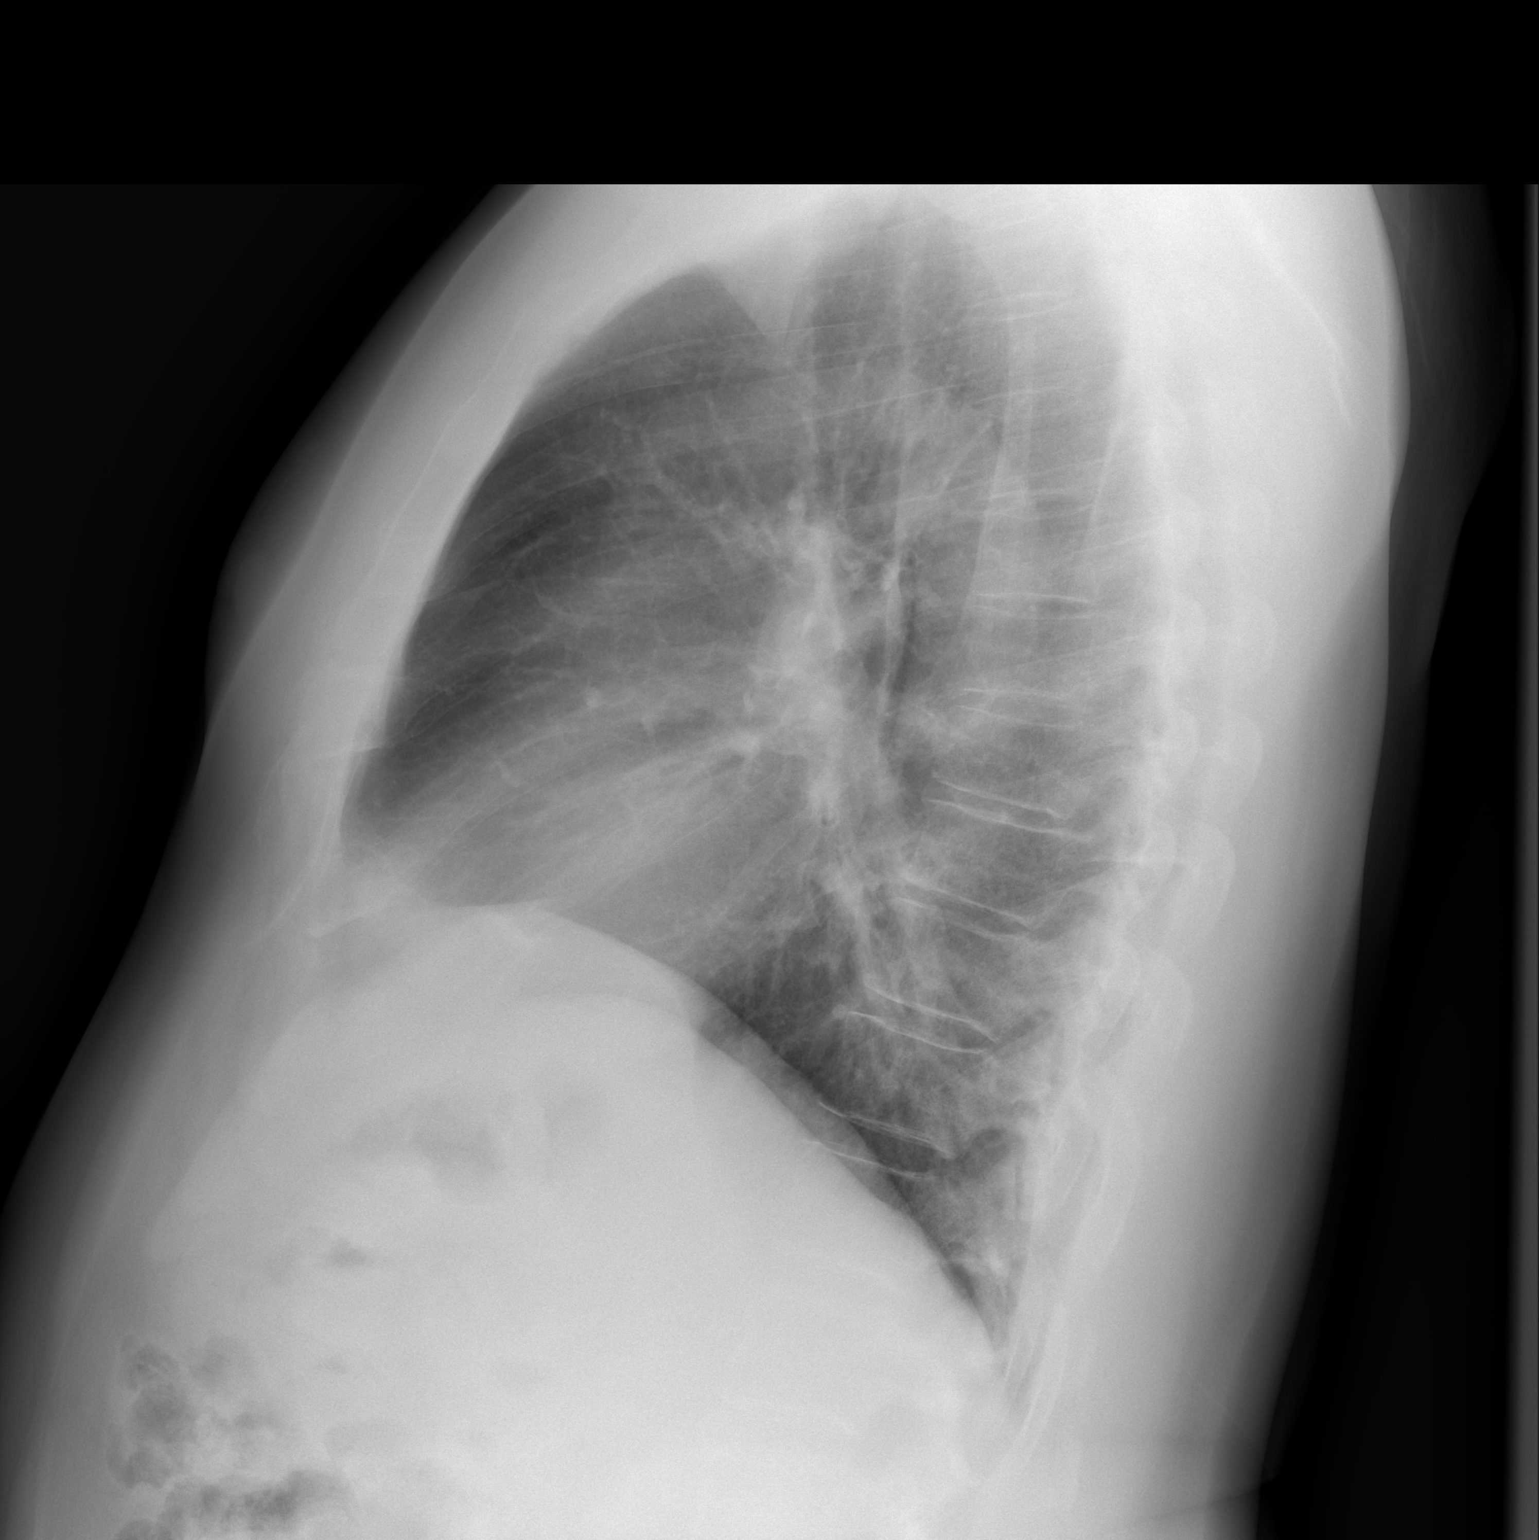

[2 of 2 positions shown; findings below may reference images not displayed]

FINDINGS: No active infiltrate or effusion is seen. Mediastinal and hilar
contours are unremarkable. The heart is within normal limits in
size. No bony abnormality is seen.
IMPRESSION: No active cardiopulmonary disease.

## 2016-05-27 ENCOUNTER — Ambulatory Visit (INDEPENDENT_AMBULATORY_CARE_PROVIDER_SITE_OTHER): Payer: Medicare Other | Admitting: Osteopathic Medicine

## 2016-05-27 ENCOUNTER — Encounter: Payer: Self-pay | Admitting: Osteopathic Medicine

## 2016-05-27 VITALS — BP 148/72 | HR 84 | Ht 75.0 in | Wt 231.0 lb

## 2016-05-27 DIAGNOSIS — S91102A Unspecified open wound of left great toe without damage to nail, initial encounter: Secondary | ICD-10-CM | POA: Diagnosis not present

## 2016-05-27 MED ORDER — DOXYCYCLINE MONOHYDRATE 100 MG PO CAPS: 100.0000 mg | ORAL_CAPSULE | Freq: Two times a day (BID) | ORAL | 0 refills | Status: DC

## 2016-05-27 NOTE — Progress Notes (Signed)
HPI: Calvin Wise is a 70 y.o. male  who presents to [place] today, 05/27/16,  for chief complaint of:  Chief Complaint  Patient presents with  . Foot Injury     He injured/stubbed 1st toe L foot about 2 weeks ago, [facility] (brings bottle of augmentin), last night was swollen again so he was able to drain some of it and now needs it checked out. Nonpainful at this time. No drainage or pain now. No fever,no joint swelling. He states whole foot was swollen but now looks  A lot better.    Past medical, surgical, social and family history reviewed: Past Medical History:  Diagnosis Date  . Diabetes mellitus without complication ([place])    Type II  . Erectile dysfunction 05/02/2012  . Glaucoma 05/02/2012  . History of GI bleed 2013   Related to ASA use  . Irregular heartbeat (Declines Workup 09/2011 [facility]) 05/02/2012  . Left rotator cuff tear 04/17/2012  . Prostate cancer [facility])    prostate   Past Surgical History:  Procedure Laterality Date  . BACK SURGERY  1997  . HAND SURGERY Bilateral 20 YEARS AGO  . HERNIA REPAIR    . PROSTATE BIOPSY    . RADIOACTIVE SEED IMPLANT N/A 09/19/2014   Procedure: RADIOACTIVE SEED IMPLANT;  Surgeon: Ardis Hughs, MD;  Location: [facility];  Service: Urology;  Laterality: N/A;  . REFRACTIVE SURGERY    . SHOULDER ARTHROSCOPY Right 2007   x2   Social History  Substance Use Topics  . Smoking status: Former Smoker    Packs/day: 0.25    Years: 44.00    Types: Cigarettes    Quit date: 05/24/2007  . Smokeless tobacco: Never Used  . Alcohol use No   Family History  Problem Relation Age of Onset  . Alcoholism Father   . Cancer Cousin   . Cancer Cousin   . Cancer Cousin      Current medication list and allergy/intolerance information reviewed:   Current Outpatient Prescriptions on File Prior to Visit  Medication Sig Dispense Refill  . amitriptyline  (ELAVIL) 25 MG tablet Take 1 tablet (25 mg total) by mouth at bedtime. 30 tablet 0  . brimonidine (ALPHAGAN P) 0.1 % SOLN 1 drop.    . cetirizine (ZYRTEC) 10 MG tablet Take 10 mg by mouth daily.    . dorzolamide-timolol (COSOPT) 22.3-6.8 MG/ML ophthalmic solution 1 drop.    . metFORMIN (GLUCOPHAGE) 1000 MG tablet Tab every evening for blood sugar control. 90 tablet 4  . montelukast (SINGULAIR) 10 MG tablet Take 1 tablet (10 mg total) by mouth at bedtime. For sinus pain. 30 tablet 3  . sildenafil (REVATIO) 20 MG tablet Take 1-3 tablets (20-60 mg total) by mouth daily as needed (sex). 50 tablet 2  . Travoprost (TRAVATAN OP) Apply to eye.     No current facility-administered medications on file prior to visit.    Allergies  Allergen Reactions  . Aspirin     Gi bleeding  . Clindamycin/Lincomycin     rash      Review of Systems:  Constitutional: No recent illness  Cardiac: No  chest pain  Respiratory:  No  shortness of breath  Musculoskeletal: No new myalgia/arthralgia  Skin: No  Rash, + wound as per HPI  Neurologic: No  weakness,   Exam:  BP (!) 148/72   Pulse 84   Ht 6'  3" (1.905 m)   Wt 231 lb (104.8 kg)   BMI 28.87 kg/m   Constitutional: VS see above. General Appearance: alert, well-developed, well-nourished, NAD  Eyes: Normal lids and conjunctive, non-icteric sclera  Ears, Nose, Mouth, Throat: MMM, Normal external inspection ears/nares/mouth/lips/gums.  Neck: No masses, trachea midline.   Respiratory: Normal respiratory effort  Cardiovascular: no LE edema   Musculoskeletal: Gait normal. Symmetric and independent movement of all extremities. Normal active ROM L foot/toes.   Neurological: Normal balance/coordination. No tremor.  Skin: warm, dry, intact except for abrasion on L great toe, granulation tissue with dry dead skin surrounding wound, no foul odor or drainage, No nail involvement, nail +onychomycosis, cut back away from wound  Psychiatric: Normal  judgment/insight. Normal mood and affect. Oriented x3.      ASSESSMENT/PLAN:   Open wound of great toe, left, initial encounter - Plan: doxycycline (MONODOX) 100 MG capsule   Procedure: wound debridement. Wound cleaned and debrided using forceps and scalpel, no anesthesia needed, no specimens sent, wound bandaged with antibiotic ointment, telfa pad and paper tape, patient tolerated procedure well.   Patient Instructions  If skin becomes red, tender, swollen or wound starts draining, start the antibiotics and let me know. Otherwise come back for me to recheck the wound in one week, or call us sooner if any problems! Keep bandaged as demonstrated in the office.     Visit summary with medication list and pertinent instructions was printed for patient to review. All questions at time of visit were answered - patient instructed to contact office with any additional concerns. ER/RTC precautions were reviewed with the patient. Follow-up plan: Return in about 1 week (around 06/03/2016) for WOUND RECHECK / ESTABLISH CARE.

## 2016-05-27 NOTE — Patient Instructions (Signed)
If skin becomes red, tender, swollen or wound starts draining, start the antibiotics and let me know. Otherwise come back for me to recheck the wound in one week, or call us sooner if any problems! Keep bandaged as demonstrated in the office.

## 2016-05-31 ENCOUNTER — Encounter: Payer: Self-pay | Admitting: Osteopathic Medicine

## 2016-05-31 ENCOUNTER — Ambulatory Visit (INDEPENDENT_AMBULATORY_CARE_PROVIDER_SITE_OTHER): Payer: Medicare Other | Admitting: Osteopathic Medicine

## 2016-05-31 VITALS — BP 134/73 | HR 58 | Ht 75.0 in | Wt 226.0 lb

## 2016-05-31 DIAGNOSIS — S91102A Unspecified open wound of left great toe without damage to nail, initial encounter: Secondary | ICD-10-CM | POA: Diagnosis not present

## 2016-05-31 DIAGNOSIS — E119 Type 2 diabetes mellitus without complications: Secondary | ICD-10-CM

## 2016-05-31 DIAGNOSIS — I10 Essential (primary) hypertension: Secondary | ICD-10-CM | POA: Diagnosis not present

## 2016-05-31 NOTE — Patient Instructions (Addendum)
Will request records from the [place] - if we are unable to get records, we may need to repeat labs.   Bring all pill bottles to next visit.   Any questions, please let me know!

## 2016-05-31 NOTE — Progress Notes (Signed)
HPI: Calvin Wise is a 70 y.o. male  who presents to [place] today, 05/31/16,  for chief complaint of:  Chief Complaint  Patient presents with  . Establish Care    WOUND CHECK    Woundcheck: Patient has been bandaging his wound appropriately, no drainage, no redness, no signs of infection. No pain.  Diabetes: Last A1c August 2017 was good. Compliant with metformin as noted below.  PSA: following with Urologist. Last PSA on file 08/2015 and was 1.12.   HTN: Borderline, patient compliant with medications as noted below. No chest pain, pressure, shortness of breath   Past medical, surgical, social and family history reviewed: Past Medical History:  Diagnosis Date  . Diabetes mellitus without complication ([place])    Type II  . Erectile dysfunction 05/02/2012  . Glaucoma 05/02/2012  . History of GI bleed 2013   Related to ASA use  . Irregular heartbeat (Declines Workup 09/2011 [facility]) 05/02/2012  . Left rotator cuff tear 04/17/2012  . Prostate cancer [facility])    prostate   Past Surgical History:  Procedure Laterality Date  . BACK SURGERY  1997  . HAND SURGERY Bilateral 20 YEARS AGO  . HERNIA REPAIR    . PROSTATE BIOPSY    . RADIOACTIVE SEED IMPLANT N/A 09/19/2014   Procedure: RADIOACTIVE SEED IMPLANT;  Surgeon: Ardis Hughs, MD;  Location: [facility];  Service: Urology;  Laterality: N/A;  . REFRACTIVE SURGERY    . SHOULDER ARTHROSCOPY Right 2007   x2   Social History  Substance Use Topics  . Smoking status: Former Smoker    Packs/day: 0.25    Years: 44.00    Types: Cigarettes    Quit date: 05/24/2007  . Smokeless tobacco: Never Used  . Alcohol use No   Family History  Problem Relation Age of Onset  . Alcoholism Father   . Cancer Cousin   . Cancer Cousin   . Cancer Cousin      Current medication list and allergy/intolerance information reviewed:   Current Outpatient Prescriptions on File Prior to Visit    Medication Sig Dispense Refill  . amitriptyline (ELAVIL) 25 MG tablet Take 1 tablet (25 mg total) by mouth at bedtime. 30 tablet 0  . brimonidine (ALPHAGAN P) 0.1 % SOLN 1 drop.    . cetirizine (ZYRTEC) 10 MG tablet Take 10 mg by mouth daily.    . dorzolamide-timolol (COSOPT) 22.3-6.8 MG/ML ophthalmic solution 1 drop.    Marland Kitchen doxycycline (MONODOX) 100 MG capsule Take 1 capsule (100 mg total) by mouth 2 (two) times daily. Fill if infection worsens 14 capsule 0  . metFORMIN (GLUCOPHAGE) 1000 MG tablet Tab every evening for blood sugar control. 90 tablet 4  . montelukast (SINGULAIR) 10 MG tablet Take 1 tablet (10 mg total) by mouth at bedtime. For sinus pain. 30 tablet 3  . sildenafil (REVATIO) 20 MG tablet Take 1-3 tablets (20-60 mg total) by mouth daily as needed (sex). 50 tablet 2  . Travoprost (TRAVATAN OP) Apply to eye.     No current facility-administered medications on file prior to visit.    Allergies  Allergen Reactions  . Aspirin     Gi bleeding  . Clindamycin/Lincomycin     rash      Review of Systems:  Constitutional: No recent illness  Cardiac: No  chest pain, No  pressure  Respiratory:  No  shortness of breath.  Musculoskeletal: No new myalgia/arthralgia  Skin: No  Rash, wound as per HPI  Neurologic: No  weakness, No  Dizziness   Exam:  BP (!) 140/96   Pulse (!) 55   Ht 6\' 3"  (1.905 m)   Wt 226 lb (102.5 kg)   BMI 28.25 kg/m   Constitutional: VS see above. General Appearance: alert, well-developed, well-nourished, NAD  Eyes: Normal lids and conjunctive, non-icteric sclera  Ears, Nose, Mouth, Throat: MMM, Normal external inspection ears/nares/mouth/lips/gums.  Neck: No masses, trachea midline.   Respiratory: Normal respiratory effort. no wheeze, no rhonchi, no rales  Cardiovascular: S1/S2 normal, no murmur, no rub/gallop auscultated. RRR.   Musculoskeletal: Gait normal.   Neurological: Normal balance/coordination. No tremor.  Skin: warm, dry.  Wound appears to be healing normally. Granulation tissue present, see photograph below. Some callus at front of the foot but I think this is probably more protective and is a problem.  Psychiatric: Normal judgment/insight. Normal mood and affect. Oriented x3.        ASSESSMENT/PLAN:   Open wound of great toe, left, initial encounter  Type 2 diabetes mellitus without complication, without long-term current use of insulin ([place])  Essential hypertension    Patient Instructions  Will request records from the [place] - if we are unable to get records, we may need to repeat labs.   Bring all pill bottles to next visit.   Any questions, please let me know!    Visit summary with medication list and pertinent instructions was printed for patient to review. All questions at time of visit were answered - patient instructed to contact office with any additional concerns. ER/RTC precautions were reviewed with the patient. Follow-up plan: Return in about 4 weeks (around 06/28/2016) for [place], sooner if needed.

## 2016-06-03 ENCOUNTER — Ambulatory Visit (INDEPENDENT_AMBULATORY_CARE_PROVIDER_SITE_OTHER): Payer: Medicare Other | Admitting: Osteopathic Medicine

## 2016-06-03 ENCOUNTER — Encounter: Payer: Self-pay | Admitting: Osteopathic Medicine

## 2016-06-03 VITALS — BP 150/78 | Temp 98.2°F | Ht 75.0 in | Wt 222.0 lb

## 2016-06-03 DIAGNOSIS — R899 Unspecified abnormal finding in specimens from other organs, systems and tissues: Secondary | ICD-10-CM

## 2016-06-03 DIAGNOSIS — E785 Hyperlipidemia, unspecified: Secondary | ICD-10-CM | POA: Diagnosis not present

## 2016-06-03 DIAGNOSIS — H1045 Other chronic allergic conjunctivitis: Secondary | ICD-10-CM

## 2016-06-03 DIAGNOSIS — E1165 Type 2 diabetes mellitus with hyperglycemia: Secondary | ICD-10-CM | POA: Diagnosis not present

## 2016-06-03 DIAGNOSIS — I951 Orthostatic hypotension: Secondary | ICD-10-CM

## 2016-06-03 DIAGNOSIS — Z886 Allergy status to analgesic agent status: Secondary | ICD-10-CM | POA: Diagnosis not present

## 2016-06-03 DIAGNOSIS — Z881 Allergy status to other antibiotic agents status: Secondary | ICD-10-CM | POA: Diagnosis not present

## 2016-06-03 DIAGNOSIS — H409 Unspecified glaucoma: Secondary | ICD-10-CM

## 2016-06-03 DIAGNOSIS — S91102A Unspecified open wound of left great toe without damage to nail, initial encounter: Secondary | ICD-10-CM | POA: Insufficient documentation

## 2016-06-03 DIAGNOSIS — R739 Hyperglycemia, unspecified: Secondary | ICD-10-CM | POA: Diagnosis not present

## 2016-06-03 DIAGNOSIS — H539 Unspecified visual disturbance: Secondary | ICD-10-CM | POA: Diagnosis not present

## 2016-06-03 DIAGNOSIS — Z79899 Other long term (current) drug therapy: Secondary | ICD-10-CM | POA: Diagnosis not present

## 2016-06-03 DIAGNOSIS — I10 Essential (primary) hypertension: Secondary | ICD-10-CM | POA: Diagnosis not present

## 2016-06-03 DIAGNOSIS — H538 Other visual disturbances: Secondary | ICD-10-CM | POA: Diagnosis not present

## 2016-06-03 LAB — POCT URINALYSIS DIPSTICK
Bilirubin, UA: NEGATIVE
Ketones, UA: NEGATIVE
Leukocytes, UA: NEGATIVE
Nitrite, UA: NEGATIVE
PROTEIN UA: NEGATIVE
RBC UA: NEGATIVE
UROBILINOGEN UA: 0.2
pH, UA: 5.5

## 2016-06-03 LAB — GLUCOSE, POCT (MANUAL RESULT ENTRY)

## 2016-06-03 LAB — POCT GLYCOSYLATED HEMOGLOBIN (HGB A1C): HEMOGLOBIN A1C: 11.5

## 2016-06-03 NOTE — Progress Notes (Signed)
HPI: Calvin Wise is a 70 y.o. male  who presents to [place] today, 06/03/16,  for chief complaint of:  Chief Complaint  Patient presents with  . DRY MOUTH     DRY MOUTH . Quality: dry mouth . Duration: noticing about 5 days or so  . Modifying factors: has been drinking more water, urinating more often  . Assoc signs/symptoms: vision changed a bit, went to eye Dr and they adjusted his prescription, checked his pressure and he states it was fine. Lightheaded.   DIABETES  Sharp increase in A1c. 2 months ago was 7.4, is now 11.5. Patient is taking metformin only.   Associated signs/symptoms: Polyuria/polydipsia, weight loss, dry mouth as above and vision change as above. Reports weakness and lightheaded/dizziness.     Past medical, surgical, social and family history reviewed: Patient Active Problem List   Diagnosis Date Noted  . Prostate cancer ([place]) 06/11/2014  . Colon polyp 10/30/2013  . TMJ syndrome 02/11/2013  . Type 2 diabetes mellitus ([place]) 07/04/2012  . Glaucoma 05/02/2012  . Irregular heartbeat (Declines Workup 09/2011 [facility]) 05/02/2012  . Erectile dysfunction 05/02/2012  . Essential hypertension 04/17/2012  . Left rotator cuff tear 04/17/2012  . Sciatica 04/17/2012   Past Surgical History:  Procedure Laterality Date  . BACK SURGERY  1997  . HAND SURGERY Bilateral 20 YEARS AGO  . HERNIA REPAIR    . PROSTATE BIOPSY    . RADIOACTIVE SEED IMPLANT N/A 09/19/2014   Procedure: RADIOACTIVE SEED IMPLANT;  Surgeon: Ardis Hughs, MD;  Location: [facility];  Service: Urology;  Laterality: N/A;  . REFRACTIVE SURGERY    . SHOULDER ARTHROSCOPY Right 2007   x2   Social History  Substance Use Topics  . Smoking status: Former Smoker    Packs/day: 0.25    Years: 44.00    Types: Cigarettes    Quit date: 05/24/2007  . Smokeless tobacco: Never Used  . Alcohol use No   Family History  Problem Relation Age  of Onset  . Alcoholism Father   . Cancer Cousin   . Cancer Cousin   . Cancer Cousin      Current medication list and allergy/intolerance information reviewed:   Current Outpatient Prescriptions on File Prior to Visit  Medication Sig Dispense Refill  . amitriptyline (ELAVIL) 25 MG tablet Take 1 tablet (25 mg total) by mouth at bedtime. 30 tablet 0  . brimonidine (ALPHAGAN P) 0.1 % SOLN 1 drop.    . cetirizine (ZYRTEC) 10 MG tablet Take 10 mg by mouth daily.    . dorzolamide-timolol (COSOPT) 22.3-6.8 MG/ML ophthalmic solution 1 drop.    Marland Kitchen doxycycline (MONODOX) 100 MG capsule Take 1 capsule (100 mg total) by mouth 2 (two) times daily. Fill if infection worsens 14 capsule 0  . metFORMIN (GLUCOPHAGE) 1000 MG tablet Tab every evening for blood sugar control. 90 tablet 4  . montelukast (SINGULAIR) 10 MG tablet Take 1 tablet (10 mg total) by mouth at bedtime. For sinus pain. 30 tablet 3  . sildenafil (REVATIO) 20 MG tablet Take 1-3 tablets (20-60 mg total) by mouth daily as needed (sex). 50 tablet 2  . Travoprost (TRAVATAN OP) Apply to eye.     No current facility-administered medications on file prior to visit.    Allergies  Allergen Reactions  . Aspirin     Gi bleeding  . Clindamycin/Lincomycin     rash      Review of Systems:  Constitutional: No recent illness  HEENT: No  headache, +vision change  Cardiac: No  chest pain, No  pressure, No palpitations  Respiratory:  +shortness of breath. No  Cough  Gastrointestinal: No  abdominal pain, no change on bowel habits  Musculoskeletal: No new myalgia/arthralgia  Skin: No  Rash  Hem/Onc: No  easy bruising/bleeding, No  abnormal lumps/bumps  Neurologic: +weakness, +Dizziness  Exam:  BP (!) 150/78   Temp 98.2 F (36.8 C) (Oral)   Ht 6\' 3"  (1.905 m)   Wt 222 lb (100.7 kg)   BMI 27.75 kg/m   Orthostatic VS for the past 24 hrs (Last 3 readings):  BP- Lying Pulse- Lying BP- Sitting Pulse- Sitting BP- Standing at 3 minutes  Pulse- Standing at 3 minutes  06/03/16 1123 (!) 161/91 87 138/85 94 121/83 103    Constitutional: VS see above. General Appearance: alert, well-developed, well-nourished, NAD  Eyes: Normal lids and conjunctive, non-icteric sclera  Ears, Nose, Mouth, Throat: MMM, Normal external inspection ears/nares/mouth/lips/gums.  Neck: No masses, trachea midline.   Respiratory: Normal respiratory effort. no wheeze, no rhonchi, no rales  Cardiovascular: S1/S2 normal, no murmur, no rub/gallop auscultated. RRR.   Musculoskeletal: Gait normal. Symmetric and independent movement of all extremities  Neurological: Normal balance/coordination. No tremor.  Skin: warm, dry, intact.   Psychiatric: Normal judgment/insight. Normal mood and affect. Oriented x3.    Results for orders placed or performed in visit on 06/03/16 (from the past 72 hour(s))  POCT HgB A1C     Status: None   Collection Time: 06/03/16 11:19 AM  Result Value Ref Range   Hemoglobin A1C 11.5   POCT glucose (manual entry)     Status: None   Collection Time: 06/03/16 11:48 AM  Result Value Ref Range   POC Glucose HHH 70 - 99 mg/dl    Comment: UNABLE TO DETECT DUE TO ELEVATED LEVEL  POCT Urinalysis Dipstick     Status: None   Collection Time: 06/03/16 11:50 AM  Result Value Ref Range   Color, UA YELLOW    Clarity, UA CLEAR    Glucose, UA >1,000    Bilirubin, UA NEGATIVE    Ketones, UA NEGATIVE    Spec Grav, UA <=1.005    Blood, UA NEGATIVE    pH, UA 5.5    Protein, UA NEGATIVE    Urobilinogen, UA 0.2    Nitrite, UA NEGATIVE    Leukocytes, UA Negative Negative      ASSESSMENT/PLAN: Gave patient option to try for IV fluid treatment and await labs in the office, versus go to ER for prompt evaluation and treatment. I'm concerned about hyperosmolar hyperglycemic state. Negative ketones in urine, I'm not sure if this is affected by significant dilation of the urine but of course consideration for DKA as well. I would be more  reassured if we were able to measure blood sugar at reasonable level but his blood glucose is too high for glucometer to read. Orthostatic hypotension also indicates likely significant dehydration. Patient opts to go to emergency room, I called and spoke to the [facility] triage desk and alerted them that he would be coming by private vehicle and appraised him of the situation.   Uncontrolled type 2 diabetes mellitus with hyperglycemia, without long-term current use of insulin ([place]) - Plan: POCT HgB A1C, POCT glucose (manual entry), POCT Urinalysis Dipstick, CANCELED: Hemoglobin A1c, CANCELED: CBC with Differential/Platelet, CANCELED: COMPLETE METABOLIC PANEL WITH GFR, CANCELED: Lipid panel, CANCELED: TSH  Abnormal laboratory test result - Plan:  CANCELED: Hemoglobin A1c  Open wound of great toe, left, initial encounter - new data for more severe DM - wound care to continue, does not appear infected  Essential hypertension  Orthostatic hypotension  Glaucoma of both eyes, unspecified glaucoma type - Plan: Brinzolamide-Brimonidine 1-0.2 % SUSP, timolol (TIMOPTIC) 0.5 % ophthalmic solution, ketotifen (ZADITOR) 0.025 % ophthalmic solution  Other chronic allergic conjunctivitis of both eyes - Plan: latanoprost (XALATAN) 0.005 % ophthalmic solution    All questions at time of visit were answered - patient instructed to contact office with any additional concerns. ER/RTC precautions were reviewed with the patient. Follow-up plan: Return in about 3 days (around 06/06/2016) for post-ER/hospitalization visit.  Note: Total time spent 40 minutes, greater than 50% of the visit was spent face-to-face counseling and coordinating care for the following: The primary encounter diagnosis was Uncontrolled type 2 diabetes mellitus with hyperglycemia, without long-term current use of insulin ([place]). A diagnosis of Abnormal laboratory test result was also pertinent to this visit.Marland Kitchen

## 2016-06-04 DIAGNOSIS — E1165 Type 2 diabetes mellitus with hyperglycemia: Secondary | ICD-10-CM | POA: Diagnosis not present

## 2016-06-04 DIAGNOSIS — E1101 Type 2 diabetes mellitus with hyperosmolarity with coma: Secondary | ICD-10-CM | POA: Diagnosis not present

## 2016-06-05 DIAGNOSIS — E1101 Type 2 diabetes mellitus with hyperosmolarity with coma: Secondary | ICD-10-CM | POA: Diagnosis not present

## 2016-06-05 DIAGNOSIS — E1165 Type 2 diabetes mellitus with hyperglycemia: Secondary | ICD-10-CM | POA: Diagnosis not present

## 2016-06-06 ENCOUNTER — Ambulatory Visit: Payer: Medicare Other | Admitting: Osteopathic Medicine

## 2016-06-07 ENCOUNTER — Ambulatory Visit (INDEPENDENT_AMBULATORY_CARE_PROVIDER_SITE_OTHER): Payer: Medicare Other | Admitting: Physician Assistant

## 2016-06-07 ENCOUNTER — Encounter: Payer: Self-pay | Admitting: Physician Assistant

## 2016-06-07 VITALS — BP 137/78 | HR 66 | Ht 75.0 in | Wt 225.0 lb

## 2016-06-07 DIAGNOSIS — E1100 Type 2 diabetes mellitus with hyperosmolarity without nonketotic hyperglycemic-hyperosmolar coma (NKHHC): Secondary | ICD-10-CM | POA: Diagnosis not present

## 2016-06-07 NOTE — Progress Notes (Signed)
Subjective:    Patient ID: Calvin Wise, male    DOB: [birth year], 70 y.o.   MRN: [id number]  HPI   Pt is a 70 yo male who presents to the clinic to follow up from hospital  Hyperosmolar hyperosmotic non-ketotic state in the setting of DM II - BS was 850 on admission. No evidence of DKA. A1c was 11.1 from 7.4 in August. He was treated with IVFs and started on long-acting insulin. He was educated on checking his BS and using insulin and was able to administer insulin himself prior to discharge. He was started on Lantus 20 u daily with BS still ranging in the 300s so this will be increased to 30 u daily on discharge and can continue to be adjusted as an outpatient. He was instructed to call his PCP with BS readings consistently >300 or <70. He will have close f/u with his PCP.   Discharge instructions  Order Comments: I have given you prescriptions for everything you need to test your blood sugar and administer insulin. You will give yourself 30 units of insulin every morning. You should check your blood sugar at least 2 times per day. Please call your primary doctor if your blood sugar is persistently >300 or <70. You will need to follow-up with your primary doctor sometime this week.   .. Active Ambulatory Problems    Diagnosis Date Noted  . Essential hypertension 04/17/2012  . Left rotator cuff tear 04/17/2012  . Sciatica 04/17/2012  . Glaucoma 05/02/2012  . Irregular heartbeat (Declines Workup 09/2011 [facility]) 05/02/2012  . Erectile dysfunction 05/02/2012  . Uncontrolled type 2 diabetes mellitus with hyperosmolarity without coma ([place]) 07/04/2012  . TMJ syndrome 02/11/2013  . Colon polyp 10/30/2013  . Prostate cancer ([place]) 06/11/2014  . Open wound of great toe, left, initial encounter 06/03/2016   Resolved Ambulatory Problems    Diagnosis Date Noted  . No Resolved Ambulatory Problems   Past Medical History:  Diagnosis Date  . Diabetes mellitus without complication ([place])   .  Erectile dysfunction 05/02/2012  . Glaucoma 05/02/2012  . History of GI bleed 2013  . Irregular heartbeat (Declines Workup 09/2011 [facility]) 05/02/2012  . Left rotator cuff tear 04/17/2012  . Prostate cancer ([place])      Pt reports fasting sugar this morning was 259. His first dose of insulin was last night of 30 units of basaglar. He had no problem with shot. He denies any CP, palpitations, SOB, polyuria, polydipsia. He has no complaints today. He continues with metformin 1000mg  once a day.         Review of Systems  All other systems reviewed and are negative.      Objective:   Physical Exam  Constitutional: He is oriented to person, place, and time. He appears well-developed and well-nourished.  HENT:  Head: Normocephalic and atraumatic.  Cardiovascular: Normal rate, regular rhythm and normal heart sounds.   Pulmonary/Chest: Effort normal and breath sounds normal.  Neurological: He is alert and oriented to person, place, and time.  Psychiatric: He has a normal mood and affect. His behavior is normal.          Assessment & Plan:  Marland KitchenMarland KitchenDiagnoses and all orders for this visit:  Uncontrolled type 2 diabetes mellitus with hyperosmolarity without coma, unspecified long term insulin use status ([place])   Pt is doing very well post hospital follow up.  He had no problem with last night insulin dose.  Continue basaglar 30 units at  bedtime may increase by 2 units every 3-5 days until fasting glucose 120 or less. Do not exceed 45 units of insulin.  Increased metformin to 1000mg  bid.  Discussed with patient there are other oral medications that might be added to decrease insulin load.  Discussed hypoglycemia and what to look for.  Follow up with PcP in 1 month.

## 2016-06-07 NOTE — Patient Instructions (Addendum)
Increase metoformin '1000mg'$  twice a day.  Continue insulin at bedtime 30 units may increase by 2 units every 5 days until morning sugars are around 120 then stop with maximum dose of 45 units of insulin.    Hypoglycemia  Hypoglycemia is when the sugar (glucose) level in the blood is too low. Symptoms of low blood sugar may include:  Feeling:  Hungry.  Worried or nervous (anxious).  Sweaty and clammy.  Confused.  Dizzy.  Sleepy.  Sick to your stomach (nauseous).  Having:  A fast heartbeat.  A headache.  A change in your vision.  Jerky movements that you cannot control (seizure).  Nightmares.  Tingling or no feeling (numbness) around the mouth, lips, or tongue.  Having trouble with:  Talking.  Paying attention (concentrating).  Moving (coordination).  Sleeping.  Shaking.  Passing out (fainting).  Getting upset easily (irritability). Low blood sugar can happen to people who have diabetes and people who do not have diabetes. Low blood sugar can happen quickly, and it can be an emergency. Treating Low Blood Sugar  Low blood sugar is often treated by eating or drinking something sugary right away. If you can think clearly and swallow safely, follow the 15:15 rule:  Take 15 grams of a fast-acting carb (carbohydrate). Some fast-acting carbs are:  1 tube of glucose gel.  3 sugar tablets (glucose pills).  6-8 pieces of hard candy.  4 oz (120 mL) of fruit juice.  4 oz (120 mL) of regular (not diet) soda.  Check your blood sugar 15 minutes after you take the carb.  If your blood sugar is still at or below 70 mg/dL (3.9 mmol/L), take 15 grams of a carb again.  If your blood sugar does not go above 70 mg/dL (3.9 mmol/L) after 3 tries, get help right away.  After your blood sugar goes back to normal, eat a meal or a snack within 1 hour. Treating Very Low Blood Sugar  If your blood sugar is at or below 54 mg/dL (3 mmol/L), you have very low blood sugar  (severe hypoglycemia). This is an emergency. Do not wait to see if the symptoms will go away. Get medical help right away. Call your local emergency services (911 in the [place] Do not drive yourself to the hospital. If you have very low blood sugar and you cannot eat or drink, you may need a glucagon shot (injection). A family member or friend should learn how to check your blood sugar and how to give you a glucagon shot. Ask your doctor if you need to have a glucagon shot kit at home. Follow these instructions at home: General instructions  Avoid any diets that cause you to not eat enough food. Talk with your doctor before you start any new diet.  Take over-the-counter and prescription medicines only as told by your doctor.  Limit alcohol to no more than 1 drink per day for nonpregnant women and 2 drinks per day for men. One drink equals 12 oz of beer, 5 oz of wine, or 1 oz of hard liquor.  Keep all follow-up visits as told by your doctor. This is important. If You Have Diabetes:   Make sure you know the symptoms of low blood sugar.  Always keep a source of sugar with you, such as:  Sugar.  Sugar tablets.  Glucose gel.  Fruit juice.  Regular soda (not diet soda).  Milk.  Hard candy.  Honey.  Take your medicines as told.  Follow your  exercise and meal plan.  Eat on time. Do not skip meals.  Follow your sick day plan when you cannot eat or drink normally. Make this plan ahead of time with your doctor.  Check your blood sugar as often as told by your doctor. Always check before and after exercise.  Share your diabetes care plan with:  Your work or school.  People you live with.  Check your pee (urine) for ketones:  When you are sick.  As told by your doctor.  Carry a card or wear jewelry that says you have diabetes. If You Have Low Blood Sugar From Other Causes:   Check your blood sugar as often as told by your doctor.  Follow instructions from your doctor  about what you cannot eat or drink. Contact a doctor if:  You have trouble keeping your blood sugar in your target range.  You have low blood sugar often. Get help right away if:  You still have symptoms after you eat or drink something sugary.  Your blood sugar is at or below 54 mg/dL (3 mmol/L).  You have jerky movements that you cannot control.  You pass out. These symptoms may be an emergency. Do not wait to see if the symptoms will go away. Get medical help right away. Call your local emergency services (911 in the [place] Do not drive yourself to the hospital.  This information is not intended to replace advice given to you by your health care provider. Make sure you discuss any questions you have with your health care provider. Document Released: 10/05/2009 Document Revised: 12/17/2015 Document Reviewed: 08/14/2015 Elsevier Interactive Patient Education  2017 [employer].

## 2016-06-29 ENCOUNTER — Ambulatory Visit: Payer: Medicare Other | Admitting: Osteopathic Medicine

## 2016-06-29 ENCOUNTER — Encounter: Payer: Self-pay | Admitting: Family Medicine

## 2016-06-29 ENCOUNTER — Ambulatory Visit (INDEPENDENT_AMBULATORY_CARE_PROVIDER_SITE_OTHER): Payer: Medicare Other | Admitting: Family Medicine

## 2016-06-29 VITALS — BP 136/64 | HR 55 | Wt 234.0 lb

## 2016-06-29 DIAGNOSIS — M5432 Sciatica, left side: Secondary | ICD-10-CM | POA: Diagnosis not present

## 2016-06-29 MED ORDER — GABAPENTIN 300 MG PO CAPS: 300.0000 mg | ORAL_CAPSULE | Freq: Three times a day (TID) | ORAL | 0 refills | Status: DC | PRN

## 2016-06-29 NOTE — Progress Notes (Signed)
Calvin Wise is a 70 y.o. male who presents to [facility] today for sciatica. Patient has a history in the past of left-sided sciatica. He is doing well until a few days ago when he developed pain in his buttocks radiating to the lateral thigh and interdigital space between the first and second toe. The pain is worse with forward flexion and coughing. He denies any bowel or bladder dysfunction weakness or numbness. He notes the pain is improving today compared to yesterday. He has used one of his old Norco pills which has helped his pain. He feels well otherwise.   Past Medical History:  Diagnosis Date  . Diabetes mellitus without complication ([place])    Type II  . Erectile dysfunction 05/02/2012  . Glaucoma 05/02/2012  . History of GI bleed 2013   Related to ASA use  . Irregular heartbeat (Declines Workup 09/2011 [facility]) 05/02/2012  . Left rotator cuff tear 04/17/2012  . Prostate cancer [facility])    prostate   Past Surgical History:  Procedure Laterality Date  . BACK SURGERY  1997  . HAND SURGERY Bilateral 20 YEARS AGO  . HERNIA REPAIR    . PROSTATE BIOPSY    . RADIOACTIVE SEED IMPLANT N/A 09/19/2014   Procedure: RADIOACTIVE SEED IMPLANT;  Surgeon: Ardis Hughs, MD;  Location: [facility];  Service: Urology;  Laterality: N/A;  . REFRACTIVE SURGERY    . SHOULDER ARTHROSCOPY Right 2007   x2   Social History  Substance Use Topics  . Smoking status: Former Smoker    Packs/day: 0.25    Years: 44.00    Types: Cigarettes    Quit date: 05/24/2007  . Smokeless tobacco: Never Used  . Alcohol use No     ROS:  As above   Medications: Current Outpatient Prescriptions  Medication Sig Dispense Refill  . Brinzolamide-Brimonidine 1-0.2 % SUSP Apply 1 drop to eye 3 (three) times daily.    . cetirizine (ZYRTEC) 10 MG tablet Take 10 mg by mouth daily.    . dorzolamide-timolol (COSOPT) 22.3-6.8 MG/ML ophthalmic solution 1  drop.    Marland Kitchen doxycycline (MONODOX) 100 MG capsule Take 1 capsule (100 mg total) by mouth 2 (two) times daily. Fill if infection worsens (Patient not taking: Reported on 06/03/2016) 14 capsule 0  . gabapentin (NEURONTIN) 300 MG capsule Take 1 capsule (300 mg total) by mouth 3 (three) times daily as needed (back pain). 90 capsule 0  . ketotifen (ZADITOR) 0.025 % ophthalmic solution Place 1 drop into both eyes 2 (two) times daily as needed (itchy eyes).    Marland Kitchen latanoprost (XALATAN) 0.005 % ophthalmic solution 1 drop at bedtime.    . metFORMIN (GLUCOPHAGE) 1000 MG tablet Tab every evening for blood sugar control. 90 tablet 4  . montelukast (SINGULAIR) 10 MG tablet Take 1 tablet (10 mg total) by mouth at bedtime. For sinus pain. 30 tablet 3  . sildenafil (REVATIO) 20 MG tablet Take 1-3 tablets (20-60 mg total) by mouth daily as needed (sex). 50 tablet 2  . timolol (TIMOPTIC) 0.5 % ophthalmic solution Place 1 drop into both eyes daily.     No current facility-administered medications for this visit.    Allergies  Allergen Reactions  . Aspirin     Gi bleeding  . Clindamycin/Lincomycin     rash     Exam:  BP 136/64   Pulse (!) 55   Wt 234 lb (106.1 kg)   BMI 29.25 kg/m  General: Well Developed, well nourished, and in no acute distress.  Neuro/Psych: Alert and oriented x3, extra-ocular muscles intact, able to move all 4 extremities, sensation grossly intact. Skin: Warm and dry, no rashes noted.  Respiratory: Not using accessory muscles, speaking in full sentences, trachea midline.  Cardiovascular: Pulses palpable, no extremity edema. Abdomen: Does not appear distended. MSK: L spine: Nontender. Normal lumbar motion. Lower sugar strength reflexes and sensation are equal and normal throughout. Positive left-sided slump test. Normal gait.    No results found for this or any previous visit (from the past 48 hour(s)). No results found.    Assessment and Plan: 70 y.o. male with lumbar  radiculopathy likely left L5 nerve distribution. Use treatment options. Plan to avoid steroids due to history of poorly controlled diabetes. We'll use low-dose gabapentin intermittently as needed. Additionally patient declined referral to physical therapy. Plan for watchful waiting and return to clinic if worsening.   No orders of the defined types were placed in this encounter.   Discussed warning signs or symptoms. Please see discharge instructions. Patient expresses understanding.

## 2016-06-29 NOTE — Patient Instructions (Signed)
Thank you for coming in today. Take gabapentin mostly at night.  Use it up to 3x daily for pain.  Return if worse.  Come back or go to the emergency room if you notice new weakness new numbness problems walking or bowel or bladder problems.   Sciatica Introduction Sciatica is pain, numbness, weakness, or tingling along your sciatic nerve. The sciatic nerve starts in the lower back and goes down the back of each leg. Sciatica happens when this nerve is pinched or has pressure put on it. Sciatica usually goes away on its own or with treatment. Sometimes, sciatica may keep coming back (recur). Follow these instructions at home: Medicines  Take over-the-counter and prescription medicines only as told by your doctor.  Do not drive or use heavy machinery while taking prescription pain medicine. Managing pain  If directed, put ice on the affected area.  Put ice in a plastic bag.  Place a towel between your skin and the bag.  Leave the ice on for 20 minutes, 2-3 times a day.  After icing, apply heat to the affected area before you exercise or as often as told by your doctor. Use the heat source that your doctor tells you to use, such as a moist heat pack or a heating pad.  Place a towel between your skin and the heat source.  Leave the heat on for 20-30 minutes.  Remove the heat if your skin turns bright red. This is especially important if you are unable to feel pain, heat, or cold. You may have a greater risk of getting burned. Activity  Return to your normal activities as told by your doctor. Ask your doctor what activities are safe for you.  Avoid activities that make your sciatica worse.  Take short rests during the day. Rest in a lying or standing position. This is usually better than sitting to rest.  When you rest for a long time, do some physical activity or stretching between periods of rest.  Avoid sitting for a long time without moving. Get up and move around at least  one time each hour.  Exercise and stretch regularly, as told by your doctor.  Do not lift anything that is heavier than 10 lb (4.5 kg) while you have symptoms of sciatica.  Avoid lifting heavy things even when you do not have symptoms.  Avoid lifting heavy things over and over.  When you lift objects, always lift in a way that is safe for your body. To do this, you should:  Bend your knees.  Keep the object close to your body.  Avoid twisting. General instructions  Use good posture.  Avoid leaning forward when you are sitting.  Avoid hunching over when you are standing.  Stay at a healthy weight.  Wear comfortable shoes that support your feet. Avoid wearing high heels.  Avoid sleeping on a mattress that is too soft or too hard. You might have less pain if you sleep on a mattress that is firm enough to support your back.  Keep all follow-up visits as told by your doctor. This is important. Contact a doctor if:  You have pain that:  Wakes you up when you are sleeping.  Gets worse when you lie down.  Is worse than the pain you have had in the past.  Lasts longer than 4 weeks.  You lose weight for without trying. Get help right away if:  You cannot control when you pee (urinate) or poop (have a bowel movement).  You have weakness in any of these areas and it gets worse.  Lower back.  Lower belly (pelvis).  Butt (buttocks).  Legs.  You have redness or swelling of your back.  You have a burning feeling when you pee. This information is not intended to replace advice given to you by your health care provider. Make sure you discuss any questions you have with your health care provider. Document Released: 04/19/2008 Document Revised: 12/17/2015 Document Reviewed: 03/20/2015  2017 Elsevier

## 2016-07-06 ENCOUNTER — Ambulatory Visit (INDEPENDENT_AMBULATORY_CARE_PROVIDER_SITE_OTHER): Payer: Medicare Other | Admitting: Osteopathic Medicine

## 2016-07-06 ENCOUNTER — Encounter: Payer: Self-pay | Admitting: Osteopathic Medicine

## 2016-07-06 VITALS — BP 155/80 | HR 61 | Wt 235.0 lb

## 2016-07-06 DIAGNOSIS — E1165 Type 2 diabetes mellitus with hyperglycemia: Secondary | ICD-10-CM

## 2016-07-06 DIAGNOSIS — I10 Essential (primary) hypertension: Secondary | ICD-10-CM | POA: Diagnosis not present

## 2016-07-06 DIAGNOSIS — Z794 Long term (current) use of insulin: Secondary | ICD-10-CM | POA: Diagnosis not present

## 2016-07-06 LAB — POCT GLYCOSYLATED HEMOGLOBIN (HGB A1C): Hemoglobin A1C: 10.5

## 2016-07-06 MED ORDER — GLUCOSE BLOOD VI STRP: ORAL_STRIP | 99 refills | Status: DC

## 2016-07-06 MED ORDER — METFORMIN HCL 1000 MG PO TABS: 1000.0000 mg | ORAL_TABLET | Freq: Two times a day (BID) | ORAL | 3 refills | Status: DC

## 2016-07-06 NOTE — Progress Notes (Signed)
HPI: Calvin Wise is a 70 y.o. male  who presents to [facility] today, 07/06/16,  for chief complaint of:  Chief Complaint  Patient presents with  . Follow-up    WOUND CHECK AND DIABETES    Diabetes follow-up: Recently diagnosed with insulin-dependent type 2 diabetes, hyperosmolar nonketotic state. Patient has been doing well with insulin injections. He is currently on 30 units per day, fasting sugars typically in the 120s but often down as low as 80s. He is having occasional hypoglycemic episodes into the 60s, this has happened 3 times, he is symptomatic when it happens, feeling weak/flushed. Endorses compliance with diabetic diet. He states that when he started [facility] she instructed him to take the metformin 1000 mg twice per day, since he is started having some low blood sugars he has backed off on this to half a tablet twice daily.  Wound: Patient states has healed up well, nonpainful, no problems.  Essential hypertension: Blood pressure a bit elevated today, somewhat improved on manual recheck, this is the first time that it has been above the 140/90 goal. No chest pain, pressure, shortness of breath, dizziness.  Past medical, surgical, social and family history reviewed: Patient Active Problem List   Diagnosis Date Noted  . Open wound of great toe, left, initial encounter 06/03/2016  . Prostate cancer ([place]) 06/11/2014  . Colon polyp 10/30/2013  . TMJ syndrome 02/11/2013  . Uncontrolled type 2 diabetes mellitus with hyperosmolarity without coma ([place]) 07/04/2012  . Glaucoma 05/02/2012  . Irregular heartbeat (Declines Workup 09/2011 [facility]) 05/02/2012  . Erectile dysfunction 05/02/2012  . Essential hypertension 04/17/2012  . Left rotator cuff tear 04/17/2012  . Sciatica 04/17/2012   Past Surgical History:  Procedure Laterality Date  . BACK SURGERY  1997  . HAND SURGERY Bilateral 20 YEARS AGO  . HERNIA REPAIR    . PROSTATE BIOPSY    .  RADIOACTIVE SEED IMPLANT N/A 09/19/2014   Procedure: RADIOACTIVE SEED IMPLANT;  Surgeon: Ardis Hughs, MD;  Location: [facility];  Service: Urology;  Laterality: N/A;  . REFRACTIVE SURGERY    . SHOULDER ARTHROSCOPY Right 2007   x2   Social History  Substance Use Topics  . Smoking status: Former Smoker    Packs/day: 0.25    Years: 44.00    Types: Cigarettes    Quit date: 05/24/2007  . Smokeless tobacco: Never Used  . Alcohol use No   Family History  Problem Relation Age of Onset  . Alcoholism Father   . Cancer Cousin   . Cancer Cousin   . Cancer Cousin      Current medication list and allergy/intolerance information reviewed:   Current Outpatient Prescriptions on File Prior to Visit  Medication Sig Dispense Refill  . Brinzolamide-Brimonidine 1-0.2 % SUSP Apply 1 drop to eye 3 (three) times daily.    . cetirizine (ZYRTEC) 10 MG tablet Take 10 mg by mouth daily.    . dorzolamide-timolol (COSOPT) 22.3-6.8 MG/ML ophthalmic solution 1 drop.    Marland Kitchen gabapentin (NEURONTIN) 300 MG capsule Take 1 capsule (300 mg total) by mouth 3 (three) times daily as needed (back pain). 90 capsule 0  . ketotifen (ZADITOR) 0.025 % ophthalmic solution Place 1 drop into both eyes 2 (two) times daily as needed (itchy eyes).    Marland Kitchen latanoprost (XALATAN) 0.005 % ophthalmic solution 1 drop at bedtime.    . metFORMIN (GLUCOPHAGE) 1000 MG tablet Tab every evening for blood sugar control. [place]  tablet 4  . montelukast (SINGULAIR) 10 MG tablet Take 1 tablet (10 mg total) by mouth at bedtime. For sinus pain. 30 tablet 3  . sildenafil (REVATIO) 20 MG tablet Take 1-3 tablets (20-60 mg total) by mouth daily as needed (sex). 50 tablet 2  . timolol (TIMOPTIC) 0.5 % ophthalmic solution Place 1 drop into both eyes daily.     No current facility-administered medications on file prior to visit.    Allergies  Allergen Reactions  . Aspirin     Gi bleeding  . Clindamycin/Lincomycin     rash      Review  of Systems:  Constitutional: No recent illness  HEENT: No  headache, no vision change  Cardiac: No  chest pain, No  pressure  Skin: No  Rash, wounds or other concerning lesions  Neurologic: No  Weakness or Dizziness unless Glc low as per HPI   Exam:  BP (!) 155/80   Pulse 61   Wt 235 lb (106.6 kg)   BMI 29.37 kg/m   Constitutional: VS see above. General Appearance: alert, well-developed, well-nourished, NAD  Eyes: Normal lids and conjunctive, non-icteric sclera  Ears, Nose, Mouth, Throat: MMM, Normal external inspection ears/nares/mouth/lips/gums.  Neck: No masses, trachea midline.   Respiratory: Normal respiratory effort. no wheeze, no rhonchi, no rales  Cardiovascular: S1/S2 normal, no murmur, no rub/gallop auscultated. RRR.   Musculoskeletal: Gait normal. Symmetric and independent movement of all extremities  Neurological: Normal balance/coordination. No tremor.  Skin: warm, dry, intact.   Psychiatric: Normal judgment/insight. Normal mood and affect. Oriented x3.      ASSESSMENT/PLAN:   Type 2 diabetes mellitus with hyperglycemia, with long-term current use of insulin (HCC) - Plan: POCT HgB A1C, glucose blood test strip, metFORMIN (GLUCOPHAGE) 1000 MG tablet  Essential hypertension - This is the first time that has been above goal, patient has gained weight. Recheck at next visit, consider medication adjustment if still elevated    Patient Instructions   Let's back off on the Insulin to 25 units daily, of sugars start going up again, let me know.   Can take Metformin 1000 mg twice per day  If sugars are dropping, call me!       Visit summary with medication list and pertinent instructions was printed for patient to review. All questions at time of visit were answered - patient instructed to contact office with any additional concerns. ER/RTC precautions were reviewed with the patient. Follow-up plan: Return in about 2 months (around 09/06/2016) for  DIABETES FOLLOW-UP.

## 2016-07-06 NOTE — Patient Instructions (Addendum)
   Let's back off on the Insulin to 25 units daily, of sugars start going up again, let me know.   Can take Metformin 1000 mg twice per day  If sugars are dropping, call me!

## 2016-08-14 ENCOUNTER — Encounter: Payer: Self-pay | Admitting: Osteopathic Medicine

## 2016-08-14 DIAGNOSIS — L600 Ingrowing nail: Secondary | ICD-10-CM | POA: Insufficient documentation

## 2016-08-15 ENCOUNTER — Telehealth: Payer: Self-pay

## 2016-08-16 NOTE — Telephone Encounter (Signed)
Opened in error

## 2016-09-07 ENCOUNTER — Encounter: Payer: Self-pay | Admitting: Osteopathic Medicine

## 2016-09-07 ENCOUNTER — Ambulatory Visit (INDEPENDENT_AMBULATORY_CARE_PROVIDER_SITE_OTHER): Payer: Medicare Other | Admitting: Osteopathic Medicine

## 2016-09-07 VITALS — BP 129/56 | HR 48 | Ht 75.0 in | Wt 232.0 lb

## 2016-09-07 DIAGNOSIS — E11649 Type 2 diabetes mellitus with hypoglycemia without coma: Secondary | ICD-10-CM

## 2016-09-07 DIAGNOSIS — I10 Essential (primary) hypertension: Secondary | ICD-10-CM | POA: Diagnosis not present

## 2016-09-07 DIAGNOSIS — S91102D Unspecified open wound of left great toe without damage to nail, subsequent encounter: Secondary | ICD-10-CM

## 2016-09-07 DIAGNOSIS — Z794 Long term (current) use of insulin: Secondary | ICD-10-CM

## 2016-09-07 LAB — POCT GLYCOSYLATED HEMOGLOBIN (HGB A1C): Hemoglobin A1C: 7

## 2016-09-07 NOTE — Patient Instructions (Signed)
Plan:  For Diabetes: Continue metformin 1000 mg twice per day. Let's reduce the dose of insulin to 20 units per day. If you are still having episodes of your blood sugar dropping, please call the office! Lets plan to follow-up here in the office to recheck the A1c levels in 3 months.  Regarding your care at the [place]: Please make sure that they are sending [place] records! Especially if they are doing additional testing or changing her medications, we need to know about this. Sometimes, [facility] patients will have a PCP within the [place] system as well as a PCP outside, this can create communication problems and medication errors which can compromise patient's safety, so we encourage you to choose one person who is going to be your primary care provider for chronic medical issues.

## 2016-09-07 NOTE — Progress Notes (Signed)
HPI: Calvin Wise is a 71 y.o. male  who presents to [facility] today, 09/07/16,  for chief complaint of:  Chief Complaint  Patient presents with  . Follow-up  . Diabetes    Diabetes follow-up: Recently diagnosed with insulin-dependent type 2 diabetes, hyperosmolar nonketotic state. Patient has been doing well with insulin injections. He was on 30 units per day, fasting sugars typically in the 120s but often down as low as 80s. He was having occasional hypoglycemic episodes into the 60s, this has happened 3 times, he is symptomatic when it happens, feeling weak/flushed. Insulin was changed to 25 units daily with instructions to take the Metformin 1000 twice per day. Today   Wound: Patient states has healed up well, nonpainful, no problems. He states the [place] did an MRI and had some concerns but he's not sure what is going.   Essential hypertension: Blood pressure a bit On the low side today, patient states that the [place] started it some kind of what he thinks is a blood pressure medication for him. He cannot recall the name. No records on file.    Past medical, surgical, social and family history reviewed: Patient Active Problem List   Diagnosis Date Noted  . Ingrowing toenail 08/14/2016  . Open wound of great toe, left, initial encounter 06/03/2016  . Prostate cancer ([place]) 06/11/2014  . Colon polyp 10/30/2013  . TMJ syndrome 02/11/2013  . Uncontrolled type 2 diabetes mellitus with hyperosmolarity without coma ([place]) 07/04/2012  . Glaucoma 05/02/2012  . Irregular heartbeat (Declines Workup 09/2011 [facility]) 05/02/2012  . Erectile dysfunction 05/02/2012  . Essential hypertension 04/17/2012  . Left rotator cuff tear 04/17/2012  . Sciatica 04/17/2012   Past Surgical History:  Procedure Laterality Date  . BACK SURGERY  1997  . HAND SURGERY Bilateral 20 YEARS AGO  . HERNIA REPAIR    . PROSTATE BIOPSY    . RADIOACTIVE SEED IMPLANT N/A 09/19/2014   Procedure: RADIOACTIVE SEED IMPLANT;  Surgeon: Ardis Hughs, MD;  Location: [facility];  Service: Urology;  Laterality: N/A;  . REFRACTIVE SURGERY    . SHOULDER ARTHROSCOPY Right 2007   x2   Social History  Substance Use Topics  . Smoking status: Former Smoker    Packs/day: 0.25    Years: 44.00    Types: Cigarettes    Quit date: 05/24/2007  . Smokeless tobacco: Never Used  . Alcohol use No   Family History  Problem Relation Age of Onset  . Alcoholism Father   . Cancer Cousin   . Cancer Cousin   . Cancer Cousin      Current medication list and allergy/intolerance information reviewed:   Current Outpatient Prescriptions on File Prior to Visit  Medication Sig Dispense Refill  . Brinzolamide-Brimonidine 1-0.2 % SUSP Apply 1 drop to eye 3 (three) times daily.    . cetirizine (ZYRTEC) 10 MG tablet Take 10 mg by mouth daily.    . dorzolamide-timolol (COSOPT) 22.3-6.8 MG/ML ophthalmic solution 1 drop.    Marland Kitchen gabapentin (NEURONTIN) 300 MG capsule Take 1 capsule (300 mg total) by mouth 3 (three) times daily as needed (back pain). 90 capsule 0  . glucose blood test strip Use as instructed 100 each 99  . ketotifen (ZADITOR) 0.025 % ophthalmic solution Place 1 drop into both eyes 2 (two) times daily as needed (itchy eyes).    Marland Kitchen latanoprost (XALATAN) 0.005 % ophthalmic solution 1 drop at bedtime.    . metFORMIN (  GLUCOPHAGE) 1000 MG tablet Take 1 tablet (1,000 mg total) by mouth 2 (two) times daily with a meal. 270 tablet 3  . montelukast (SINGULAIR) 10 MG tablet Take 1 tablet (10 mg total) by mouth at bedtime. For sinus pain. 30 tablet 3  . sildenafil (REVATIO) 20 MG tablet Take 1-3 tablets (20-60 mg total) by mouth daily as needed (sex). 50 tablet 2  . timolol (TIMOPTIC) 0.5 % ophthalmic solution Place 1 drop into both eyes daily.     No current facility-administered medications on file prior to visit.    Allergies  Allergen Reactions  . Aspirin     Gi bleeding  .  Clindamycin/Lincomycin     rash      Review of Systems:  Constitutional: No recent illness  HEENT: No  headache, no vision change  Cardiac: No  chest pain, No  pressure  Skin: No  Rash, wound healing well   Neurologic: No  Weakness or Dizziness unless Glc low as per HPI   Exam:  BP (!) 129/56   Pulse (!) 48   Ht 6\' 3"  (1.905 m)   Wt 232 lb (105.2 kg)   BMI 29.00 kg/m   Constitutional: VS see above. General Appearance: alert, well-developed, well-nourished, NAD  Eyes: Normal lids and conjunctive, non-icteric sclera  Ears, Nose, Mouth, Throat: MMM, Normal external inspection ears/nares/mouth/lips/gums.  Neck: No masses, trachea midline.   Respiratory: Normal respiratory effort. no wheeze, no rhonchi, no rales  Cardiovascular: S1/S2 normal, no murmur, no rub/gallop auscultated. RRR.   Musculoskeletal: Gait normal. Symmetric and independent movement of all extremities  Neurological: Normal balance/coordination. No tremor.  Skin: warm, dry, intact.   Psychiatric: Normal judgment/insight. Normal mood and affect. Oriented x3.    Results for orders placed or performed in visit on 09/07/16 (from the past 24 hour(s))  POCT HgB A1C     Status: None   Collection Time: 09/07/16  8:24 AM  Result Value Ref Range   Hemoglobin A1C 7.0      ASSESSMENT/PLAN:   Type 2 diabetes mellitus with hypoglycemia without coma, with long-term current use of insulin ([place]) - Plan: POCT HgB A1C  Open wound of left great toe, [facility] encounter - need [facility]  Essential hypertension - need [facility] records, ACE not started by me d/t concern for lower BP but I suspect this is what [facility] started    Patient Instructions  Plan:  For Diabetes: Continue metformin 1000 mg twice per day. Let's reduce the dose of insulin to 20 units per day. If you are still having episodes of your blood sugar dropping, please call the office! Lets plan to follow-up here in the office to recheck the A1c levels in  3 months.  Regarding your care at the [place]: Please make sure that they are sending [place] records! Especially if they are doing additional testing or changing her medications, we need to know about this. Sometimes, [facility] patients will have a PCP within the [place] system as well as a PCP outside, this can create communication problems and medication errors which can compromise patient's safety, so we encourage you to choose one person who is going to be your primary care provider for chronic medical issues.     Visit summary with medication list and pertinent instructions was printed for patient to review. All questions at time of visit were answered - patient instructed to contact office with any additional concerns. ER/RTC precautions were reviewed with the patient. Follow-up plan: Return in about 3  months (around 12/05/2016) for diabetes follow-up, sooner if needed.

## 2016-10-12 ENCOUNTER — Encounter: Payer: Self-pay | Admitting: Osteopathic Medicine

## 2016-10-12 ENCOUNTER — Ambulatory Visit (INDEPENDENT_AMBULATORY_CARE_PROVIDER_SITE_OTHER): Payer: Medicare Other | Admitting: Osteopathic Medicine

## 2016-10-12 VITALS — BP 140/55 | HR 55 | Ht 75.0 in | Wt 231.0 lb

## 2016-10-12 DIAGNOSIS — S161XXA Strain of muscle, fascia and tendon at neck level, initial encounter: Secondary | ICD-10-CM

## 2016-10-12 DIAGNOSIS — D170 Benign lipomatous neoplasm of skin and subcutaneous tissue of head, face and neck: Secondary | ICD-10-CM

## 2016-10-12 MED ORDER — NAPROXEN 500 MG PO TABS: 500.0000 mg | ORAL_TABLET | Freq: Two times a day (BID) | ORAL | 0 refills | Status: DC

## 2016-10-12 MED ORDER — CYCLOBENZAPRINE HCL 10 MG PO TABS: 5.0000 mg | ORAL_TABLET | Freq: Three times a day (TID) | ORAL | 0 refills | Status: DC | PRN
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
## 2016-10-13 ENCOUNTER — Telehealth: Payer: Self-pay | Admitting: *Deleted

## 2016-10-13 NOTE — Telephone Encounter (Signed)
Spoke with patient and he is  aware to only take flexeril. He wasn't taking tizanidine. I think this was an auto generated PA from the pharmacy.

## 2016-10-13 NOTE — Telephone Encounter (Signed)
Received a PA for tizanadine. Do you want this patient to be on both flexeril and tizanidine?

## 2016-10-13 NOTE — Telephone Encounter (Signed)
Nope, just flexeril

## 2016-10-19 ENCOUNTER — Ambulatory Visit (INDEPENDENT_AMBULATORY_CARE_PROVIDER_SITE_OTHER): Payer: Self-pay | Admitting: Rehabilitative and Restorative Service Providers"

## 2016-10-19 ENCOUNTER — Encounter: Payer: Self-pay | Admitting: Rehabilitative and Restorative Service Providers"

## 2016-10-19 DIAGNOSIS — R293 Abnormal posture: Secondary | ICD-10-CM

## 2016-10-19 DIAGNOSIS — M542 Cervicalgia: Secondary | ICD-10-CM

## 2016-10-19 DIAGNOSIS — R29898 Other symptoms and signs involving the musculoskeletal system: Secondary | ICD-10-CM

## 2016-10-19 NOTE — Patient Instructions (Addendum)
Shoulder Blade Squeeze    Rotate shoulders back, then squeeze shoulder blades down and back. Hold 10 sec Repeat _10___ times. Do _several___ sessions per day.   Axial Extension (Chin Tuck)    Pull chin in and lengthen back of neck. Hold _10-15___ seconds while counting out loud. Repeat __5-10__ times. Do __several__ sessions per day.   Self massage using about a 4 inch plastic ball     Scapular Retraction: Elbow Flexion (Standing)    With elbows bent to 90, pinch shoulder blades together and rotate arms out, keeping elbows bent. Repeat _10___ times per set. Do __1-2__ sets per session. Do __4-5__ sessions per day.    TENS UNIT: This is helpful for muscle pain and spasm.   Search and Purchase a TENS 7000 2nd edition at www.tenspros.com. It should be less than $30.     TENS unit instructions: Do not shower or bathe with the unit on Turn the unit off before removing electrodes or batteries If the electrodes lose stickiness add a drop of water to the electrodes after they are disconnected from the unit and place on plastic sheet. If you continued to have difficulty, call the TENS unit company to purchase more electrodes. Do not apply lotion on the skin area prior to use. Make sure the skin is clean and dry as this will help prolong the life of the electrodes. After use, always check skin for unusual red areas, rash or other skin difficulties. If there are any skin problems, does not apply electrodes to the same area. Never remove the electrodes from the unit by pulling the wires. Do not use the TENS unit or electrodes other than as directed. Do not change electrode placement without consultating your therapist or physician. Keep 2 fingers with between each electrode.
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
## 2016-10-26 ENCOUNTER — Ambulatory Visit (INDEPENDENT_AMBULATORY_CARE_PROVIDER_SITE_OTHER): Payer: Self-pay | Admitting: Rehabilitative and Restorative Service Providers"

## 2016-10-26 ENCOUNTER — Encounter: Payer: Self-pay | Admitting: Rehabilitative and Restorative Service Providers"

## 2016-10-26 DIAGNOSIS — M542 Cervicalgia: Secondary | ICD-10-CM

## 2016-10-26 DIAGNOSIS — R29898 Other symptoms and signs involving the musculoskeletal system: Secondary | ICD-10-CM

## 2016-10-26 DIAGNOSIS — R293 Abnormal posture: Secondary | ICD-10-CM

## 2016-10-26 NOTE — Therapy (Signed)
[place], [place], [address] Phone: [phone]   Fax:  [phone]  Physical Therapy Treatment  Patient Details  Name: Calvin Wise MRN: [id number] Date of Birth: 15-Jan-1946 Referring Provider: Dr [name]  Encounter Date: 10/26/2016      PT End of Session - 10/26/16 0933    Visit Number 2   Number of Visits 12   Date for PT Re-Evaluation 11/30/16   PT Start Time 0933   PT Stop Time 1027   PT Time Calculation (min) 54 min   Activity Tolerance Patient tolerated treatment well      Past Medical History:  Diagnosis Date  . Diabetes mellitus without complication ([place])    Type II  . Erectile dysfunction 05/02/2012  . Glaucoma 05/02/2012  . History of GI bleed 2013   Related to ASA use  . Irregular heartbeat (Declines Workup 09/2011 [facility]) 05/02/2012  . Left rotator cuff tear 04/17/2012  . Prostate cancer [facility])    prostate    Past Surgical History:  Procedure Laterality Date  . BACK SURGERY  1997  . HAND SURGERY Bilateral 20 YEARS AGO  . HERNIA REPAIR    . PROSTATE BIOPSY    . RADIOACTIVE SEED IMPLANT N/A 09/19/2014   Procedure: RADIOACTIVE SEED IMPLANT;  Surgeon: Ardis Hughs, MD;  Location: [facility];  Service: Urology;  Laterality: N/A;  . REFRACTIVE SURGERY    . SHOULDER ARTHROSCOPY Right 2007   x2    There were no vitals filed for this visit.      Subjective Assessment - 10/26/16 0934    Subjective Patient reports that his neck is feeling a lot better. He has been working on his posture and his exercises. He got a ball for home and has liked using the ball.    Currently in Pain? Yes   Pain Score 4    Pain Location Neck   Pain Orientation Right   Pain Descriptors / Indicators Aching;Tightness   Pain Type Acute pain   Pain Radiating Towards into Rt shoulder    Pain Onset 1 to 4 weeks ago   Pain Frequency Intermittent             [facility] PT Assessment - 10/26/16 0001      Assessment   Medical Diagnosis Cervical strain   Referring Provider Dr [name]   Onset Date/Surgical Date 10/09/16   Hand Dominance Right   Next MD Visit 5/18   Prior Therapy none for neck      AROM   Cervical - Right Rotation 73   Cervical - Left Rotation 72     Palpation   Palpation comment decreased muscular tightness bilat cervical - ant/lat/posterior; upper traps; leveator; pecs                      [facility] Adult PT Treatment/Exercise - 10/26/16 0001      Neck Exercises: Machines for Strengthening   UBE (Upper Arm Bike) L2 x 4 min alt fwd/back each minute      Neck Exercises: Standing   Neck Retraction 5 reps;10 secs  w/ noodle      Neck Exercises: Supine   Neck Retraction 5 reps;10 secs  on pillow      Shoulder Exercises: Standing   Extension Strengthening;Both;20 reps;Theraband  engaging posterior shoulder girdle    Theraband Level (Shoulder Extension) Level 2 (Red)   Row Strengthening;Both;20 reps;Theraband  engaging  posterior shoulder girdle    Theraband Level (Shoulder Row) Level 2 (Red)   Retraction Strengthening;Both;20 reps;Theraband  with swim noodle    Theraband Level (Shoulder Retraction) Level 2 (Red)   Other Standing Exercises W's for active thoracic extension    Other Standing Exercises scap squeeze with noodle 10 sec x 10      Shoulder Exercises: Stretch   Other Shoulder Stretches 3 way doorwar stretch 30 sec x 2 each position      Moist Heat Therapy   Number Minutes Moist Heat 20 Minutes   Moist Heat Location Cervical  thoracic     Electrical Stimulation   Electrical Stimulation Location bilat upper/lower cervical    Electrical Stimulation Action IFC   Electrical Stimulation Parameters to tolerance   Electrical Stimulation Goals Pain;Tone     Manual Therapy   Manual therapy comments pt supine    Joint Mobilization CPA mobs cervical spine Grade II/III   Soft tissue  mobilization bilat ant/lat/posterior cervical musculature; pecs; upper traps; leveator   Myofascial Release pecs; upper traps   Passive ROM cervical flexion; cervical flexion with slight rotation    Manual Traction 20-30 sec x 3 during treatment                 PT Education - 10/26/16 0952    Education provided Yes   Education Details HEP    Person(s) Educated Patient   Methods Explanation;Demonstration;Tactile cues;Verbal cues;Handout   Comprehension Verbalized understanding;Returned demonstration;Verbal cues required;Tactile cues required             PT Long Term Goals - 10/26/16 0937      PT LONG TERM GOAL #1   Title Improve posture and alignment with patient to demonstrate good position of head and neck on trunk thus decreasing strain through the posterior cervical musculature 11/30/16   Time 6   Period Weeks   Status On-going     PT LONG TERM GOAL #2   Title Increase cervical ROM by 5-8 degrees in all planes 11/30/16   Time 6   Period Weeks   Status On-going     PT LONG TERM GOAL #3   Title Patient reports that he can sleep throughtout the night without awakening due to neck pain 11/30/16   Time 6   Period Weeks   Status On-going     PT LONG TERM GOAL #4   Title 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 Added exercise today without difficulty. Tolerated manual work well with good release of tightness and improved mobility. Good gains in cervical rotation. Progressing well toward stated goals of therapy.    Rehab Potential Good    PT Frequency 2x / week   PT Duration 6 weeks   PT Treatment/Interventions Patient/family education;ADLs/Self Care Home Management;Neuromuscular re-education;Cryotherapy;Electrical Stimulation;Iontophoresis 4mg /ml Dexamethasone;Moist Heat;Traction;Ultrasound;Dry needling;Manual techniques;Therapeutic activities;Therapeutic exercise   PT  Next Visit Plan continue with postural correction; stretch pecs; add manual work for cervical musculature; modalities as indicated    Consulted and Agree with Plan of Care Patient      Patient will benefit from skilled therapeutic intervention in order to improve the following deficits and impairments:  Postural dysfunction, Improper body mechanics, Pain, Decreased range of motion, Decreased mobility, Increased fascial restricitons, Increased muscle spasms, Decreased activity tolerance  Visit Diagnosis: Cervicalgia  Other symptoms and signs involving the musculoskeletal system  Abnormal posture     Problem List Patient Active Problem List   Diagnosis Date Noted  . Lipoma of neck 10/12/2016  . Cervical strain 10/12/2016  . Ingrowing toenail 08/14/2016  . Open wound of great toe, left, initial encounter 06/03/2016  . Prostate cancer ([place]) 06/11/2014  . Colon polyp 10/30/2013  . TMJ syndrome 02/11/2013  . Uncontrolled type 2 diabetes mellitus with hyperosmolarity without coma ([place]) 07/04/2012  . Glaucoma 05/02/2012  . Irregular heartbeat (Declines Workup 09/2011 [facility]) 05/02/2012  . Erectile dysfunction 05/02/2012  . Essential hypertension 04/17/2012  . Left rotator cuff tear 04/17/2012  . Sciatica 04/17/2012    Haeli Gerlich Nilda Simmer PT, MPH  10/26/2016, 10:17 AM  [facility] [place], [place], [address] Phone: [phone]   Fax:  [phone]  Name: MIRAN KAUTZMAN MRN: [id number] Date of Birth: 09-05-1945

## 2016-10-26 NOTE — Patient Instructions (Signed)
Scapula Adduction With Pectoralis Stretch: Low - Standing   Shoulders at 45 hands even with shoulders, keeping weight through legs, shift weight forward until you feel pull or stretch through the front of your chest. Hold _30__ seconds. Do _3__ times, _2-4__ times per day.   Scapula Adduction With Pectoralis Stretch: Mid-Range - Standing   Shoulders at 90 elbows even with shoulders, keeping weight through legs, shift weight forward until you feel pull or strength through the front of your chest. Hold __30_ seconds. Do _3__ times, __2-4_ times per day.   Scapula Adduction With Pectoralis Stretch: High - Standing   Shoulders at 120 hands up high on the doorway, keeping weight on feet, shift weight forward until you feel pull or stretch through the front of your chest. Hold _30__ seconds. Do _3__ times, _2-3__ times per day.   Resisted External Rotation: in Neutral - Bilateral   PALMS UP Sit or stand, tubing in both hands, elbows at sides, bent to 90, forearms forward. Pinch shoulder blades together and rotate forearms out. Keep elbows at sides. Repeat __10__ times per set. Do _2-3___ sets per session. Do _2-3___ sessions per day.   Low Row: Standing   Face anchor, feet shoulder width apart. Palms up, pull arms back, squeezing shoulder blades together. Repeat 10__ times per set. Do 2-3__ sets per session. Do 2-3__ sessions per week. Anchor Height: Waist   Strengthening: Resisted Extension   Hold tubing in right hand, arm forward. Pull arm back, elbow straight. Repeat _10___ times per set. Do 2-3____ sets per session. Do 2-3____ sessions per day.   

## 2016-10-28 ENCOUNTER — Ambulatory Visit (INDEPENDENT_AMBULATORY_CARE_PROVIDER_SITE_OTHER): Payer: Self-pay | Admitting: Rehabilitative and Restorative Service Providers"

## 2016-10-28 ENCOUNTER — Encounter: Payer: Self-pay | Admitting: Rehabilitative and Restorative Service Providers"

## 2016-10-28 DIAGNOSIS — R293 Abnormal posture: Secondary | ICD-10-CM

## 2016-10-28 DIAGNOSIS — M542 Cervicalgia: Secondary | ICD-10-CM

## 2016-10-28 DIAGNOSIS — R29898 Other symptoms and signs involving the musculoskeletal system: Secondary | ICD-10-CM

## 2016-10-28 NOTE — Therapy (Addendum)
[place], [place], [address] Phone: [phone]   Fax:  [phone]  Physical Therapy Treatment  Patient Details  Name: Calvin Wise MRN: [id number] Date of Birth: Apr 23, 1946 Referring Provider: Dr [name]  Encounter Date: 10/28/2016      PT End of Session - 10/28/16 1037    Visit Number 3   Number of Visits 12   Date for PT Re-Evaluation 11/30/16   PT Start Time 0626   PT Stop Time 1121   PT Time Calculation (min) 46 min   Activity Tolerance Patient tolerated treatment well      Past Medical History:  Diagnosis Date  . Diabetes mellitus without complication ([place])    Type II  . Erectile dysfunction 05/02/2012  . Glaucoma 05/02/2012  . History of GI bleed 2013   Related to ASA use  . Irregular heartbeat (Declines Workup 09/2011 [facility]) 05/02/2012  . Left rotator cuff tear 04/17/2012  . Prostate cancer [name])    prostate    Past Surgical History:  Procedure Laterality Date  . BACK SURGERY  1997  . HAND SURGERY Bilateral 20 YEARS AGO  . HERNIA REPAIR    . PROSTATE BIOPSY    . RADIOACTIVE SEED IMPLANT N/A 09/19/2014   Procedure: RADIOACTIVE SEED IMPLANT;  Surgeon: Ardis Hughs, MD;  Location: [facility];  Service: Urology;  Laterality: N/A;  . REFRACTIVE SURGERY    . SHOULDER ARTHROSCOPY Right 2007   x2    There were no vitals filed for this visit.      Subjective Assessment - 10/28/16 1037    Subjective Patient reports that his neck is feeling a lot better. He has been working on his posture and his exercises. He got a ball for home and has liked using the ball.    Currently in Pain? No/denies                         [facility] Adult PT Treatment/Exercise - 10/28/16 0001      Neck Exercises: Machines for Strengthening   UBE (Upper Arm Bike) L3 x 4 min alt fwd/back each minute      Neck Exercises: Supine   Neck Retraction 5  reps;10 secs  on pillow      Shoulder Exercises: Standing   Extension Strengthening;Both;20 reps;Theraband  engaging posterior shoulder girdle    Theraband Level (Shoulder Extension) Level 2 (Red)   Row Strengthening;Both;20 reps;Theraband  engaging posterior shoulder girdle    Theraband Level (Shoulder Row) Level 2 (Red)   Retraction Strengthening;Both;20 reps;Theraband  with swim noodle    Theraband Level (Shoulder Retraction) Level 2 (Red)   Other Standing Exercises W's for active thoracic extension    Other Standing Exercises scap squeeze with noodle 10 sec x 10      Shoulder Exercises: Stretch   Other Shoulder Stretches 3 way doorwar stretch 30 sec x 2 each position      Moist Heat Therapy   Number Minutes Moist Heat 20 Minutes   Moist Heat Location Cervical  thoracic     Electrical Stimulation   Electrical Stimulation Location bilat upper/lower cervical    Electrical Stimulation Action IFC   Electrical Stimulation Parameters to tolerance   Electrical Stimulation Goals Pain;Tone     Manual Therapy   Manual therapy comments pt supine    Joint Mobilization CPA mobs cervical spine Grade II/III   Soft tissue mobilization bilat  ant/lat/posterior cervical musculature; pecs; upper traps; leveator   Myofascial Release pecs; upper traps   Passive ROM cervical flexion; cervical flexion with slight rotation    Manual Traction 20-30 sec x 3 during treatment                      PT Long Term Goals - 10/26/16 0937      PT LONG TERM GOAL #1   Title Improve posture and alignment with patient to demonstrate good position of head and neck on trunk thus decreasing strain through the posterior cervical musculature 11/30/16   Time 6   Period Weeks   Status On-going     PT LONG TERM GOAL #2   Title Increase cervical ROM by 5-8 degrees in all planes 11/30/16   Time 6   Period Weeks   Status On-going     PT LONG TERM GOAL #3   Title Patient reports that he can sleep  throughtout the night without awakening due to neck pain 11/30/16   Time 6   Period Weeks   Status On-going     PT LONG TERM GOAL #4   Title 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 No time to add new exercises today but reviewed current program. Patient is responding well to exercise and manual work followed by modalities. He will benefit from continued PT to reach maximum rehab potential.    Rehab Potential Good   PT Frequency 2x / week   PT Duration 6 weeks   PT Treatment/Interventions Patient/family education;ADLs/Self Care Home Management;Neuromuscular re-education;Cryotherapy;Electrical Stimulation;Iontophoresis 56m/ml Dexamethasone;Moist Heat;Traction;Ultrasound;Dry needling;Manual techniques;Therapeutic activities;Therapeutic exercise   PT Next Visit Plan continue with postural correction; stretch pecs; continue manual work for cervical musculature; modalities as indicated    Consulted and Agree with Plan of Care Patient      Patient will benefit from skilled therapeutic intervention in order to improve the following deficits and impairments:  Postural dysfunction, Improper body mechanics, Pain, Decreased range of motion, Decreased mobility, Increased fascial restricitons, Increased muscle spasms, Decreased activity tolerance  Visit Diagnosis: Cervicalgia  Other symptoms and signs involving the musculoskeletal system  Abnormal posture     Problem List Patient Active Problem List   Diagnosis Date Noted  . Lipoma of  neck 10/12/2016  . Cervical strain 10/12/2016  . Ingrowing toenail 08/14/2016  . Open wound of great toe, left, initial encounter 06/03/2016  . Prostate cancer ([place] 06/11/2014  . Colon polyp 10/30/2013  . TMJ syndrome 02/11/2013  . Uncontrolled type 2 diabetes mellitus with hyperosmolarity without coma ([place] 07/04/2012  . Glaucoma 05/02/2012  . Irregular heartbeat (Declines Workup 09/2011 [facility]) 05/02/2012  . Erectile dysfunction 05/02/2012  . Essential hypertension 04/17/2012  . Left rotator cuff tear 04/17/2012  . Sciatica 04/17/2012    Stelios Kirby P  Helene Kelp PT, MPH 10/28/2016, 11:03 AM  [facility] [place], [place], [address] Phone: [phone]   Fax:  [phone]  Name: Calvin Wise MRN: [id number] Date of Birth: [birth year]  PHYSICAL THERAPY DISCHARGE SUMMARY  Visits from Start of Care: 3  Current functional level related to goals / functional outcomes: See progress note for discharge status. Patient reports significant improvement with therapy and home program.    Remaining deficits: Should continue with independent HEP   Education / Equipment: HEP Plan:    Patient agrees to discharge.  Patient goals were not met. Patient is being discharged due to being pleased with the current functional level.  ?????    Jeidy Hoerner P. Ohm Dentler PT, MPH 12/01/16 1:03 PM       Sheza Strickland P. Helene Kelp PT, MPH 12/01/16 1:03 PM

## 2016-11-02 ENCOUNTER — Encounter: Payer: Medicare Other | Admitting: Rehabilitative and Restorative Service Providers"

## 2016-12-07 ENCOUNTER — Encounter: Payer: Self-pay | Admitting: Osteopathic Medicine

## 2016-12-07 ENCOUNTER — Ambulatory Visit (INDEPENDENT_AMBULATORY_CARE_PROVIDER_SITE_OTHER): Payer: Medicare Other | Admitting: Osteopathic Medicine

## 2016-12-07 VITALS — BP 114/53 | HR 46 | Ht 75.0 in | Wt 230.0 lb

## 2016-12-07 DIAGNOSIS — Z794 Long term (current) use of insulin: Secondary | ICD-10-CM

## 2016-12-07 DIAGNOSIS — H409 Unspecified glaucoma: Secondary | ICD-10-CM

## 2016-12-07 DIAGNOSIS — E1169 Type 2 diabetes mellitus with other specified complication: Secondary | ICD-10-CM

## 2016-12-07 DIAGNOSIS — I10 Essential (primary) hypertension: Secondary | ICD-10-CM | POA: Diagnosis not present

## 2016-12-07 LAB — POCT UA - MICROALBUMIN
Creatinine, POC: 300 mg/dL
MICROALBUMIN (UR) POC: 30 mg/L

## 2016-12-07 LAB — POCT GLYCOSYLATED HEMOGLOBIN (HGB A1C): Hemoglobin A1C: 6.5

## 2016-12-07 MED ORDER — FLUCONAZOLE 150 MG PO TABS: 150.0000 mg | ORAL_TABLET | Freq: Every day | ORAL | 0 refills | Status: DC

## 2016-12-07 MED ORDER — LANCETS MISC: 99 refills | Status: AC

## 2016-12-07 NOTE — Progress Notes (Signed)
HPI: Calvin Wise is a 71 y.o. male  who presents to [place] today, 12/07/16,  for chief complaint of:  Chief Complaint  Patient presents with  . Follow-up    DIABETES    Diabetes follow-up: Dx this year with insulin-dependent type 2 diabetes, hyperosmolar nonketotic state initially. Patient has been doing well with insulin injections. Was having problems with hypoglycemic episodes so insulin was changed to 20 units daily with instructions to take the Metformin 1000 twice per day. He is taking the metformin once per day. He has not had any additional hypoglycemic episodes since crease of insulin.  Essential hypertension: Blood pressure a bit on the low side today, patient states that the [place] started it some kind of what he thinks is a blood pressure medication for him. He cannot recall the name. No records on file. She said they gave him something which would also help with the diabetes, I asked if this was lisinopril and he said he thinks so. He thinks it is a 5 mg tablet. No dizziness/weakness, no headache or vision change.  Glaucoma: No recent vision changes. Is following with ophthalmology but they are telling him that he needs surgery and he would rather not have this done. He request referral to a doctor in [facility] for further discussion    Past medical, surgical, social and family history reviewed: Patient Active Problem List   Diagnosis Date Noted  . Lipoma of neck 10/12/2016  . Cervical strain 10/12/2016  . Ingrowing toenail 08/14/2016  . Open wound of great toe, left, initial encounter 06/03/2016  . Prostate cancer ([place]) 06/11/2014  . Colon polyp 10/30/2013  . TMJ syndrome 02/11/2013  . Uncontrolled type 2 diabetes mellitus with hyperosmolarity without coma ([place]) 07/04/2012  . Glaucoma 05/02/2012  . Irregular heartbeat (Declines Workup 09/2011 [facility]) 05/02/2012  . Erectile dysfunction 05/02/2012  . Essential hypertension  04/17/2012  . Left rotator cuff tear 04/17/2012  . Sciatica 04/17/2012   Past Surgical History:  Procedure Laterality Date  . BACK SURGERY  1997  . HAND SURGERY Bilateral 20 YEARS AGO  . HERNIA REPAIR    . PROSTATE BIOPSY    . RADIOACTIVE SEED IMPLANT N/A 09/19/2014   Procedure: RADIOACTIVE SEED IMPLANT;  Surgeon: Ardis Hughs, MD;  Location: [facility];  Service: Urology;  Laterality: N/A;  . REFRACTIVE SURGERY    . SHOULDER ARTHROSCOPY Right 2007   x2   Social History  Substance Use Topics  . Smoking status: Former Smoker    Packs/day: 0.25    Years: 44.00    Types: Cigarettes    Quit date: 05/24/2007  . Smokeless tobacco: Never Used  . Alcohol use No   Family History  Problem Relation Age of Onset  . Alcoholism Father   . Cancer Cousin   . Cancer Cousin   . Cancer Cousin      Current medication list and allergy/intolerance information reviewed:   Current Outpatient Prescriptions on File Prior to Visit  Medication Sig Dispense Refill  . Brinzolamide-Brimonidine 1-0.2 % SUSP Apply 1 drop to eye 3 (three) times daily.    . cetirizine (ZYRTEC) 10 MG tablet Take 10 mg by mouth daily.    . cyclobenzaprine (FLEXERIL) 10 MG tablet Take 0.5-1 tablets (5-10 mg total) by mouth 3 (three) times daily as needed for muscle spasms. 30 tablet 0  . dorzolamide-timolol (COSOPT) 22.3-6.8 MG/ML ophthalmic solution 1 drop.    Marland Kitchen gabapentin (NEURONTIN) 300  MG capsule Take 1 capsule (300 mg total) by mouth 3 (three) times daily as needed (back pain). 90 capsule 0  . glucose blood test strip Use as instructed 100 each 99  . ketotifen (ZADITOR) 0.025 % ophthalmic solution Place 1 drop into both eyes 2 (two) times daily as needed (itchy eyes).    Marland Kitchen latanoprost (XALATAN) 0.005 % ophthalmic solution 1 drop at bedtime.    . metFORMIN (GLUCOPHAGE) 1000 MG tablet Take 1 tablet (1,000 mg total) by mouth 2 (two) times daily with a meal. 270 tablet 3  . montelukast (SINGULAIR) 10 MG  tablet Take 1 tablet (10 mg total) by mouth at bedtime. For sinus pain. 30 tablet 3  . naproxen (NAPROSYN) 500 MG tablet Take 1 tablet (500 mg total) by mouth 2 (two) times daily with a meal. 30 tablet 0  . sildenafil (REVATIO) 20 MG tablet Take 1-3 tablets (20-60 mg total) by mouth daily as needed (sex). 50 tablet 2  . timolol (TIMOPTIC) 0.5 % ophthalmic solution Place 1 drop into both eyes daily.     No current facility-administered medications on file prior to visit.    Allergies  Allergen Reactions  . Aspirin     Gi bleeding  . Clindamycin/Lincomycin     rash      Review of Systems:  Constitutional: No recent illness  HEENT: No  headache, no vision change  Cardiac: No  chest pain, No  pressure  Skin: No  Rash, wound healed   Exam:  BP (!) 114/53   Pulse (!) 46   Ht 6\' 3"  (1.905 m)   Wt 230 lb (104.3 kg)   BMI 28.75 kg/m   Constitutional: VS see above. General Appearance: alert, well-developed, well-nourished, NAD  Eyes: Normal lids and conjunctive, non-icteric sclera  Ears, Nose, Mouth, Throat: MMM, Normal external inspection ears/nares/mouth/lips/gums.  Neck: No masses, trachea midline.   Respiratory: Normal respiratory effort. no wheeze, no rhonchi, no rales  Cardiovascular: S1/S2 normal, no murmur, no rub/gallop auscultated. RRR.   Musculoskeletal: Gait normal. Symmetric and independent movement of all extremities  Neurological: Normal balance/coordination. No tremor.  Skin: warm, dry, intact.   Psychiatric: Normal judgment/insight. Normal mood and affect. Oriented x3.    Results for orders placed or performed in visit on 12/07/16 (from the past 24 hour(s))  POCT HgB A1C     Status: None   Collection Time: 12/07/16  8:22 AM  Result Value Ref Range   Hemoglobin A1C 6.5   POCT UA - Microalbumin     Status: None   Collection Time: 12/07/16  8:40 AM  Result Value Ref Range   Microalbumin Ur, POC 30 mg/L   Creatinine, POC 300 mg/dL    Albumin/Creatinine Ratio, Urine, POC <30      ASSESSMENT/PLAN:   Type 2 diabetes mellitus with other specified complication, with long-term current use of insulin ([place]) - Plan: POCT HgB A1C, POCT UA - Microalbumin, HM Diabetes Foot Exam  Glaucoma, unspecified glaucoma type, unspecified laterality - Requests referral, states eye doctor was telling him he needs surgery but he would like second opinion - Plan: Ambulatory referral to Ophthalmology  Essential hypertension - Patient needs to bring all pill bottles next appointment.    Patient Instructions  For Diabetes: Continue metformin 1000 mg once per day. Let's continue the dose of insulin at 20 units per day. If you are having episodes of your blood sugar dropping, please call the office! Lets plan to follow-up here in the office to  recheck the A1c levels in 3 months. Decrease the Lisinopril to 1/2 tablet per day and bring that pill bottle with you to your next visit.   Regarding your care at the [place]: Please make sure that they are sending [place] records! Especially if they are doing additional testing or changing your medications, we need to know about this. Sometimes, [facility] patients will have a PCP within the [place] system as well as a PCP outside, this can create communication problems and medication errors which can compromise patient's safety, so we encourage you to choose one person who is going to be your primary care provider for chronic medical issues. If we do not get records from the [place], you will need to get lab work repeated with Dr. [name] at your next visit.     Visit summary with medication list and pertinent instructions was printed for patient to review. All questions at time of visit were answered - patient instructed to contact office with any additional concerns. ER/RTC precautions were reviewed with the patient. Follow-up plan: Return in about 3 months (around 03/09/2017) for RECHECK DIABETES.

## 2016-12-07 NOTE — Patient Instructions (Addendum)
For Diabetes: Continue metformin 1000 mg once per day. Let's continue the dose of insulin at 20 units per day. If you are having episodes of your blood sugar dropping, please call the office! Lets plan to follow-up here in the office to recheck the A1c levels in 3 months. Decrease the Lisinopril to 1/2 tablet per day and bring that pill bottle with you to your next visit.   Regarding your care at the [place]: Please make sure that they are sending [place] records! Especially if they are doing additional testing or changing your medications, we need to know about this. Sometimes, [facility] patients will have a PCP within the [place] system as well as a PCP outside, this can create communication problems and medication errors which can compromise patient's safety, so we encourage you to choose one person who is going to be your primary care provider for chronic medical issues. If we do not get records from the [place], you will need to get lab work repeated with Dr. [name] at your next visit.

## 2017-02-22 DIAGNOSIS — H401133 Primary open-angle glaucoma, bilateral, severe stage: Secondary | ICD-10-CM | POA: Diagnosis not present

## 2017-03-01 ENCOUNTER — Ambulatory Visit: Payer: Medicare Other | Admitting: Osteopathic Medicine

## 2017-03-08 ENCOUNTER — Ambulatory Visit: Payer: Medicare Other | Admitting: Osteopathic Medicine

## 2017-03-29 DIAGNOSIS — Z886 Allergy status to analgesic agent status: Secondary | ICD-10-CM | POA: Diagnosis not present

## 2017-03-29 DIAGNOSIS — H401133 Primary open-angle glaucoma, bilateral, severe stage: Secondary | ICD-10-CM | POA: Diagnosis not present

## 2017-03-29 DIAGNOSIS — H47293 Other optic atrophy, bilateral: Secondary | ICD-10-CM | POA: Diagnosis not present

## 2017-03-29 DIAGNOSIS — Z7984 Long term (current) use of oral hypoglycemic drugs: Secondary | ICD-10-CM | POA: Diagnosis not present

## 2017-03-29 DIAGNOSIS — Z87891 Personal history of nicotine dependence: Secondary | ICD-10-CM | POA: Diagnosis not present

## 2017-03-29 DIAGNOSIS — Z79899 Other long term (current) drug therapy: Secondary | ICD-10-CM | POA: Diagnosis not present

## 2017-03-29 DIAGNOSIS — E1136 Type 2 diabetes mellitus with diabetic cataract: Secondary | ICD-10-CM | POA: Diagnosis not present

## 2017-03-29 DIAGNOSIS — Z881 Allergy status to other antibiotic agents status: Secondary | ICD-10-CM | POA: Diagnosis not present

## 2017-04-05 ENCOUNTER — Encounter: Payer: Self-pay | Admitting: Osteopathic Medicine

## 2017-04-05 ENCOUNTER — Ambulatory Visit (INDEPENDENT_AMBULATORY_CARE_PROVIDER_SITE_OTHER): Payer: Medicare Other | Admitting: Osteopathic Medicine

## 2017-04-05 VITALS — BP 157/78 | HR 41 | Ht 75.0 in | Wt 234.0 lb

## 2017-04-05 DIAGNOSIS — E1169 Type 2 diabetes mellitus with other specified complication: Secondary | ICD-10-CM

## 2017-04-05 DIAGNOSIS — I10 Essential (primary) hypertension: Secondary | ICD-10-CM | POA: Diagnosis not present

## 2017-04-05 DIAGNOSIS — Z113 Encounter for screening for infections with a predominantly sexual mode of transmission: Secondary | ICD-10-CM

## 2017-04-05 DIAGNOSIS — E559 Vitamin D deficiency, unspecified: Secondary | ICD-10-CM | POA: Diagnosis not present

## 2017-04-05 DIAGNOSIS — E1165 Type 2 diabetes mellitus with hyperglycemia: Secondary | ICD-10-CM | POA: Diagnosis not present

## 2017-04-05 DIAGNOSIS — Z23 Encounter for immunization: Secondary | ICD-10-CM | POA: Diagnosis not present

## 2017-04-05 DIAGNOSIS — R7989 Other specified abnormal findings of blood chemistry: Secondary | ICD-10-CM | POA: Diagnosis not present

## 2017-04-05 DIAGNOSIS — R350 Frequency of micturition: Secondary | ICD-10-CM | POA: Diagnosis not present

## 2017-04-05 DIAGNOSIS — Z114 Encounter for screening for human immunodeficiency virus [HIV]: Secondary | ICD-10-CM | POA: Diagnosis not present

## 2017-04-05 DIAGNOSIS — Z1159 Encounter for screening for other viral diseases: Secondary | ICD-10-CM

## 2017-04-05 DIAGNOSIS — Z202 Contact with and (suspected) exposure to infections with a predominantly sexual mode of transmission: Secondary | ICD-10-CM

## 2017-04-05 DIAGNOSIS — Z794 Long term (current) use of insulin: Secondary | ICD-10-CM | POA: Diagnosis not present

## 2017-04-05 LAB — POCT GLYCOSYLATED HEMOGLOBIN (HGB A1C): HEMOGLOBIN A1C: 6.2

## 2017-04-05 MED ORDER — LISINOPRIL 5 MG PO TABS: 5.0000 mg | ORAL_TABLET | Freq: Every day | ORAL | 1 refills | Status: AC

## 2017-04-05 MED ORDER — METFORMIN HCL 1000 MG PO TABS: 1000.0000 mg | ORAL_TABLET | Freq: Every day | ORAL | Status: DC

## 2017-04-05 MED ORDER — METRONIDAZOLE 500 MG PO TABS: 500.0000 mg | ORAL_TABLET | Freq: Two times a day (BID) | ORAL | 0 refills | Status: AC

## 2017-04-05 NOTE — Addendum Note (Signed)
Addended by: Doree Albee on: 04/05/2017 04:47 PM   Modules accepted: Orders

## 2017-04-05 NOTE — Addendum Note (Signed)
Addended by: Maryla Morrow on: 04/05/2017 01:01 PM   Modules accepted: Orders

## 2017-04-05 NOTE — Progress Notes (Addendum)
HPI: Calvin Wise is a 71 y.o. male  who presents to [place] today, 04/05/17,  for chief complaint of:  Chief Complaint  Patient presents with  . Follow-up    diabetes    Diabetes follow-up: Dx this year with insulin-dependent type 2 diabetes, hyperosmolar nonketotic state initially. Patient has been doing well with insulin injections. He is taking the metformin once per day and doing well on this. He has not had any additional hypoglycemic episodes since decrease of insulin to 15 units daily.   Essential hypertension: Blood pressure a bit on the low side at previous visit, patient states that the [place] started it some kind of what he thinks is a blood pressure medication for him. He cannot recall the name. No records on file. He said they gave him something which would also help with the diabetes, I asked if this was lisinopril and he said he thinks so. He thinks it is a 5 mg tablet. No dizziness/weakness, no headache or vision change. His BP is up today, he has refill of the lisinopril at home  Would like testing for Trichomonas and other STDs today. No known exposure but suspecting partner of infidelity.    Past medical, surgical, social and family history reviewed: Patient Active Problem List   Diagnosis Date Noted  . Lipoma of neck 10/12/2016  . Cervical strain 10/12/2016  . Ingrowing toenail 08/14/2016  . Open wound of great toe, left, initial encounter 06/03/2016  . Prostate cancer ([place]) 06/11/2014  . Colon polyp 10/30/2013  . TMJ syndrome 02/11/2013  . Uncontrolled type 2 diabetes mellitus with hyperosmolarity without coma ([place]) 07/04/2012  . Glaucoma 05/02/2012  . Irregular heartbeat (Declines Workup 09/2011 [facility]) 05/02/2012  . Erectile dysfunction 05/02/2012  . Essential hypertension 04/17/2012  . Left rotator cuff tear 04/17/2012  . Sciatica 04/17/2012   Past Surgical History:  Procedure Laterality Date  . BACK SURGERY   1997  . HAND SURGERY Bilateral 20 YEARS AGO  . HERNIA REPAIR    . PROSTATE BIOPSY    . RADIOACTIVE SEED IMPLANT N/A 09/19/2014   Procedure: RADIOACTIVE SEED IMPLANT;  Surgeon: Ardis Hughs, MD;  Location: [facility];  Service: Urology;  Laterality: N/A;  . REFRACTIVE SURGERY    . SHOULDER ARTHROSCOPY Right 2007   x2   Social History  Substance Use Topics  . Smoking status: Former Smoker    Packs/day: 0.25    Years: 44.00    Types: Cigarettes    Quit date: 05/24/2007  . Smokeless tobacco: Never Used  . Alcohol use No   Family History  Problem Relation Age of Onset  . Alcoholism Father   . Cancer Cousin   . Cancer Cousin   . Cancer Cousin      Current medication list and allergy/intolerance information reviewed:   Current Outpatient Prescriptions on File Prior to Visit  Medication Sig Dispense Refill  . Brinzolamide-Brimonidine 1-0.2 % SUSP Apply 1 drop to eye 3 (three) times daily.    . cetirizine (ZYRTEC) 10 MG tablet Take 10 mg by mouth daily.    . cyclobenzaprine (FLEXERIL) 10 MG tablet Take 0.5-1 tablets (5-10 mg total) by mouth 3 (three) times daily as needed for muscle spasms. 30 tablet 0  . dorzolamide-timolol (COSOPT) 22.3-6.8 MG/ML ophthalmic solution 1 drop.    . fluconazole (DIFLUCAN) 150 MG tablet Take 1 tablet (150 mg total) by mouth daily. 5 tablet 0  . gabapentin (NEURONTIN) 300 MG  capsule Take 1 capsule (300 mg total) by mouth 3 (three) times daily as needed (back pain). 90 capsule 0  . glucose blood test strip Use as instructed 100 each 99  . ketotifen (ZADITOR) 0.025 % ophthalmic solution Place 1 drop into both eyes 2 (two) times daily as needed (itchy eyes).    . Lancets MISC For use with glucometer qid as directed 100 each 99  . latanoprost (XALATAN) 0.005 % ophthalmic solution 1 drop at bedtime.    . metFORMIN (GLUCOPHAGE) 1000 MG tablet Take 1 tablet (1,000 mg total) by mouth 2 (two) times daily with a meal. 270 tablet 3  .  montelukast (SINGULAIR) 10 MG tablet Take 1 tablet (10 mg total) by mouth at bedtime. For sinus pain. 30 tablet 3  . naproxen (NAPROSYN) 500 MG tablet Take 1 tablet (500 mg total) by mouth 2 (two) times daily with a meal. 30 tablet 0  . sildenafil (REVATIO) 20 MG tablet Take 1-3 tablets (20-60 mg total) by mouth daily as needed (sex). 50 tablet 2  . timolol (TIMOPTIC) 0.5 % ophthalmic solution Place 1 drop into both eyes daily.     No current facility-administered medications on file prior to visit.    Allergies  Allergen Reactions  . Aspirin     Gi bleeding  . Clindamycin/Lincomycin     rash      Review of Systems:  Constitutional: No recent illness  HEENT: No  headache, no vision change  Cardiac: No  chest pain, No  pressure  Skin: No  Rash, wound healed   Exam:  BP (!) 157/78   Pulse (!) 41   Ht 6\' 3"  (1.905 m)   Wt 234 lb (106.1 kg)   BMI 29.25 kg/m  BP actually higher on second check!   Constitutional: VS see above. General Appearance: alert, well-developed, well-nourished, NAD  Eyes: Normal lids and conjunctive, non-icteric sclera  Ears, Nose, Mouth, Throat: MMM, Normal external inspection ears/nares/mouth/lips/gums.  Neck: No masses, trachea midline.   Respiratory: Normal respiratory effort. no wheeze, no rhonchi, no rales  Cardiovascular: S1/S2 normal, no murmur, no rub/gallop auscultated. RRR.   Musculoskeletal: Gait normal. Symmetric and independent movement of all extremities  Neurological: Normal balance/coordination. No tremor.  Skin: warm, dry, intact.   Psychiatric: Normal judgment/insight. Normal mood and affect. Oriented x3.    Results for orders placed or performed in visit on 04/05/17 (from the past 24 hour(s))  POCT HgB A1C     Status: None   Collection Time: 04/05/17  8:24 AM  Result Value Ref Range   Hemoglobin A1C 6.2      ASSESSMENT/PLAN:   Type 2 diabetes mellitus with other specified complication, with long-term current use  of insulin ([facility]) - Plan: POCT HgB A1C, insulin glargine (LANTUS) 100 UNIT/ML injection, metFORMIN (GLUCOPHAGE) 1000 MG tablet, lisinopril (PRINIVIL,ZESTRIL) 5 MG tablet, CBC, COMPLETE METABOLIC PANEL WITH GFR, Lipid panel, TSH, VITAMIN D 25 Hydroxy (Vit-D Deficiency, Fractures)  Need for immunization against influenza - Plan: Flu vaccine HIGH DOSE PF  Essential hypertension - Plan: lisinopril (PRINIVIL,ZESTRIL) 5 MG tablet, CBC, COMPLETE METABOLIC PANEL WITH GFR, Lipid panel, TSH  Need for hepatitis C screening test - Plan: Hepatitis C antibody  Encounter for screening for HIV - Plan: HIV antibody  Routine screening for STI (sexually transmitted infection) - Plan: Trichomonas vaginalis, RNA, C. trachomatis/N. gonorrhoeae RNA  Possible exposure to STD - recent breakup, she was tested but isn't forthcoming about any positive results, he'd like treatment for [employer]  since testing involved swab    Patient Instructions  Plan:  Continue current insulin and metformin  Restart the Lisinopril and a nurse can recheck blood pressure in 2 weeks - please schedule a nurse visit   Labs are due - blood work  STD testing as well  We should have all results back within a week - let us know if you don't hear from [place] about results!     Visit summary with medication list and pertinent instructions was printed for patient to review. All questions at time of visit were answered - patient instructed to contact office with any additional concerns. ER/RTC precautions were reviewed with the patient. Follow-up plan: Return in about 3 months (around 07/05/2017) for DM2 recheck, & 2 week nurse visit blood pressure check back on lisinopril .

## 2017-04-05 NOTE — Patient Instructions (Signed)
Plan:  Continue current insulin and metformin  Restart the Lisinopril and a nurse can recheck blood pressure in 2 weeks - please schedule a nurse visit   Labs are due - blood work  STD testing as well  We should have all results back within a week - let us know if you don't hear from [place] about results!

## 2017-04-06 LAB — CBC
HEMATOCRIT: 39.2 % (ref 38.5–50.0)
HEMOGLOBIN: 13 g/dL — AB (ref 13.2–17.1)
MCH: 28.4 pg (ref 27.0–33.0)
MCHC: 33.2 g/dL (ref 32.0–36.0)
MCV: 85.8 fL (ref 80.0–100.0)
MPV: 10.9 fL (ref 7.5–12.5)
Platelets: 139 10*3/uL — ABNORMAL LOW (ref 140–400)
RBC: 4.57 10*6/uL (ref 4.20–5.80)
RDW: 12.8 % (ref 11.0–15.0)
WBC: 4.8 10*3/uL (ref 3.8–10.8)

## 2017-04-06 LAB — COMPLETE METABOLIC PANEL WITH GFR
AG RATIO: 1.5 (calc) (ref 1.0–2.5)
ALT: 19 U/L (ref 9–46)
AST: 25 U/L (ref 10–35)
Albumin: 4.2 g/dL (ref 3.6–5.1)
Alkaline phosphatase (APISO): 53 U/L (ref 40–115)
BUN: 13 mg/dL (ref 7–25)
CO2: 25 mmol/L (ref 20–32)
CREATININE: 1.15 mg/dL (ref 0.70–1.18)
Calcium: 9.4 mg/dL (ref 8.6–10.3)
Chloride: 105 mmol/L (ref 98–110)
GFR, EST AFRICAN AMERICAN: 74 mL/min/{1.73_m2} (ref >=60)
GFR, EST NON AFRICAN AMERICAN: 64 mL/min/{1.73_m2} (ref >=60)
GLOBULIN: 2.8 g/dL (ref 1.9–3.7)
Glucose, Bld: 125 mg/dL — ABNORMAL HIGH (ref 65–99)
POTASSIUM: 4.4 mmol/L (ref 3.5–5.3)
SODIUM: 137 mmol/L (ref 135–146)
TOTAL PROTEIN: 7 g/dL (ref 6.1–8.1)
Total Bilirubin: 0.5 mg/dL (ref 0.2–1.2)

## 2017-04-06 LAB — HEPATITIS C ANTIBODY
HEP C AB: NONREACTIVE
SIGNAL TO CUT-OFF: 0.02 (ref <1.00)

## 2017-04-06 LAB — TSH: TSH: 2.34 m[IU]/L (ref 0.40–4.50)

## 2017-04-06 LAB — LIPID PANEL
CHOL/HDL RATIO: 3.2 (calc) (ref <5.0)
Cholesterol: 104 mg/dL (ref <200)
HDL: 33 mg/dL — ABNORMAL LOW (ref >40)
LDL Cholesterol (Calc): 53 mg/dL (calc)
NON-HDL CHOLESTEROL (CALC): 71 mg/dL (ref <130)
Triglycerides: 94 mg/dL (ref <150)

## 2017-04-06 LAB — HIV ANTIBODY (ROUTINE TESTING W REFLEX): HIV 1&2 Ab, 4th Generation: NONREACTIVE

## 2017-04-06 LAB — VITAMIN D 25 HYDROXY (VIT D DEFICIENCY, FRACTURES): Vit D, 25-Hydroxy: 24 ng/mL — ABNORMAL LOW (ref 30–100)

## 2017-04-07 LAB — C. TRACHOMATIS/N. GONORRHOEAE RNA
C. TRACHOMATIS RNA, TMA: NOT DETECTED
N. gonorrhoeae RNA, TMA: NOT DETECTED

## 2017-04-19 ENCOUNTER — Ambulatory Visit (INDEPENDENT_AMBULATORY_CARE_PROVIDER_SITE_OTHER): Payer: Medicare Other | Admitting: Osteopathic Medicine

## 2017-04-19 VITALS — BP 126/54 | HR 52

## 2017-04-19 DIAGNOSIS — H2513 Age-related nuclear cataract, bilateral: Secondary | ICD-10-CM | POA: Insufficient documentation

## 2017-04-19 DIAGNOSIS — I10 Essential (primary) hypertension: Secondary | ICD-10-CM | POA: Diagnosis not present

## 2017-04-19 DIAGNOSIS — Z7984 Long term (current) use of oral hypoglycemic drugs: Secondary | ICD-10-CM | POA: Diagnosis not present

## 2017-04-19 DIAGNOSIS — H35039 Hypertensive retinopathy, unspecified eye: Secondary | ICD-10-CM | POA: Diagnosis not present

## 2017-04-19 DIAGNOSIS — H02836 Dermatochalasis of left eye, unspecified eyelid: Secondary | ICD-10-CM | POA: Diagnosis not present

## 2017-04-19 DIAGNOSIS — Z881 Allergy status to other antibiotic agents status: Secondary | ICD-10-CM | POA: Diagnosis not present

## 2017-04-19 DIAGNOSIS — H35033 Hypertensive retinopathy, bilateral: Secondary | ICD-10-CM | POA: Diagnosis not present

## 2017-04-19 DIAGNOSIS — H47293 Other optic atrophy, bilateral: Secondary | ICD-10-CM | POA: Diagnosis not present

## 2017-04-19 DIAGNOSIS — H02833 Dermatochalasis of right eye, unspecified eyelid: Secondary | ICD-10-CM | POA: Diagnosis not present

## 2017-04-19 DIAGNOSIS — E1136 Type 2 diabetes mellitus with diabetic cataract: Secondary | ICD-10-CM | POA: Diagnosis not present

## 2017-04-19 DIAGNOSIS — Z79899 Other long term (current) drug therapy: Secondary | ICD-10-CM | POA: Diagnosis not present

## 2017-04-19 DIAGNOSIS — H401133 Primary open-angle glaucoma, bilateral, severe stage: Secondary | ICD-10-CM | POA: Diagnosis not present

## 2017-04-19 DIAGNOSIS — Z886 Allergy status to analgesic agent status: Secondary | ICD-10-CM | POA: Diagnosis not present

## 2017-04-19 DIAGNOSIS — Z87891 Personal history of nicotine dependence: Secondary | ICD-10-CM | POA: Diagnosis not present

## 2017-04-19 NOTE — Progress Notes (Signed)
Pt came into clinic today for BP check. At last OV, he was restarted on lisinopril. Pt reports he is tolerating this medication well, no side effects. Reports he was on it in the past and it caused his BP to get too low. Pt's BP was at goal today, will route to PCP for review. Advised we would contact him with any changes, verbalized understanding.   Pt did get DM eye exam completed this morning at [facility]. Records requested.

## 2017-04-19 NOTE — Progress Notes (Signed)
BP (!) 126/54   Pulse (!) 52   BP seems quite sensitive to meds! I'd have him try half dose Lisinopril and will recheck in another 2 weeks.

## 2017-04-20 NOTE — Progress Notes (Signed)
Pt advised, follow up scheduled.

## 2017-05-03 ENCOUNTER — Encounter: Payer: Self-pay | Admitting: Family Medicine

## 2017-05-03 ENCOUNTER — Ambulatory Visit (INDEPENDENT_AMBULATORY_CARE_PROVIDER_SITE_OTHER): Payer: Medicare Other | Admitting: Family Medicine

## 2017-05-03 VITALS — BP 146/60 | HR 50 | Ht 75.0 in | Wt 236.5 lb

## 2017-05-03 DIAGNOSIS — I10 Essential (primary) hypertension: Secondary | ICD-10-CM | POA: Diagnosis not present

## 2017-05-03 NOTE — Patient Instructions (Addendum)
Thank you for coming in today. Increase the lisinopril back up to 1 pill daily.  If you feel light headed or dizzy cut back to 1/2 pill.  Recheck with Dr [name] as scheduled on Dec 12.  Recheck earlier as needed.

## 2017-05-03 NOTE — Progress Notes (Signed)
Calvin Wise is a 71 y.o. male who presents to [facility] today for HTN.   Calvin Wise is typically seen by my partner Dr [name]. She is away currently and Calvin Wise is here to follow up HTN. He has had difficult to control hypertension recently. His blood pressure was in the 546E systolic on 5 mg of lisinopril. He was asked to cut back to 2.5 mg of lisinopril and returns today for follow-up. He notes no chest pain palpitations lightheadedness or dizziness on either of the lisinopril doses. He feels well otherwise. He has a follow-up appointment with Dr. [name] in the near future.   Past Medical History:  Diagnosis Date  . Diabetes mellitus without complication ([place])    Type II  . Erectile dysfunction 05/02/2012  . Glaucoma 05/02/2012  . History of GI bleed 2013   Related to ASA use  . Irregular heartbeat (Declines Workup 09/2011 [facility]) 05/02/2012  . Left rotator cuff tear 04/17/2012  . Prostate cancer [facility])    prostate   Past Surgical History:  Procedure Laterality Date  . BACK SURGERY  1997  . HAND SURGERY Bilateral 20 YEARS AGO  . HERNIA REPAIR    . PROSTATE BIOPSY    . RADIOACTIVE SEED IMPLANT N/A 09/19/2014   Procedure: RADIOACTIVE SEED IMPLANT;  Surgeon: Ardis Hughs, MD;  Location: [facility];  Service: Urology;  Laterality: N/A;  . REFRACTIVE SURGERY    . SHOULDER ARTHROSCOPY Right 2007   x2   Social History  Substance Use Topics  . Smoking status: Former Smoker    Packs/day: 0.25    Years: 44.00    Types: Cigarettes    Quit date: 05/24/2007  . Smokeless tobacco: Never Used  . Alcohol use No   family history includes Alcoholism in his father; Cancer in his cousin, cousin, and cousin.  ROS as above:  Medications: Current Outpatient Prescriptions  Medication Sig Dispense Refill  . Brinzolamide-Brimonidine 1-0.2 % SUSP  Apply 1 drop to eye 3 (three) times daily.    . cetirizine (ZYRTEC) 10 MG tablet Take 10 mg by mouth daily.    . dorzolamide-timolol (COSOPT) 22.3-6.8 MG/ML ophthalmic solution 1 drop.    Marland Kitchen glucose blood test strip Use as instructed 100 each 99  . insulin glargine (LANTUS) 100 UNIT/ML injection Inject 15 Units into the skin at bedtime.    Marland Kitchen ketotifen (ZADITOR) 0.025 % ophthalmic solution Place 1 drop into both eyes 2 (two) times daily as needed (itchy eyes).    . Lancets MISC For use with glucometer qid as directed 100 each 99  . latanoprost (XALATAN) 0.005 % ophthalmic solution 1 drop at bedtime.    Marland Kitchen lisinopril (PRINIVIL,ZESTRIL) 5 MG tablet Take 1 tablet (5 mg total) by mouth daily. (Patient taking differently: Take 2.5 mg by mouth daily. ) 30 tablet 1  . metFORMIN (GLUCOPHAGE) 1000 MG tablet Take 1 tablet (1,000 mg total) by mouth daily with breakfast.    . montelukast (SINGULAIR) 10 MG tablet Take 1 tablet (10 mg total) by mouth at bedtime. For sinus pain. 30 tablet 3  . sildenafil (REVATIO) 20 MG tablet Take 1-3 tablets (20-60 mg total) by mouth daily as needed (sex). 50 tablet 2  . timolol (TIMOPTIC) 0.5 % ophthalmic solution Place 1 drop into both eyes daily.     No current facility-administered medications for this visit.    Allergies  Allergen Reactions  . Aspirin  Gi bleeding  . Clindamycin/Lincomycin     rash    Health Maintenance Health Maintenance  Topic Date Due  . OPHTHALMOLOGY EXAM  11/23/2015  . HEMOGLOBIN A1C  10/03/2017  . FOOT EXAM  12/07/2017  . COLONOSCOPY  03/23/2025  . TETANUS/TDAP  04/14/2025  . INFLUENZA VACCINE  Completed  . Hepatitis C Screening  Completed  . PNA vac Low Risk Adult  Completed     Exam:  BP (!) 146/60   Pulse (!) 50   Ht 6\' 3"  (1.905 m)   Wt 236 lb 8 oz (107.3 kg)   SpO2 98%   BMI 29.56 kg/m  Gen: Well NAD HEENT: EOMI,  MMM Lungs: Normal work of breathing. CTABL Heart: RRR no MRG Abd: NABS, Soft. Nondistended,  Nontender Exts: Brisk capillary refill, warm and well perfused.    No results found for this or any previous visit (from the past 72 hour(s)). No results found.    Assessment and Plan: 71 y.o. male with Hypertension: Blood pressure is a bit high today on 2.5 mg of lisinopril. As he tolerated the higher dose of 5 mg of lisinopril I think it's reasonable to go back to that and follow-up with PCP in the future. Recheck or return as needed if worsening. Discussed precautions.   No orders of the defined types were placed in this encounter.  No orders of the defined types were placed in this encounter.    Discussed warning signs or symptoms. Please see discharge instructions. Patient expresses understanding.  I spent 15 minutes with this patient, greater than 50% was face-to-face time counseling regarding treatment plan.

## 2017-05-15 ENCOUNTER — Ambulatory Visit (INDEPENDENT_AMBULATORY_CARE_PROVIDER_SITE_OTHER): Payer: Medicare Other | Admitting: Osteopathic Medicine

## 2017-05-15 ENCOUNTER — Encounter: Payer: Self-pay | Admitting: Osteopathic Medicine

## 2017-05-15 VITALS — BP 136/76 | HR 54 | Wt 239.0 lb

## 2017-05-15 DIAGNOSIS — S6991XA Unspecified injury of right wrist, hand and finger(s), initial encounter: Secondary | ICD-10-CM

## 2017-05-15 DIAGNOSIS — E1169 Type 2 diabetes mellitus with other specified complication: Secondary | ICD-10-CM

## 2017-05-15 DIAGNOSIS — Z794 Long term (current) use of insulin: Secondary | ICD-10-CM | POA: Diagnosis not present

## 2017-05-15 DIAGNOSIS — E119 Type 2 diabetes mellitus without complications: Secondary | ICD-10-CM

## 2017-05-15 NOTE — Progress Notes (Addendum)
HPI: Calvin Wise is a 71 y.o. male  who presents to [place] today, 05/15/17,  for chief complaint of:  Chief Complaint  Patient presents with  . Hand Pain    right hand finger swollen     . Over the weekend working on a car, Slammed R 4th digit in hood, hand started hurting more that evening and was swollen.  . Now, he is able to move it fairly normally, there is some discomfort but no significant pain. Still a little bit swollen and has what appears to be a blister located near the lateral nail and some bruising into the nail.   Recent A1c indicates good diabetes control    Past medical history, surgical history, social history and family history reviewed.  Patient Active Problem List   Diagnosis Date Noted  . Lipoma of neck 10/12/2016  . Cervical strain 10/12/2016  . Ingrowing toenail 08/14/2016  . Open wound of great toe, left, initial encounter 06/03/2016  . Prostate cancer ([place]) 06/11/2014  . Colon polyp 10/30/2013  . TMJ syndrome 02/11/2013  . Uncontrolled type 2 diabetes mellitus with hyperosmolarity without coma ([place]) 07/04/2012  . Glaucoma 05/02/2012  . Irregular heartbeat (Declines Workup 09/2011 [facility]) 05/02/2012  . Erectile dysfunction 05/02/2012  . Essential hypertension 04/17/2012  . Left rotator cuff tear 04/17/2012  . Sciatica 04/17/2012    Current medication list and allergy/intolerance information reviewed.   Current Outpatient Prescriptions on File Prior to Visit  Medication Sig Dispense Refill  . Brinzolamide-Brimonidine 1-0.2 % SUSP Apply 1 drop to eye 3 (three) times daily.    . cetirizine (ZYRTEC) 10 MG tablet Take 10 mg by mouth daily.    . dorzolamide-timolol (COSOPT) 22.3-6.8 MG/ML ophthalmic solution 1 drop.    Marland Kitchen glucose blood test strip Use as instructed 100 each 99  . insulin glargine (LANTUS) 100 UNIT/ML injection Inject 15 Units into the skin at bedtime.    Marland Kitchen ketotifen (ZADITOR) 0.025 %  ophthalmic solution Place 1 drop into both eyes 2 (two) times daily as needed (itchy eyes).    . Lancets MISC For use with glucometer qid as directed 100 each 99  . latanoprost (XALATAN) 0.005 % ophthalmic solution 1 drop at bedtime.    Marland Kitchen lisinopril (PRINIVIL,ZESTRIL) 5 MG tablet Take 1 tablet (5 mg total) by mouth daily. (Patient taking differently: Take 2.5 mg by mouth daily. ) 30 tablet 1  . metFORMIN (GLUCOPHAGE) 1000 MG tablet Take 1 tablet (1,000 mg total) by mouth daily with breakfast.    . montelukast (SINGULAIR) 10 MG tablet Take 1 tablet (10 mg total) by mouth at bedtime. For sinus pain. 30 tablet 3  . sildenafil (REVATIO) 20 MG tablet Take 1-3 tablets (20-60 mg total) by mouth daily as needed (sex). 50 tablet 2  . timolol (TIMOPTIC) 0.5 % ophthalmic solution Place 1 drop into both eyes daily.     No current facility-administered medications on file prior to visit.    Allergies  Allergen Reactions  . Aspirin     Gi bleeding  . Clindamycin/Lincomycin     rash      Review of Systems:  Constitutional: No recent illness  Musculoskeletal: +new myalgia/arthralgia  Skin: No  Rash, +blister and bruising    Exam:  BP 136/76   Pulse (!) 54   Wt 239 lb (108.4 kg)   BMI 29.87 kg/m   Constitutional: VS see above. General Appearance: alert, well-developed, well-nourished, NAD  Eyes: Normal lids  and conjunctive, non-icteric sclera  Ears, Nose, Mouth, Throat: MMM, Normal external inspection ears/nares/mouth/lips/gums.  Neck: No masses, trachea midline.   Respiratory: Normal respiratory effort.   Musculoskeletal: Gait normal. Normal ROM all fingers on R at DIP, PIP, MCP. 4th digit mild swelling but no erythema. Blister and ecchymosis very mild, minimal tenderness   Neurological: Normal balance/coordination. No tremor.  Skin: warm, dry, intact.   Psychiatric: Normal judgment/insight. Normal mood and affect. Oriented x3.         ASSESSMENT/PLAN:   Finger injury,  right, initial encounter - splint and coban applied - immobilize except for bathing for the next 2 weeks. Patient declines x-ray today. Does not appear infected, precautions reviewed  Type 2 diabetes mellitus without complication, with long-term current use of insulin ([place])     Follow-up plan: If finger worse, come back to see has. If not, return for currently scheduled diabetes follow-up  Visit summary with medication list and pertinent instructions was printed for patient to review, alert [place] if any changes needed. All questions at time of visit were answered - patient instructed to contact office with any additional concerns. ER/RTC precautions were reviewed with the patient and understanding verbalized.

## 2017-05-15 NOTE — Addendum Note (Signed)
Addended by: Maryla Morrow on: 05/15/2017 05:21 PM   Modules accepted: Level of Service

## 2017-05-31 DIAGNOSIS — H35039 Hypertensive retinopathy, unspecified eye: Secondary | ICD-10-CM | POA: Diagnosis not present

## 2017-05-31 DIAGNOSIS — H2513 Age-related nuclear cataract, bilateral: Secondary | ICD-10-CM | POA: Diagnosis not present

## 2017-05-31 DIAGNOSIS — H401133 Primary open-angle glaucoma, bilateral, severe stage: Secondary | ICD-10-CM | POA: Diagnosis not present

## 2017-05-31 LAB — HM DIABETES EYE EXAM

## 2017-06-09 ENCOUNTER — Other Ambulatory Visit: Payer: Self-pay

## 2017-06-09 DIAGNOSIS — E1169 Type 2 diabetes mellitus with other specified complication: Secondary | ICD-10-CM

## 2017-06-09 DIAGNOSIS — Z794 Long term (current) use of insulin: Principal | ICD-10-CM

## 2017-06-09 MED ORDER — METFORMIN HCL 1000 MG PO TABS: 1000.0000 mg | ORAL_TABLET | Freq: Every day | ORAL | 3 refills | Status: DC

## 2017-06-09 NOTE — Telephone Encounter (Signed)
Patient request refill for Metformin 1000 mg, #[phone] refills sent to [facility]. Calvin Wise

## 2017-07-05 ENCOUNTER — Ambulatory Visit (INDEPENDENT_AMBULATORY_CARE_PROVIDER_SITE_OTHER): Payer: Medicare Other | Admitting: Osteopathic Medicine

## 2017-07-05 ENCOUNTER — Encounter: Payer: Self-pay | Admitting: Osteopathic Medicine

## 2017-07-05 VITALS — BP 144/49 | HR 53 | Wt 237.0 lb

## 2017-07-05 DIAGNOSIS — E1169 Type 2 diabetes mellitus with other specified complication: Secondary | ICD-10-CM

## 2017-07-05 DIAGNOSIS — L02811 Cutaneous abscess of head [any part, except face]: Secondary | ICD-10-CM

## 2017-07-05 DIAGNOSIS — Z794 Long term (current) use of insulin: Secondary | ICD-10-CM

## 2017-07-05 DIAGNOSIS — I10 Essential (primary) hypertension: Secondary | ICD-10-CM | POA: Diagnosis not present

## 2017-07-05 LAB — POCT GLYCOSYLATED HEMOGLOBIN (HGB A1C): Hemoglobin A1C: 6.7

## 2017-07-05 NOTE — Progress Notes (Signed)
HPI: Calvin Wise is a 71 y.o. male  who presents to [place] today, 07/05/17,  for chief complaint of:  Chief Complaint  Patient presents with  . Diabetes  . Hypertension     Diabetes: Dx this year with insulin-dependent type 2 diabetes, hyperosmolar nonketotic state initially. Patient has been doing well with insulin injections. He is taking the metformin once per day and doing well on this. He has not had any additional hypoglycemic episodes since decrease of insulin to 15 units daily.    Essential hypertension:  144/49 initially, usually takes BP meds at night. Blood pressure a bit on the low side at previous visit, patient states that the [place] started what he thinks is a blood pressure medication (lisinopril?) - we went ahead and restarted low dose lisinopril. No dizziness/weakness, no headache or vision change.   Skin concern: Noticed bump on his R scalp starting last week, drained a little yesterday, it feels swollen and tender. He shaves his head regularly.       Past medical, surgical, social and family history reviewed: Patient Active Problem List   Diagnosis Date Noted  . Lipoma of neck 10/12/2016  . Cervical strain 10/12/2016  . Ingrowing toenail 08/14/2016  . Open wound of great toe, left, initial encounter 06/03/2016  . Prostate cancer ([place]) 06/11/2014  . Colon polyp 10/30/2013  . TMJ syndrome 02/11/2013  . Uncontrolled type 2 diabetes mellitus with hyperosmolarity without coma ([place]) 07/04/2012  . Glaucoma 05/02/2012  . Irregular heartbeat (Declines Workup 09/2011 [facility]) 05/02/2012  . Erectile dysfunction 05/02/2012  . Essential hypertension 04/17/2012  . Left rotator cuff tear 04/17/2012  . Sciatica 04/17/2012   Past Surgical History:  Procedure Laterality Date  . BACK SURGERY  1997  . HAND SURGERY Bilateral 20 YEARS AGO  . HERNIA REPAIR    . PROSTATE BIOPSY    . RADIOACTIVE SEED IMPLANT N/A 09/19/2014   Procedure: RADIOACTIVE SEED IMPLANT;  Surgeon: Ardis Hughs, MD;  Location: [facility];  Service: Urology;  Laterality: N/A;  . REFRACTIVE SURGERY    . SHOULDER ARTHROSCOPY Right 2007   x2   Social History   Tobacco Use  . Smoking status: Former Smoker    Packs/day: 0.25    Years: 44.00    Pack years: 11.00    Types: Cigarettes    Last attempt to quit: 05/24/2007    Years since quitting: 10.1  . Smokeless tobacco: Never Used  Substance Use Topics  . Alcohol use: No   Family History  Problem Relation Age of Onset  . Alcoholism Father   . Cancer Cousin   . Cancer Cousin   . Cancer Cousin      Current medication list and allergy/intolerance information reviewed:   Current Outpatient Medications on File Prior to Visit  Medication Sig Dispense Refill  . Brinzolamide-Brimonidine 1-0.2 % SUSP Apply 1 drop to eye 3 (three) times daily.    . cetirizine (ZYRTEC) 10 MG tablet Take 10 mg by mouth daily.    . dorzolamide-timolol (COSOPT) 22.3-6.8 MG/ML ophthalmic solution 1 drop.    Marland Kitchen glucose blood test strip Use as instructed 100 each 99  . insulin glargine (LANTUS) 100 UNIT/ML injection Inject 15 Units into the skin at bedtime.    Marland Kitchen ketotifen (ZADITOR) 0.025 % ophthalmic solution Place 1 drop into both eyes 2 (two) times daily as needed (itchy eyes).    . Lancets MISC For use with glucometer qid as  directed 100 each 99  . latanoprost (XALATAN) 0.005 % ophthalmic solution 1 drop at bedtime.    Marland Kitchen lisinopril (PRINIVIL,ZESTRIL) 5 MG tablet Take 1 tablet (5 mg total) by mouth daily. (Patient taking differently: Take 2.5 mg by mouth daily. ) 30 tablet 1  . metFORMIN (GLUCOPHAGE) 1000 MG tablet Take 1 tablet (1,000 mg total) daily with breakfast by mouth. 90 tablet 3  . montelukast (SINGULAIR) 10 MG tablet Take 1 tablet (10 mg total) by mouth at bedtime. For sinus pain. 30 tablet 3  . sildenafil (REVATIO) 20 MG tablet Take 1-3 tablets (20-60 mg total) by mouth daily as  needed (sex). 50 tablet 2  . timolol (TIMOPTIC) 0.5 % ophthalmic solution Place 1 drop into both eyes daily.     No current facility-administered medications on file prior to visit.    Allergies  Allergen Reactions  . Aspirin     Gi bleeding  . Clindamycin/Lincomycin     rash      Review of Systems:  Constitutional: No recent illness  HEENT: No  headache, no vision change  Cardiac: No  chest pain, No  Pressure  Endocrine: no polyuria/polydipsia,   Neuro: no dizziness, weakness   Skin: +boil, see HPI   Exam:  BP (!) 144/49   Pulse (!) 53   Wt 237 lb (107.5 kg)   BMI 29.62 kg/m    Constitutional: VS see above. General Appearance: alert, well-developed, well-nourished, NAD  Eyes: Normal lids and conjunctive, non-icteric sclera  Ears, Nose, Mouth, Throat: MMM, Normal external inspection ears/nares/mouth/lips/gums.  Neck: No masses, trachea midline.   Respiratory: Normal respiratory effort. no wheeze, no rhonchi, no rales  Cardiovascular: S1/S2 normal, no murmur, no rub/gallop auscultated. RRR.   Musculoskeletal: Gait normal. Symmetric and independent movement of all extremities  Neurological: Normal balance/coordination. No tremor.  Skin: abscess over R ear, in area of shaved hair, (+)fluctuant, tender. Minimal erythema.   Psychiatric: Normal judgment/insight. Normal mood and affect. Oriented x3.    Results for orders placed or performed in visit on 07/05/17 (from the past 24 hour(s))  POCT HgB A1C     Status: None   Collection Time: 07/05/17  8:48 AM  Result Value Ref Range   Hemoglobin A1C 6.7     PRE-OP DIAGNOSIS: Abscess of scalp POST-OP DIAGNOSIS: Same  PROCEDURE: incision and drainage of abscess Performing Physician: [name]  PROCEDURE:   Patient informed consent obtained verbally after discussion of risks (including but not limited to pain, infection, bleeding, damage to surrounding tissues, incomplete evacuation/treatment of infection,  recurrence)  and benefits (adequate treatment and diagnosis, relief of pain). All questions answered prior to procedure.   A timeout protocol was performed prior to initiating the procedure.  The area was prepared and draped in the usual, sterile manner. The site was anesthetized with 0.5 cc 1% lidocaine with epinephrine. A linear incision along the local skin lines was made and the purulent material expressed. The abcess was explored thoroughly and sequestered pockets were evacuated Bleeding was minimal.   Packing: none   Followup: The patient tolerated the procedure well without complications. Standard post-procedure care is explained and return precautions are given, patient was provided with printed information & instructions.     ASSESSMENT/PLAN:   Type 2 diabetes mellitus with other specified complication, with long-term current use of insulin (H[facility]) - Plan: POCT HgB A1C  Essential hypertension - will rechck next week, pt reports shoulder dicsomfort today and we did I&D, BP fine last vheck when taking  meds as directed.   Abscess of scalp - I&D yielded purulent fluid, some skin deterioration centrally where he'd already tried draiing this, steri-strip placed and will follow closely - Plan: Wound culture    Visit summary with medication list and pertinent instructions was printed for patient to review. All questions at time of visit were answered - patient instructed to contact office with any additional concerns. ER/RTC precautions were reviewed with the patient.   Follow-up plan: Return for wound check next week, and 3 months for diabetes recheck - sooner if needed.

## 2017-07-08 LAB — WOUND CULTURE
MICRO NUMBER: 81397278
SPECIMEN QUALITY: ADEQUATE

## 2017-07-10 ENCOUNTER — Other Ambulatory Visit: Payer: Self-pay | Admitting: Osteopathic Medicine

## 2017-07-10 MED ORDER — SULFAMETHOXAZOLE-TRIMETHOPRIM 800-160 MG PO TABS: 1.0000 | ORAL_TABLET | Freq: Two times a day (BID) | ORAL | 0 refills | Status: DC

## 2017-07-10 NOTE — Progress Notes (Signed)
Rx abx

## 2017-07-12 ENCOUNTER — Ambulatory Visit (INDEPENDENT_AMBULATORY_CARE_PROVIDER_SITE_OTHER): Payer: Medicare Other | Admitting: Osteopathic Medicine

## 2017-07-12 ENCOUNTER — Encounter: Payer: Self-pay | Admitting: Osteopathic Medicine

## 2017-07-12 VITALS — BP 109/63 | HR 75 | Temp 98.3°F | Wt 234.1 lb

## 2017-07-12 DIAGNOSIS — A4902 Methicillin resistant Staphylococcus aureus infection, unspecified site: Secondary | ICD-10-CM | POA: Diagnosis not present

## 2017-07-12 DIAGNOSIS — L0291 Cutaneous abscess, unspecified: Secondary | ICD-10-CM | POA: Diagnosis not present

## 2017-07-12 NOTE — Patient Instructions (Signed)
We put in a few stitches so the scalp skin would come together. We need to be cautions with this, because we normally would leave a wound like this open to drain - my concern is that it won't heal properly if we do that, and I'd rather not risk a hole in your head! It may continue to drain a bit, but if it's been better on the antibiotics, I would expect that closing it now is a reasonable risk, in light of the alternative to leave it open. If there are any problems such as increased redness/swelling or pain, please come see me right away!

## 2017-07-12 NOTE — Progress Notes (Signed)
HPI: Calvin Wise is a 71 y.o. male who  has a past medical history of Diabetes mellitus without complication ([place]), Erectile dysfunction (05/02/2012), Glaucoma (05/02/2012), History of GI bleed (2013), Irregular heartbeat (Declines Workup 09/2011 [facility]) (05/02/2012), Left rotator cuff tear (04/17/2012), and Prostate cancer ([place]).  he presents to [facility] today, 07/12/17,  for chief complaint of:  Chief Complaint  Patient presents with  . Wound Check    Recent I&D abscess R scalp w/ (+)MRSA culture, he started the abx on Monday. Drainage has been much reduced since then. Still draining a bit though and swollen.      Past medical history, surgical history, social history and family history reviewed.  Patient Active Problem List   Diagnosis Date Noted  . Abscess of scalp 07/05/2017  . Lipoma of neck 10/12/2016  . Cervical strain 10/12/2016  . Ingrowing toenail 08/14/2016  . Open wound of great toe, left, initial encounter 06/03/2016  . Prostate cancer ([place]) 06/11/2014  . Colon polyp 10/30/2013  . TMJ syndrome 02/11/2013  . Uncontrolled type 2 diabetes mellitus with hyperosmolarity without coma ([place]) 07/04/2012  . Glaucoma 05/02/2012  . Irregular heartbeat (Declines Workup 09/2011 [facility]) 05/02/2012  . Erectile dysfunction 05/02/2012  . Essential hypertension 04/17/2012  . Left rotator cuff tear 04/17/2012  . Sciatica 04/17/2012    Current medication list and allergy/intolerance information reviewed.    Current Outpatient Medications on File Prior to Visit  Medication Sig Dispense Refill  . Brinzolamide-Brimonidine 1-0.2 % SUSP Apply 1 drop to eye 3 (three) times daily.    . cetirizine (ZYRTEC) 10 MG tablet Take 10 mg by mouth daily.    . dorzolamide-timolol (COSOPT) 22.3-6.8 MG/ML ophthalmic solution 1 drop.    Marland Kitchen glucose blood test strip Use as instructed 100 each 99  . insulin glargine (LANTUS) 100 UNIT/ML injection Inject 15 Units  into the skin at bedtime.    Marland Kitchen ketotifen (ZADITOR) 0.025 % ophthalmic solution Place 1 drop into both eyes 2 (two) times daily as needed (itchy eyes).    . Lancets MISC For use with glucometer qid as directed 100 each 99  . latanoprost (XALATAN) 0.005 % ophthalmic solution 1 drop at bedtime.    Marland Kitchen lisinopril (PRINIVIL,ZESTRIL) 5 MG tablet Take 1 tablet (5 mg total) by mouth daily. (Patient taking differently: Take 2.5 mg by mouth daily. ) 30 tablet 1  . metFORMIN (GLUCOPHAGE) 1000 MG tablet Take 1 tablet (1,000 mg total) daily with breakfast by mouth. 90 tablet 3  . montelukast (SINGULAIR) 10 MG tablet Take 1 tablet (10 mg total) by mouth at bedtime. For sinus pain. 30 tablet 3  . sildenafil (REVATIO) 20 MG tablet Take 1-3 tablets (20-60 mg total) by mouth daily as needed (sex). 50 tablet 2  . sulfamethoxazole-trimethoprim (BACTRIM DS,SEPTRA DS) 800-160 MG tablet Take 1 tablet by mouth 2 (two) times daily. 14 tablet 0  . timolol (TIMOPTIC) 0.5 % ophthalmic solution Place 1 drop into both eyes daily.     No current facility-administered medications on file prior to visit.    Allergies  Allergen Reactions  . Aspirin     Gi bleeding  . Clindamycin/Lincomycin     rash      Review of Systems:  HEENT: No  headache, no vision change  Cardiac: No  chest pain  Skin: No  Rash or other skin abn except abscess as above   Neurologic: No  weakness, No  Dizziness   Exam:  BP 109/63   Pulse 75   Temp 98.3 F (36.8 C) (Oral)   Wt 234 lb 1.9 oz (106.2 kg)   BMI 29.26 kg/m   Constitutional: VS see above. General Appearance: alert, well-developed, well-nourished, NAD  Respiratory: Normal respiratory effort.   Musculoskeletal: Gait normal. Symmetric and independent movement of all extremities  Neurological: Normal balance/coordination. No tremor.  Skin: warm, dry. Abscess R scalp drained some purulent fluid, minimal erythema, essentially central hole about 0.5 cm in the wound where skin  is loose and macerated and starting to heal at the edges.   Psychiatric: Normal judgment/insight. Normal mood and affect. Oriented x3.   Specimen Information: Wound     Component 7d ago  MICRO NUMBER: [id number]   SPECIMEN QUALITY: ADEQUATE   SOURCE: RIGHT SCALP   STATUS: FINAL   GRAM STAIN: No white blood cells seen No epithelial cells seen No organisms seen   ISOLATE 1: methicillin resistant Staphylococcus aureus Abnormal    Resulting Agency [facility]  Susceptibility    Methicillin resistant staphylococcus aureus    AEROBIC CULT, GRAM STAIN POSITIVE 1    CIPROFLOXACIN <=0.5  Sensitive    CLINDAMYCIN NR  Resistant    ERYTHROMYCIN >=8  Resistant    GENTAMICIN <=0.5  Sensitive    LEVOFLOXACIN <=0.12  Sensitive    OXACILLIN NR  Resistant1    TETRACYCLINE <=1  Sensitive    TRIMETH/SULFA <=10  Sensitive2    VANCOMYCIN 1  Sensitive         1 Oxacillin-resistant staphylococci are resistant to all currently available beta-lactam antimicrobial agents including penicillins, beta lactam/beta- lactamase inhibitor combinations, and cephems with staphylococcal indications, including Cefazolin.           Procedure:  Wound anesthetized w/ 1.5 cc Lido/Epi and explored for pockets/drained purulent fluid. Irrigated with saline. Scalpel and scissors used to debride the edges of centrally macerated area/hole. Simple interrupted suture x3 w/ 4.0 prolene placed fairly loosely to approximate skin edges but still allow for drainage if needed.     ASSESSMENT/PLAN: The primary encounter diagnosis was MRSA (methicillin resistant Staphylococcus aureus) infection. A diagnosis of Abscess was also pertinent to this visit.  Continue Bactrim.   I debated placing sutures versus leaving open, see patient instructions as below. Risk of open to drain I think, given how long this has been going on (initially he delayed seeking care for this, initially on abx given but when MRSA isolated we started Bactrim) my  concern for epithelialization of the abscess pocket and my concern that the skin edges would heal openly, without coming together - I thought it was appropriate at this point to debride and approximate the skin edges to close the wound, especially since drainage has been improved on abx and erythema was minimal. Will follow closely.     Patient Instructions  We put in a few stitches so the scalp skin would come together. We need to be cautions with this, because we normally would leave a wound like this open to drain - my concern is that it won't heal properly if we do that, and I'd rather not risk a hole in your head! It may continue to drain a bit, but if it's been better on the antibiotics, I would expect that closing it now is a reasonable risk, in light of the alternative to leave it open. If there are any problems such as increased redness/swelling or pain, please come see me right away!      Follow-up plan:  Return in about 2 days (around 07/14/2017) for recheck scalp .  Visit summary with medication list and pertinent instructions was printed for patient to review, alert [place] if any changes needed. All questions at time of visit were answered - patient instructed to contact office with any additional concerns. ER/RTC precautions were reviewed with the patient and understanding verbalized.   Note: Total time spent 25 minutes, greater than 50% of the visit was spent face-to-face counseling and coordinating care for the following: The primary encounter diagnosis was MRSA (methicillin resistant Staphylococcus aureus) infection. A diagnosis of Abscess was also pertinent to this visit.Marland Kitchen  Please note: voice recognition software was used to produce this document, and typos may escape review. Please contact Dr. [name] for any needed clarifications.

## 2017-07-14 ENCOUNTER — Ambulatory Visit: Payer: Medicare Other | Admitting: Osteopathic Medicine

## 2017-07-14 ENCOUNTER — Encounter: Payer: Self-pay | Admitting: Osteopathic Medicine

## 2017-07-14 VITALS — BP 128/64 | HR 57 | Temp 98.5°F

## 2017-07-14 DIAGNOSIS — L0291 Cutaneous abscess, unspecified: Secondary | ICD-10-CM

## 2017-07-14 DIAGNOSIS — A4902 Methicillin resistant Staphylococcus aureus infection, unspecified site: Secondary | ICD-10-CM

## 2017-07-14 NOTE — Patient Instructions (Signed)
Stop SSD cream Would just use neosporin or vaseline See me next week for stitches out Seek care sooner if increasing redness, pain, drainage.

## 2017-07-14 NOTE — Progress Notes (Signed)
HPI: Calvin Wise is a 71 y.o. male who  has a past medical history of Diabetes mellitus without complication ([place]), Erectile dysfunction (05/02/2012), Glaucoma (05/02/2012), History of GI bleed (2013), Irregular heartbeat (Declines Workup 09/2011 [facility]) (05/02/2012), Left rotator cuff tear (04/17/2012), and Prostate cancer ([place]).  he presents to [facility] today, 07/14/17,  for chief complaint of:  Chief Complaint  Patient presents with  . Wound Check    Recent I&D abscess R scalp w/ (+)MRSA culture, he started the abx on Monday. Drainage has been much reduced since then. 2 days ago we decided to place sutures to assist with healing since drainage had dramatically decreased and skin was quite macerated. He reports today that he has been using silvadene cream on it.      Past medical history, surgical history, social history and family history reviewed.  Patient Active Problem List   Diagnosis Date Noted  . Abscess of scalp 07/05/2017  . Lipoma of neck 10/12/2016  . Cervical strain 10/12/2016  . Ingrowing toenail 08/14/2016  . Open wound of great toe, left, initial encounter 06/03/2016  . Prostate cancer ([place]) 06/11/2014  . Colon polyp 10/30/2013  . TMJ syndrome 02/11/2013  . Uncontrolled type 2 diabetes mellitus with hyperosmolarity without coma ([place]) 07/04/2012  . Glaucoma 05/02/2012  . Irregular heartbeat (Declines Workup 09/2011 [facility]) 05/02/2012  . Erectile dysfunction 05/02/2012  . Essential hypertension 04/17/2012  . Left rotator cuff tear 04/17/2012  . Sciatica 04/17/2012    Current medication list and allergy/intolerance information reviewed.    Current Outpatient Medications on File Prior to Visit  Medication Sig Dispense Refill  . Brinzolamide-Brimonidine 1-0.2 % SUSP Apply 1 drop to eye 3 (three) times daily.    . cetirizine (ZYRTEC) 10 MG tablet Take 10 mg by mouth daily.    . dorzolamide-timolol (COSOPT) 22.3-6.8 MG/ML  ophthalmic solution 1 drop.    Marland Kitchen glucose blood test strip Use as instructed 100 each 99  . insulin glargine (LANTUS) 100 UNIT/ML injection Inject 15 Units into the skin at bedtime.    Marland Kitchen ketotifen (ZADITOR) 0.025 % ophthalmic solution Place 1 drop into both eyes 2 (two) times daily as needed (itchy eyes).    . Lancets MISC For use with glucometer qid as directed 100 each 99  . latanoprost (XALATAN) 0.005 % ophthalmic solution 1 drop at bedtime.    Marland Kitchen lisinopril (PRINIVIL,ZESTRIL) 5 MG tablet Take 1 tablet (5 mg total) by mouth daily. (Patient taking differently: Take 2.5 mg by mouth daily. ) 30 tablet 1  . metFORMIN (GLUCOPHAGE) 1000 MG tablet Take 1 tablet (1,000 mg total) daily with breakfast by mouth. 90 tablet 3  . montelukast (SINGULAIR) 10 MG tablet Take 1 tablet (10 mg total) by mouth at bedtime. For sinus pain. 30 tablet 3  . sildenafil (REVATIO) 20 MG tablet Take 1-3 tablets (20-60 mg total) by mouth daily as needed (sex). 50 tablet 2  . sulfamethoxazole-trimethoprim (BACTRIM DS,SEPTRA DS) 800-160 MG tablet Take 1 tablet by mouth 2 (two) times daily. 14 tablet 0  . timolol (TIMOPTIC) 0.5 % ophthalmic solution Place 1 drop into both eyes daily.     No current facility-administered medications on file prior to visit.    Allergies  Allergen Reactions  . Aspirin     Gi bleeding  . Clindamycin/Lincomycin     rash      Review of Systems:  HEENT: No  headache, no vision change  Cardiac: No  chest  pain  Skin: No  Rash or other skin abn except abscess as above patient reports minimal to no pain  Neurologic: No  weakness, No  Dizziness   Exam:  BP 128/64   Pulse (!) 57   Temp 98.5 F (36.9 C) (Oral)   Constitutional: VS see above. General Appearance: alert, well-developed, well-nourished, NAD  Respiratory: Normal respiratory effort.   Musculoskeletal: Gait normal. Symmetric and independent movement of all extremities  Neurological: Normal balance/coordination. No  tremor.  Skin: warm, dry. Sutures in place, no drainage or erythema.   Psychiatric: Normal judgment/insight. Normal mood and affect. Oriented x3.   Specimen Information: Wound     Component 7d ago  MICRO NUMBER: [id number]   SPECIMEN QUALITY: ADEQUATE   SOURCE: RIGHT SCALP   STATUS: FINAL   GRAM STAIN: No white blood cells seen No epithelial cells seen No organisms seen   ISOLATE 1: methicillin resistant Staphylococcus aureus Abnormal    Resulting Agency [facility]  Susceptibility    Methicillin resistant staphylococcus aureus    AEROBIC CULT, GRAM STAIN POSITIVE 1    CIPROFLOXACIN <=0.5  Sensitive    CLINDAMYCIN NR  Resistant    ERYTHROMYCIN >=8  Resistant    GENTAMICIN <=0.5  Sensitive    LEVOFLOXACIN <=0.12  Sensitive    OXACILLIN NR  Resistant1    TETRACYCLINE <=1  Sensitive    TRIMETH/SULFA <=10  Sensitive2    VANCOMYCIN 1  Sensitive         1 Oxacillin-resistant staphylococci are resistant to all currently available beta-lactam antimicrobial agents including penicillins, beta lactam/beta- lactamase inhibitor combinations, and cephems with staphylococcal indications, including Cefazolin.           Procedure:  Wound anesthetized w/ 1.5 cc Lido/Epi and explored for pockets/drained purulent fluid. Irrigated with saline. Scalpel and scissors used to debride the edges of centrally macerated area/hole. Simple interrupted suture x3 w/ 4.0 prolene placed fairly loosely to approximate skin edges but still allow for drainage if needed.     ASSESSMENT/PLAN: The primary encounter diagnosis was MRSA (methicillin resistant Staphylococcus aureus) infection. A diagnosis of Abscess was also pertinent to this visit.   Patient Instructions  Stop SSD cream Would just use neosporin or vaseline See me next week for stitches out Seek care sooner if increasing redness, pain, drainage.     Follow-up plan: Return for woud recheck and suture removal between 12/26-12/28.  Visit summary  with medication list and pertinent instructions was printed for patient to review, alert [place] if any changes needed. All questions at time of visit were answered - patient instructed to contact office with any additional concerns. ER/RTC precautions were reviewed with the patient and understanding verbalized.   Note: Total time spent 15 minutes, greater than 50% of the visit was spent face-to-face counseling and coordinating care for the following: The primary encounter diagnosis was MRSA (methicillin resistant Staphylococcus aureus) infection. A diagnosis of Abscess was also pertinent to this visit.Marland Kitchen  Please note: voice recognition software was used to produce this document, and typos may escape review. Please contact Dr. [name] for any needed clarifications.

## 2017-07-19 ENCOUNTER — Encounter: Payer: Self-pay | Admitting: Osteopathic Medicine

## 2017-07-19 ENCOUNTER — Ambulatory Visit (INDEPENDENT_AMBULATORY_CARE_PROVIDER_SITE_OTHER): Payer: Medicare Other | Admitting: Osteopathic Medicine

## 2017-07-19 VITALS — BP 128/63 | HR 55 | Temp 97.8°F | Wt 237.1 lb

## 2017-07-19 DIAGNOSIS — Z4802 Encounter for removal of sutures: Secondary | ICD-10-CM | POA: Diagnosis not present

## 2017-07-19 DIAGNOSIS — R21 Rash and other nonspecific skin eruption: Secondary | ICD-10-CM | POA: Diagnosis not present

## 2017-07-19 DIAGNOSIS — A4902 Methicillin resistant Staphylococcus aureus infection, unspecified site: Secondary | ICD-10-CM | POA: Diagnosis not present

## 2017-07-19 DIAGNOSIS — T7840XA Allergy, unspecified, initial encounter: Secondary | ICD-10-CM

## 2017-07-19 NOTE — Progress Notes (Signed)
HPI: Calvin Wise is a 71 y.o. male who  has a past medical history of Diabetes mellitus without complication ([place]), Erectile dysfunction (05/02/2012), Glaucoma (05/02/2012), History of GI bleed (2013), Irregular heartbeat (Declines Workup 09/2011 [facility]) (05/02/2012), Left rotator cuff tear (04/17/2012), and Prostate cancer ([place]).  he presents to [facility] today, 07/19/17,  for chief complaint of:  Chief Complaint  Patient presents with  . Wound Check    Recent I&D abscess 07/05/17 R scalp w/ (+)MRSA culture, he started the abx 07/10/17. Drainage has been much reduced since then. 07/12/17 we decided to debride edges and place sutures to assist with healing since drainage had dramatically decreased and skin was quite macerated and I suspected healing would be difficult by secondary intention. He let me know 07/14/17 on recheck that he had been using silvadene cream on it, I advised stop this and just keep bandaged and clean. Today 07/19/17 here for suture removal and wound recheck. He stopped Bactim w/ one pill left d/t concerns for rash.   Rashes present on arms, legs, torso. It is itchy. Benadryl helps for a few hours but then wears off. No fever, chills, lightheadedness, throat closing.     Past medical history, surgical history, social history and family history reviewed.  Patient Active Problem List   Diagnosis Date Noted  . Abscess of scalp 07/05/2017  . Lipoma of neck 10/12/2016  . Cervical strain 10/12/2016  . Ingrowing toenail 08/14/2016  . Open wound of great toe, left, initial encounter 06/03/2016  . Prostate cancer ([place]) 06/11/2014  . Colon polyp 10/30/2013  . TMJ syndrome 02/11/2013  . Uncontrolled type 2 diabetes mellitus with hyperosmolarity without coma ([place]) 07/04/2012  . Glaucoma 05/02/2012  . Irregular heartbeat (Declines Workup 09/2011 [facility]) 05/02/2012  . Erectile dysfunction 05/02/2012  . Essential hypertension 04/17/2012   . Left rotator cuff tear 04/17/2012  . Sciatica 04/17/2012    Current medication list and allergy/intolerance information reviewed.    Current Outpatient Medications on File Prior to Visit  Medication Sig Dispense Refill  . Brinzolamide-Brimonidine 1-0.2 % SUSP Apply 1 drop to eye 3 (three) times daily.    . cetirizine (ZYRTEC) 10 MG tablet Take 10 mg by mouth daily.    . dorzolamide-timolol (COSOPT) 22.3-6.8 MG/ML ophthalmic solution 1 drop.    Marland Kitchen glucose blood test strip Use as instructed 100 each 99  . insulin glargine (LANTUS) 100 UNIT/ML injection Inject 15 Units into the skin at bedtime.    Marland Kitchen ketotifen (ZADITOR) 0.025 % ophthalmic solution Place 1 drop into both eyes 2 (two) times daily as needed (itchy eyes).    . Lancets MISC For use with glucometer qid as directed 100 each 99  . latanoprost (XALATAN) 0.005 % ophthalmic solution 1 drop at bedtime.    Marland Kitchen lisinopril (PRINIVIL,ZESTRIL) 5 MG tablet Take 1 tablet (5 mg total) by mouth daily. (Patient taking differently: Take 2.5 mg by mouth daily. ) 30 tablet 1  . metFORMIN (GLUCOPHAGE) 1000 MG tablet Take 1 tablet (1,000 mg total) daily with breakfast by mouth. 90 tablet 3  . montelukast (SINGULAIR) 10 MG tablet Take 1 tablet (10 mg total) by mouth at bedtime. For sinus pain. 30 tablet 3  . sildenafil (REVATIO) 20 MG tablet Take 1-3 tablets (20-60 mg total) by mouth daily as needed (sex). 50 tablet 2  . sulfamethoxazole-trimethoprim (BACTRIM DS,SEPTRA DS) 800-160 MG tablet Take 1 tablet by mouth 2 (two) times daily. 14 tablet 0  .  timolol (TIMOPTIC) 0.5 % ophthalmic solution Place 1 drop into both eyes daily.     No current facility-administered medications on file prior to visit.    Allergies  Allergen Reactions  . Aspirin     Gi bleeding  . Clindamycin/Lincomycin     rash      Review of Systems:  HEENT: No  headache, no vision change  Cardiac: No  chest pain  Skin: +Rash as per history of present illness, wound itself is  healing okay.  Neurologic: No  weakness, No  Dizziness   Exam:  BP 128/63   Pulse (!) 55   Temp 97.8 F (36.6 C) (Oral)   Wt 237 lb 1.9 oz (107.6 kg)   BMI 29.64 kg/m   Constitutional: VS see above. General Appearance: alert, well-developed, well-nourished, NAD  Respiratory: Normal respiratory effort.   Musculoskeletal: Gait normal. Symmetric and independent movement of all extremities  Neurological: Normal balance/coordination. No tremor.  Skin: warm, dry. Sutures in place, no drainage or erythema. Sutures removed without difficulty. Mild urticarial rash most visible on left forearm but everywhere else skin looks normal, dry.  Psychiatric: Normal judgment/insight. Normal mood and affect. Oriented x3.   Specimen Information: Wound     Component 7d ago  MICRO NUMBER: [id number]   SPECIMEN QUALITY: ADEQUATE   SOURCE: RIGHT SCALP   STATUS: FINAL   GRAM STAIN: No white blood cells seen No epithelial cells seen No organisms seen   ISOLATE 1: methicillin resistant Staphylococcus aureus Abnormal    Resulting Agency [facility]  Susceptibility    Methicillin resistant staphylococcus aureus    AEROBIC CULT, GRAM STAIN POSITIVE 1    CIPROFLOXACIN <=0.5  Sensitive    CLINDAMYCIN NR  Resistant    ERYTHROMYCIN >=8  Resistant    GENTAMICIN <=0.5  Sensitive    LEVOFLOXACIN <=0.12  Sensitive    OXACILLIN NR  Resistant1    TETRACYCLINE <=1  Sensitive    TRIMETH/SULFA <=10  Sensitive2    VANCOMYCIN 1  Sensitive         1 Oxacillin-resistant staphylococci are resistant to all currently available beta-lactam antimicrobial agents including penicillins, beta lactam/beta- lactamase inhibitor combinations, and cephems with staphylococcal indications, including Cefazolin.           Procedure:  Skin cleaned and sutures removed without difficulty.     ASSESSMENT/PLAN: The primary encounter diagnosis was Visit for suture removal. Diagnoses of MRSA (methicillin resistant Staphylococcus  aureus) infection and Rash due to allergy were also pertinent to this visit.   No widespread drug rash noted, questionable whether due to medication allergy versus other exposure. We'll go ahead and add the antibiotics to his list of drug intolerances.   Patient Instructions  For the rash: Try over-the-counter Claritin or Zyrtec or one of their generic equivalents, use this in place of the Benadryl. Can use over-the-counter hydrocortisone 1% cream, this is a mild steroid and should be safe to use every day for a few weeks if needed. If rash persists beyond a week or two, or if anything gets worse or changes, come in to let me take a look at it and reassess.     Follow-up plan: Return for Regularly scheduled diabetes follow-up in 3 months or so, sooner if needed.  Visit summary with medication list and pertinent instructions was printed for patient to review, alert [place] if any changes needed. All questions at time of visit were answered - patient instructed to contact office with any additional concerns. ER/RTC precautions  were reviewed with the patient and understanding verbalized.   Note: Total time spent 25 minutes, greater than 50% of the visit was spent face-to-face counseling and coordinating care for the following: The primary encounter diagnosis was Visit for suture removal. Diagnoses of MRSA (methicillin resistant Staphylococcus aureus) infection and Rash due to allergy were also pertinent to this visit.Marland Kitchen  Please note: voice recognition software was used to produce this document, and typos may escape review. Please contact Dr. [name] for any needed clarifications.

## 2017-07-19 NOTE — Patient Instructions (Signed)
For the rash: Try over-the-counter Claritin or Zyrtec or one of their generic equivalents, use this in place of the Benadryl. Can use over-the-counter hydrocortisone 1% cream, this is a mild steroid and should be safe to use every day for a few weeks if needed. If rash persists beyond a week or two, or if anything gets worse or changes, come in to let me take a look at it and reassess.

## 2017-08-02 DIAGNOSIS — Z79891 Long term (current) use of opiate analgesic: Secondary | ICD-10-CM | POA: Diagnosis not present

## 2017-08-02 DIAGNOSIS — H42 Glaucoma in diseases classified elsewhere: Secondary | ICD-10-CM | POA: Diagnosis not present

## 2017-08-02 DIAGNOSIS — Z881 Allergy status to other antibiotic agents status: Secondary | ICD-10-CM | POA: Diagnosis not present

## 2017-08-02 DIAGNOSIS — H2511 Age-related nuclear cataract, right eye: Secondary | ICD-10-CM | POA: Diagnosis not present

## 2017-08-02 DIAGNOSIS — H401113 Primary open-angle glaucoma, right eye, severe stage: Secondary | ICD-10-CM | POA: Diagnosis not present

## 2017-08-02 DIAGNOSIS — Z7951 Long term (current) use of inhaled steroids: Secondary | ICD-10-CM | POA: Diagnosis not present

## 2017-08-02 DIAGNOSIS — E1139 Type 2 diabetes mellitus with other diabetic ophthalmic complication: Secondary | ICD-10-CM | POA: Diagnosis not present

## 2017-08-02 DIAGNOSIS — Z886 Allergy status to analgesic agent status: Secondary | ICD-10-CM | POA: Diagnosis not present

## 2017-08-02 DIAGNOSIS — Z79899 Other long term (current) drug therapy: Secondary | ICD-10-CM | POA: Diagnosis not present

## 2017-08-02 DIAGNOSIS — Z882 Allergy status to sulfonamides status: Secondary | ICD-10-CM | POA: Diagnosis not present

## 2017-08-02 DIAGNOSIS — Z794 Long term (current) use of insulin: Secondary | ICD-10-CM | POA: Diagnosis not present

## 2017-08-02 DIAGNOSIS — Z87891 Personal history of nicotine dependence: Secondary | ICD-10-CM | POA: Diagnosis not present

## 2017-08-02 DIAGNOSIS — E1136 Type 2 diabetes mellitus with diabetic cataract: Secondary | ICD-10-CM | POA: Diagnosis not present

## 2017-08-03 DIAGNOSIS — H35039 Hypertensive retinopathy, unspecified eye: Secondary | ICD-10-CM | POA: Diagnosis not present

## 2017-08-03 DIAGNOSIS — Z48810 Encounter for surgical aftercare following surgery on the sense organs: Secondary | ICD-10-CM | POA: Diagnosis not present

## 2017-08-03 DIAGNOSIS — I10 Essential (primary) hypertension: Secondary | ICD-10-CM | POA: Diagnosis not present

## 2017-08-03 DIAGNOSIS — H401133 Primary open-angle glaucoma, bilateral, severe stage: Secondary | ICD-10-CM | POA: Diagnosis not present

## 2017-08-03 DIAGNOSIS — Z9841 Cataract extraction status, right eye: Secondary | ICD-10-CM | POA: Diagnosis not present

## 2017-08-03 DIAGNOSIS — Z87891 Personal history of nicotine dependence: Secondary | ICD-10-CM | POA: Diagnosis not present

## 2017-08-03 DIAGNOSIS — H2512 Age-related nuclear cataract, left eye: Secondary | ICD-10-CM | POA: Diagnosis not present

## 2017-08-04 ENCOUNTER — Encounter: Payer: Self-pay | Admitting: Osteopathic Medicine

## 2017-08-08 DIAGNOSIS — Z9841 Cataract extraction status, right eye: Secondary | ICD-10-CM | POA: Diagnosis not present

## 2017-08-08 DIAGNOSIS — H35039 Hypertensive retinopathy, unspecified eye: Secondary | ICD-10-CM | POA: Diagnosis not present

## 2017-08-08 DIAGNOSIS — H401133 Primary open-angle glaucoma, bilateral, severe stage: Secondary | ICD-10-CM | POA: Diagnosis not present

## 2017-08-08 DIAGNOSIS — H2512 Age-related nuclear cataract, left eye: Secondary | ICD-10-CM | POA: Diagnosis not present

## 2017-08-18 DIAGNOSIS — H401133 Primary open-angle glaucoma, bilateral, severe stage: Secondary | ICD-10-CM | POA: Diagnosis not present

## 2017-08-18 DIAGNOSIS — H35039 Hypertensive retinopathy, unspecified eye: Secondary | ICD-10-CM | POA: Diagnosis not present

## 2017-08-18 DIAGNOSIS — H2512 Age-related nuclear cataract, left eye: Secondary | ICD-10-CM | POA: Diagnosis not present

## 2017-08-30 DIAGNOSIS — Z48810 Encounter for surgical aftercare following surgery on the sense organs: Secondary | ICD-10-CM | POA: Diagnosis not present

## 2017-08-30 DIAGNOSIS — H2512 Age-related nuclear cataract, left eye: Secondary | ICD-10-CM | POA: Diagnosis not present

## 2017-08-30 DIAGNOSIS — H401133 Primary open-angle glaucoma, bilateral, severe stage: Secondary | ICD-10-CM | POA: Diagnosis not present

## 2017-08-30 DIAGNOSIS — Z9849 Cataract extraction status, unspecified eye: Secondary | ICD-10-CM | POA: Diagnosis not present

## 2017-08-30 DIAGNOSIS — H35039 Hypertensive retinopathy, unspecified eye: Secondary | ICD-10-CM | POA: Diagnosis not present

## 2017-09-05 DIAGNOSIS — H401113 Primary open-angle glaucoma, right eye, severe stage: Secondary | ICD-10-CM | POA: Diagnosis not present

## 2017-09-05 DIAGNOSIS — H35039 Hypertensive retinopathy, unspecified eye: Secondary | ICD-10-CM | POA: Diagnosis not present

## 2017-09-05 DIAGNOSIS — H401133 Primary open-angle glaucoma, bilateral, severe stage: Secondary | ICD-10-CM | POA: Diagnosis not present

## 2017-09-05 DIAGNOSIS — Z87891 Personal history of nicotine dependence: Secondary | ICD-10-CM | POA: Diagnosis not present

## 2017-09-05 DIAGNOSIS — H2512 Age-related nuclear cataract, left eye: Secondary | ICD-10-CM | POA: Diagnosis not present

## 2017-09-05 DIAGNOSIS — I10 Essential (primary) hypertension: Secondary | ICD-10-CM | POA: Diagnosis not present

## 2017-09-05 DIAGNOSIS — Z9841 Cataract extraction status, right eye: Secondary | ICD-10-CM | POA: Diagnosis not present

## 2017-09-13 DIAGNOSIS — Z9841 Cataract extraction status, right eye: Secondary | ICD-10-CM | POA: Diagnosis not present

## 2017-09-13 DIAGNOSIS — H2512 Age-related nuclear cataract, left eye: Secondary | ICD-10-CM | POA: Diagnosis not present

## 2017-09-13 DIAGNOSIS — H401133 Primary open-angle glaucoma, bilateral, severe stage: Secondary | ICD-10-CM | POA: Diagnosis not present

## 2017-09-13 DIAGNOSIS — Z961 Presence of intraocular lens: Secondary | ICD-10-CM | POA: Diagnosis not present

## 2017-10-04 ENCOUNTER — Encounter: Payer: Self-pay | Admitting: Osteopathic Medicine

## 2017-10-04 ENCOUNTER — Other Ambulatory Visit: Payer: Self-pay | Admitting: Osteopathic Medicine

## 2017-10-04 ENCOUNTER — Ambulatory Visit (INDEPENDENT_AMBULATORY_CARE_PROVIDER_SITE_OTHER): Payer: Medicare Other | Admitting: Osteopathic Medicine

## 2017-10-04 VITALS — BP 145/80 | HR 47 | Temp 97.8°F | Wt 237.1 lb

## 2017-10-04 DIAGNOSIS — E1165 Type 2 diabetes mellitus with hyperglycemia: Secondary | ICD-10-CM

## 2017-10-04 DIAGNOSIS — I10 Essential (primary) hypertension: Secondary | ICD-10-CM | POA: Diagnosis not present

## 2017-10-04 DIAGNOSIS — E1169 Type 2 diabetes mellitus with other specified complication: Secondary | ICD-10-CM

## 2017-10-04 DIAGNOSIS — Z794 Long term (current) use of insulin: Secondary | ICD-10-CM

## 2017-10-04 LAB — POCT GLYCOSYLATED HEMOGLOBIN (HGB A1C): Hemoglobin A1C: 6.5

## 2017-10-04 MED ORDER — INSULIN GLARGINE 100 UNIT/ML ~~LOC~~ SOLN: 10.0000 [IU] | Freq: Every day | SUBCUTANEOUS | 99 refills | Status: DC

## 2017-10-04 NOTE — Progress Notes (Signed)
HPI: Calvin Wise is a 72 y.o. male who  has a past medical history of Diabetes mellitus without complication ([place]), Erectile dysfunction (05/02/2012), Glaucoma (05/02/2012), History of GI bleed (2013), Irregular heartbeat (Declines Workup 09/2011 [facility]) (05/02/2012), Left rotator cuff tear (04/17/2012), and Prostate cancer ([place]).  he presents to [facility] today, 10/04/17,  for chief complaint of:  Diabetes follow-up  HTN: BP a bit elevated today, pt reports toothache. No CP/SOB, no VC//HA   DM2: Dx last year with insulin-dependent type 2 diabetes, hyperosmolar nonketotic state initially. Patient has been doing well with insulin injections. He is taking the metformin once per day and doing well on this. He has not had any additional hypoglycemic episodes since decrease of insulin to 15 units daily.  DIABETES SCREENING/PREVENTIVE CARE: A1C past 3-6 mos: Yes  controlled? Yes   03/09/16: 7.4  06/03/16: 11.5  07/06/16: 10.5  09/07/16: 7.0  12/07/16: 6.5  04/05/17: 6.2  07/05/17: 6.7  Today 10/04/17: 6.5  BP goal <130/80: No - reports pain today  LDL goal <70: Yes  Eye exam annually: Yes , importance discussed with patient Foot exam: done Microalbuminuria: n/a on ACE Metformin: Yes  ACE/ARB: Yes  Antiplatelet if ASCVD Risk >10%: No  Statin: No - has declined, but LDL was 53 last check 03/2017 Pneumovax: done   Toothache: For about a month or more, hasn't wanted to deal much with this since he has been getting glaucoma surgery   Results for orders placed or performed in visit on 10/04/17 (from the past 24 hour(s))  POCT HgB A1C     Status: None   Collection Time: 10/04/17  8:27 AM  Result Value Ref Range   Hemoglobin A1C 6.5      Past medical history, surgical history, social history and family history reviewed. No updates needed.   Current medication list and allergy/intolerance information reviewed.    Current Outpatient  Medications on File Prior to Visit  Medication Sig Dispense Refill  . Brinzolamide-Brimonidine 1-0.2 % SUSP Apply 1 drop to eye 3 (three) times daily.    . cetirizine (ZYRTEC) 10 MG tablet Take 10 mg by mouth daily.    . dorzolamide-timolol (COSOPT) 22.3-6.8 MG/ML ophthalmic solution 1 drop.    Marland Kitchen glucose blood test strip Use as instructed 100 each 99  . insulin glargine (LANTUS) 100 UNIT/ML injection Inject 15 Units into the skin at bedtime.    Marland Kitchen ketotifen (ZADITOR) 0.025 % ophthalmic solution Place 1 drop into both eyes 2 (two) times daily as needed (itchy eyes).    . Lancets MISC For use with glucometer qid as directed 100 each 99  . latanoprost (XALATAN) 0.005 % ophthalmic solution 1 drop at bedtime.    Marland Kitchen lisinopril (PRINIVIL,ZESTRIL) 5 MG tablet Take 1 tablet (5 mg total) by mouth daily. (Patient taking differently: Take 2.5 mg by mouth daily. ) 30 tablet 1  . metFORMIN (GLUCOPHAGE) 1000 MG tablet Take 1 tablet (1,000 mg total) daily with breakfast by mouth. 90 tablet 3  . montelukast (SINGULAIR) 10 MG tablet Take 1 tablet (10 mg total) by mouth at bedtime. For sinus pain. 30 tablet 3  . sildenafil (REVATIO) 20 MG tablet Take 1-3 tablets (20-60 mg total) by mouth daily as needed (sex). 50 tablet 2  . timolol (TIMOPTIC) 0.5 % ophthalmic solution Place 1 drop into both eyes daily.     No current facility-administered medications on file prior to visit.    Allergies  Allergen Reactions  .  Aspirin     Gi bleeding  . Clindamycin/Lincomycin     rash  . Bactrim [Sulfamethoxazole-Trimethoprim] Itching and Rash    See progress note from Dr. [name] 07/19/2017. No obvious drug rash, questionable other allergy exposure      Review of Systems:  Constitutional: No recent illness  HEENT: No  headache, no vision change  Cardiac: No  chest pain, No  pressure, No palpitations  Respiratory:  No  shortness of breath. No  Cough  Gastrointestinal: No  abdominal pain, no change on bowel  habits  Musculoskeletal: No new myalgia/arthralgia  Skin: No  Rash  Hem/Onc: No  easy bruising/bleeding, No  abnormal lumps/bumps  Neurologic: No  weakness, No  Dizziness  Psychiatric: No  concerns with depression, No  concerns with anxiety  Exam:  BP (!) 145/80   Pulse (!) 47   Temp 97.8 F (36.6 C) (Oral)   Wt 237 lb 1.9 oz (107.6 kg)   BMI 29.64 kg/m   Constitutional: VS see above. General Appearance: alert, well-developed, well-nourished, NAD  Eyes: Normal lids and conjunctive, non-icteric sclera  Ears, Nose, Mouth, Throat: MMM, Normal external inspection ears/nares/mouth/lips/gums.  Neck: No masses, trachea midline.   Respiratory: Normal respiratory effort. no wheeze, no rhonchi, no rales  Cardiovascular: S1/S2 normal, no murmur, no rub/gallop auscultated. RRR.   Musculoskeletal: Gait normal. Symmetric and independent movement of all extremities  Neurological: Normal balance/coordination. No tremor.  Skin: warm, dry, intact.   Psychiatric: Normal judgment/insight. Normal mood and affect. Oriented x3.     ASSESSMENT/PLAN:   Chronic issues stable, needs to see a dentist! OK to decrease insulin dose a bit, watch fasting Glc and if higher come see me   Type 2 diabetes mellitus with other specified complication, with long-term current use of insulin ([place]) - Plan: POCT HgB A1C, insulin glargine (LANTUS) 100 UNIT/ML injection  Uncontrolled type 2 diabetes mellitus with hyperosmolarity without coma ([place])  Essential hypertension       Follow-up plan: Return in about 3 months (around 01/04/2018) for A1C recheck diabetes, sooner if needed .  Visit summary with medication list and pertinent instructions was printed for patient to review, alert [place] if any changes needed. All questions at time of visit were answered - patient instructed to contact office with any additional concerns. ER/RTC precautions were reviewed with the patient and understanding verbalized.      Please note: voice recognition software was used to produce this document, and typos may escape review. Please contact Dr. [name] for any needed clarifications.

## 2017-10-10 DIAGNOSIS — H2512 Age-related nuclear cataract, left eye: Secondary | ICD-10-CM | POA: Diagnosis not present

## 2017-10-10 DIAGNOSIS — H401123 Primary open-angle glaucoma, left eye, severe stage: Secondary | ICD-10-CM | POA: Diagnosis not present

## 2017-10-10 DIAGNOSIS — H401133 Primary open-angle glaucoma, bilateral, severe stage: Secondary | ICD-10-CM | POA: Diagnosis not present

## 2017-10-11 DIAGNOSIS — Z961 Presence of intraocular lens: Secondary | ICD-10-CM | POA: Diagnosis not present

## 2017-10-11 DIAGNOSIS — H35039 Hypertensive retinopathy, unspecified eye: Secondary | ICD-10-CM | POA: Diagnosis not present

## 2017-10-11 DIAGNOSIS — H401133 Primary open-angle glaucoma, bilateral, severe stage: Secondary | ICD-10-CM | POA: Diagnosis not present

## 2017-10-11 DIAGNOSIS — H2512 Age-related nuclear cataract, left eye: Secondary | ICD-10-CM | POA: Diagnosis not present

## 2017-10-11 DIAGNOSIS — Z48810 Encounter for surgical aftercare following surgery on the sense organs: Secondary | ICD-10-CM | POA: Diagnosis not present

## 2017-10-11 DIAGNOSIS — Z9889 Other specified postprocedural states: Secondary | ICD-10-CM | POA: Diagnosis not present

## 2017-10-18 DIAGNOSIS — Z9842 Cataract extraction status, left eye: Secondary | ICD-10-CM | POA: Diagnosis not present

## 2017-10-18 DIAGNOSIS — Z961 Presence of intraocular lens: Secondary | ICD-10-CM | POA: Diagnosis not present

## 2017-10-18 DIAGNOSIS — I10 Essential (primary) hypertension: Secondary | ICD-10-CM | POA: Diagnosis not present

## 2017-10-18 DIAGNOSIS — Z87891 Personal history of nicotine dependence: Secondary | ICD-10-CM | POA: Diagnosis not present

## 2017-10-18 DIAGNOSIS — Z9841 Cataract extraction status, right eye: Secondary | ICD-10-CM | POA: Diagnosis not present

## 2017-10-18 DIAGNOSIS — H35039 Hypertensive retinopathy, unspecified eye: Secondary | ICD-10-CM | POA: Diagnosis not present

## 2017-10-18 DIAGNOSIS — H401123 Primary open-angle glaucoma, left eye, severe stage: Secondary | ICD-10-CM | POA: Diagnosis not present

## 2017-11-01 ENCOUNTER — Telehealth: Payer: Self-pay

## 2017-11-01 ENCOUNTER — Encounter: Payer: Self-pay | Admitting: Osteopathic Medicine

## 2017-11-01 DIAGNOSIS — Z789 Other specified health status: Secondary | ICD-10-CM | POA: Insufficient documentation

## 2017-11-01 NOTE — Telephone Encounter (Signed)
[facility] called stating if pcp would consider to re-evaluate pt, if appropriate for a statin medication. This is due to pt's current diagnosis for DM. [facility] can be reached at [phone]. Insurance is requesting this inquiry to be updated by Wednesday for well maintenance.

## 2017-11-01 NOTE — Telephone Encounter (Signed)
Will address at next visit 

## 2017-11-01 NOTE — Telephone Encounter (Signed)
Noted  

## 2017-11-13 DIAGNOSIS — Z48810 Encounter for surgical aftercare following surgery on the sense organs: Secondary | ICD-10-CM | POA: Diagnosis not present

## 2017-11-13 DIAGNOSIS — H401133 Primary open-angle glaucoma, bilateral, severe stage: Secondary | ICD-10-CM | POA: Diagnosis not present

## 2017-11-21 DIAGNOSIS — Z9842 Cataract extraction status, left eye: Secondary | ICD-10-CM | POA: Diagnosis not present

## 2017-11-21 DIAGNOSIS — Z48810 Encounter for surgical aftercare following surgery on the sense organs: Secondary | ICD-10-CM | POA: Diagnosis not present

## 2017-11-21 DIAGNOSIS — Z9841 Cataract extraction status, right eye: Secondary | ICD-10-CM | POA: Diagnosis not present

## 2017-11-21 DIAGNOSIS — I10 Essential (primary) hypertension: Secondary | ICD-10-CM | POA: Diagnosis not present

## 2017-11-21 DIAGNOSIS — Z87891 Personal history of nicotine dependence: Secondary | ICD-10-CM | POA: Diagnosis not present

## 2017-11-21 DIAGNOSIS — H401133 Primary open-angle glaucoma, bilateral, severe stage: Secondary | ICD-10-CM | POA: Diagnosis not present

## 2017-11-21 DIAGNOSIS — Z961 Presence of intraocular lens: Secondary | ICD-10-CM | POA: Diagnosis not present

## 2017-11-21 DIAGNOSIS — H35039 Hypertensive retinopathy, unspecified eye: Secondary | ICD-10-CM | POA: Diagnosis not present

## 2017-11-30 DIAGNOSIS — Z9841 Cataract extraction status, right eye: Secondary | ICD-10-CM | POA: Diagnosis not present

## 2017-11-30 DIAGNOSIS — H35039 Hypertensive retinopathy, unspecified eye: Secondary | ICD-10-CM | POA: Diagnosis not present

## 2017-11-30 DIAGNOSIS — H401133 Primary open-angle glaucoma, bilateral, severe stage: Secondary | ICD-10-CM | POA: Diagnosis not present

## 2017-11-30 DIAGNOSIS — Z9842 Cataract extraction status, left eye: Secondary | ICD-10-CM | POA: Diagnosis not present

## 2017-12-13 ENCOUNTER — Other Ambulatory Visit: Payer: Self-pay

## 2017-12-13 DIAGNOSIS — Z794 Long term (current) use of insulin: Principal | ICD-10-CM

## 2017-12-13 DIAGNOSIS — E1165 Type 2 diabetes mellitus with hyperglycemia: Secondary | ICD-10-CM

## 2017-12-13 MED ORDER — GLUCOSE BLOOD VI STRP: ORAL_STRIP | 11 refills | Status: DC

## 2017-12-14 ENCOUNTER — Other Ambulatory Visit: Payer: Self-pay

## 2017-12-14 MED ORDER — BLOOD GLUCOSE METER KIT: PACK | 0 refills | Status: DC

## 2018-01-03 ENCOUNTER — Encounter: Payer: Self-pay | Admitting: Osteopathic Medicine

## 2018-01-03 ENCOUNTER — Ambulatory Visit (INDEPENDENT_AMBULATORY_CARE_PROVIDER_SITE_OTHER): Payer: Medicare Other | Admitting: Osteopathic Medicine

## 2018-01-03 VITALS — BP 146/64 | HR 46 | Temp 98.1°F | Wt 239.9 lb

## 2018-01-03 DIAGNOSIS — Z794 Long term (current) use of insulin: Secondary | ICD-10-CM

## 2018-01-03 DIAGNOSIS — I10 Essential (primary) hypertension: Secondary | ICD-10-CM | POA: Diagnosis not present

## 2018-01-03 DIAGNOSIS — E11649 Type 2 diabetes mellitus with hypoglycemia without coma: Secondary | ICD-10-CM

## 2018-01-03 DIAGNOSIS — E1169 Type 2 diabetes mellitus with other specified complication: Secondary | ICD-10-CM | POA: Diagnosis not present

## 2018-01-03 DIAGNOSIS — R001 Bradycardia, unspecified: Secondary | ICD-10-CM | POA: Diagnosis not present

## 2018-01-03 LAB — POCT GLYCOSYLATED HEMOGLOBIN (HGB A1C): Hemoglobin A1C: 6.5 % — AB (ref 4.0–5.6)

## 2018-01-03 MED ORDER — INSULIN GLARGINE 100 UNIT/ML ~~LOC~~ SOLN: 10.0000 [IU] | Freq: Every day | SUBCUTANEOUS | 99 refills | Status: DC

## 2018-01-03 NOTE — Patient Instructions (Signed)
   Decrease insulin to 10 units daily  If any chest pain, dizziness, breathing troubles, please check your pulse, if in the 40's-50's and concerning symptoms, please seek medical attention

## 2018-01-03 NOTE — Progress Notes (Signed)
HPI: Calvin Wise is a 72 y.o. male who  has a past medical history of Diabetes mellitus without complication ([place]), Erectile dysfunction (05/02/2012), Glaucoma (05/02/2012), History of GI bleed (2013), Irregular heartbeat (Declines Workup 09/2011 [facility]) (05/02/2012), Left rotator cuff tear (04/17/2012), and Prostate cancer ([place]).  he presents to [facility] today, 01/03/18,  for chief complaint of:  DM2 follow-up   DM2: Hx: Dx last year with insulin-dependent type 2 diabetes, hyperosmolar nonketotic state initially. Patient has been doing well with insulin injections. He is taking the metformin once per day and doing well on this. As of last visit, he had not had any additional hypoglycemic episodes since decrease of insulin to 15 units daily. Today he reports occasional hypoglycemia with symptoms, Glc measured to 57 yesterday.  He states at that time, he was outside getting ready to cut the grass and felt a little woozy so went inside to check his blood sugar.  He ate something and sugar came back up to 113 and he went outside to finish his yard work without incident.  DIABETES SCREENING/PREVENTIVE CARE: A1C past 3-6 mos: Yes  controlled? Yes   03/09/16: 7.4  06/03/16: 11.5  07/06/16: 10.5  09/07/16: 7.0  12/07/16: 6.5  04/05/17: 6.2  07/05/17: 6.7  10/04/17: 6.5   Today 01/03/18: 6.5 - metformin 1000 mg daily, Lantus 13 units but some hypoglycemia as above BP goal <130/80: No - reports pain today  LDL goal <70: Yes  Eye exam annually: Yes , importance discussed with patient Foot exam: will do next visit  Microalbuminuria: n/a on ACE Metformin: Yes  ACE/ARB: Yes  Antiplatelet if ASCVD Risk >10%: No  Statin: No - has declined, but LDL was 53 last check 03/2017 Pneumovax: done   Bradycardia: this is second time a bit low. Previous EKG are older, back to 2016: rate 55 sinus, few PAC. He reports "my heart rate has always been a little  low."  No dizziness, no chest pain.  He knows how to check his own pulse but has not really been doing so  HTN: BP not quite at goal today. Was high last visit as well, at that time he reported toothache as the likely reason. Has been low in the past w/ increased dose meds - pt reluctant to adjust meds.   Glaucoma: Following with ophthalmology, has been taken off his eyedrops and doing well after procedure     Past medical history, surgical history, and family history reviewed.  Current medication list and allergy/intolerance information reviewed.   (See remainder of HPI, ROS, Phys Exam below)  BP (!) 146/64 (BP Location: Left Arm, Patient Position: Sitting, Cuff Size: Normal)   Pulse (!) 46   Temp 98.1 F (36.7 C) (Oral)   Wt 239 lb 14.4 oz (108.8 kg)   BMI 29.99 kg/m   EKG interpretation: Rate: 49 Rhythm: sinus Some artifact  No ST/T changes concerning for acute ischemia/infarct  Previous EKG no significant change    ASSESSMENT/PLAN:   Type 2 diabetes mellitus with hypoglycemia without coma, with long-term current use of insulin ([facility]) - Plan to decrease insulin to 10 units daily, fasting has been in normal range.  Rare episodes of hypoglycemia.  Precautions reviewed - Plan: POCT HgB A1C  Bradycardia - Asymptomatic, patient is not interested in labs or further work-up/referral at this time.  Advised buy home pulse ox monitor.  See patient instructions - Plan: EKG 12-Lead  Essential hypertension   Meds ordered  this encounter  Medications  . insulin glargine (LANTUS) 100 UNIT/ML injection    Sig: Inject 0.1 mLs (10 Units total) into the skin at bedtime.    Dispense:  10 mL    Refill:  99    Patient Instructions   Decrease insulin to 10 units daily  If any chest pain, dizziness, breathing troubles, please check your pulse, if in the 40's-50's and concerning symptoms, please seek medical attention   Follow-up plan: Return in about 3 months (around 04/05/2018) for  diabetes recheck, sooner if needed .     ############################################ ############################################ ############################################ ############################################    Outpatient Encounter Medications as of 01/03/2018  Medication Sig Note  . blood glucose meter kit and supplies Dispense based on patient and insurance preference.( ICD E11.9).   . Brinzolamide-Brimonidine 1-0.2 % SUSP Apply 1 drop to eye 3 (three) times daily.   . cetirizine (ZYRTEC) 10 MG tablet Take 10 mg by mouth daily.   . dorzolamide-timolol (COSOPT) 22.3-6.8 MG/ML ophthalmic solution 1 drop. 07/07/2014: [facility]ceived from: [facility]  . glucose blood (ACCU-CHEK GUIDE) test strip USE AS INSTRUCTED   . insulin glargine (LANTUS) 100 UNIT/ML injection Inject 0.1-0.13 mLs (10-13 Units total) into the skin at bedtime.   Marland Kitchen ketotifen (ZADITOR) 0.025 % ophthalmic solution Place 1 drop into both eyes 2 (two) times daily as needed (itchy eyes).   . Lancets MISC For use with glucometer qid as directed   . latanoprost (XALATAN) 0.005 % ophthalmic solution 1 drop at bedtime.   Marland Kitchen lisinopril (PRINIVIL,ZESTRIL) 5 MG tablet Take 1 tablet (5 mg total) by mouth daily.   . metFORMIN (GLUCOPHAGE) 1000 MG tablet Take 1 tablet (1,000 mg total) daily with breakfast by mouth.   . montelukast (SINGULAIR) 10 MG tablet Take 1 tablet (10 mg total) by mouth at bedtime. For sinus pain.   . sildenafil (REVATIO) 20 MG tablet Take 1-3 tablets (20-60 mg total) by mouth daily as needed (sex).   . timolol (TIMOPTIC) 0.5 % ophthalmic solution Place 1 drop into both eyes daily.    No facility-administered encounter medications on file as of 01/03/2018.    Allergies  Allergen Reactions  . Aspirin     Gi bleeding  . Clindamycin/Lincomycin     rash  . Bactrim [Sulfamethoxazole-Trimethoprim] Itching and Rash    See progress note from Dr. [name] 07/19/2017. No obvious drug  rash, questionable other allergy exposure      Review of Systems:  Constitutional: No recent illness  HEENT: No  headache, no vision change  Cardiac: No  chest pain, No  pressure, No palpitations  Respiratory:  No  shortness of breath. No  Cough  Gastrointestinal: No  abdominal pain  Musculoskeletal: No new myalgia/arthralgia  Neurologic: No  weakness, No  Dizziness  Psychiatric: No  concerns with depression, No  concerns with anxiety  Exam:  BP (!) 146/64 (BP Location: Left Arm, Patient Position: Sitting, Cuff Size: Normal)   Pulse (!) 46   Temp 98.1 F (36.7 C) (Oral)   Wt 239 lb 14.4 oz (108.8 kg)   BMI 29.99 kg/m   Constitutional: VS see above. General Appearance: alert, well-developed, well-nourished, NAD  Eyes: Normal lids and conjunctive, non-icteric sclera  Ears, Nose, Mouth, Throat: MMM, Normal external inspection ears/nares/mouth/lips/gums.  Neck: No masses, trachea midline.   Respiratory: Normal respiratory effort. no wheeze, no rhonchi, no rales  Cardiovascular: S1/S2 normal, no murmur, no rub/gallop auscultated.  Bradycardic with regular rhythm.   Musculoskeletal: Gait normal.  Symmetric and independent movement of all extremities  Neurological: Normal balance/coordination. No tremor.  Skin: warm, dry, intact.   Psychiatric: Normal judgment/insight. Normal mood and affect. Oriented x3.   Visit summary with medication list and pertinent instructions was printed for patient to review, advised to alert [place] if any changes needed. All questions at time of visit were answered - patient instructed to contact office with any additional concerns. ER/RTC precautions were reviewed with the patient and understanding verbalized.   Follow-up plan: Return in about 3 months (around 04/05/2018) for diabetes recheck, sooner if needed .  Note: Total time spent 25 minutes, greater than 50% of the visit was spent face-to-face counseling and coordinating care for the  following: The primary encounter diagnosis was Type 2 diabetes mellitus with hypoglycemia without coma, with long-term current use of insulin ([place]). Diagnoses of Bradycardia, Essential hypertension, and Type 2 diabetes mellitus with other specified complication, with long-term current use of insulin ([place]) were also pertinent to this visit.Marland Kitchen  Please note: voice recognition software was used to produce this document, and typos may escape review. Please contact Dr. [name] for any needed clarifications.

## 2018-01-16 DIAGNOSIS — Z9841 Cataract extraction status, right eye: Secondary | ICD-10-CM | POA: Diagnosis not present

## 2018-01-16 DIAGNOSIS — H35039 Hypertensive retinopathy, unspecified eye: Secondary | ICD-10-CM | POA: Diagnosis not present

## 2018-01-16 DIAGNOSIS — Z961 Presence of intraocular lens: Secondary | ICD-10-CM | POA: Diagnosis not present

## 2018-01-16 DIAGNOSIS — H401133 Primary open-angle glaucoma, bilateral, severe stage: Secondary | ICD-10-CM | POA: Diagnosis not present

## 2018-01-16 DIAGNOSIS — I10 Essential (primary) hypertension: Secondary | ICD-10-CM | POA: Diagnosis not present

## 2018-01-16 DIAGNOSIS — Z9842 Cataract extraction status, left eye: Secondary | ICD-10-CM | POA: Diagnosis not present

## 2018-01-16 DIAGNOSIS — Z87891 Personal history of nicotine dependence: Secondary | ICD-10-CM | POA: Diagnosis not present

## 2018-02-13 ENCOUNTER — Encounter: Payer: Self-pay | Admitting: Physician Assistant

## 2018-02-13 ENCOUNTER — Ambulatory Visit (INDEPENDENT_AMBULATORY_CARE_PROVIDER_SITE_OTHER): Payer: Medicare Other | Admitting: Physician Assistant

## 2018-02-13 VITALS — BP 158/72 | HR 53 | Temp 98.5°F | Wt 240.0 lb

## 2018-02-13 DIAGNOSIS — B9689 Other specified bacterial agents as the cause of diseases classified elsewhere: Secondary | ICD-10-CM

## 2018-02-13 DIAGNOSIS — J019 Acute sinusitis, unspecified: Secondary | ICD-10-CM | POA: Diagnosis not present

## 2018-02-13 MED ORDER — AMOXICILLIN-POT CLAVULANATE 875-125 MG PO TABS: 1.0000 | ORAL_TABLET | Freq: Two times a day (BID) | ORAL | 0 refills | Status: DC

## 2018-02-13 NOTE — Progress Notes (Signed)
HPI:                                                                Calvin Wise is a 72 y.o. male who presents to [facility]: [place] today for cough  Cough  This is a new problem. The current episode started 1 to 4 weeks ago (x 2.5 weeks). The problem has been unchanged. The problem occurs every few minutes. The cough is non-productive. Associated symptoms include nasal congestion and postnasal drip. Pertinent negatives include no chest pain, ear pain, fever, headaches, hemoptysis, rash, shortness of breath or wheezing. Nothing aggravates the symptoms. He has tried OTC cough suppressant for the symptoms. The treatment provided mild relief. There is no history of asthma, COPD or pneumonia.   Past Medical History:  Diagnosis Date  . Diabetes mellitus without complication ([place])    Type II  . Erectile dysfunction 05/02/2012  . Glaucoma 05/02/2012  . History of GI bleed 2013   Related to ASA use  . Irregular heartbeat (Declines Workup 09/2011 [facility]) 05/02/2012  . Left rotator cuff tear 04/17/2012  . Prostate cancer [facility])    prostate   Past Surgical History:  Procedure Laterality Date  . BACK SURGERY  1997  . HAND SURGERY Bilateral 20 YEARS AGO  . HERNIA REPAIR    . PROSTATE BIOPSY    . RADIOACTIVE SEED IMPLANT N/A 09/19/2014   Procedure: RADIOACTIVE SEED IMPLANT;  Surgeon: Ardis Hughs, MD;  Location: [facility];  Service: Urology;  Laterality: N/A;  . REFRACTIVE SURGERY    . SHOULDER ARTHROSCOPY Right 2007   x2   Social History   Tobacco Use  . Smoking status: Former Smoker    Packs/day: 0.25    Years: 44.00    Pack years: 11.00    Types: Cigarettes    Last attempt to quit: 05/24/2007    Years since quitting: 10.7  . Smokeless tobacco: Never Used  Substance Use Topics  . Alcohol use: No   family history includes Alcoholism in his father; Cancer in his cousin, cousin, and cousin.    ROS: negative except  as noted in the HPI  Medications: Current Outpatient Medications  Medication Sig Dispense Refill  . blood glucose meter kit and supplies Dispense based on patient and insurance preference.( ICD E11.9). 1 each 0  . cetirizine (ZYRTEC) 10 MG tablet Take 10 mg by mouth daily.    Marland Kitchen glucose blood (ACCU-CHEK GUIDE) test strip USE AS INSTRUCTED 100 each 11  . insulin glargine (LANTUS) 100 UNIT/ML injection Inject 0.1 mLs (10 Units total) into the skin at bedtime. 10 mL 99  . Lancets MISC For use with glucometer qid as directed 100 each 99  . lisinopril (PRINIVIL,ZESTRIL) 5 MG tablet Take 1 tablet (5 mg total) by mouth daily. 30 tablet 1  . metFORMIN (GLUCOPHAGE) 1000 MG tablet Take 1 tablet (1,000 mg total) daily with breakfast by mouth. 90 tablet 3  . montelukast (SINGULAIR) 10 MG tablet Take 1 tablet (10 mg total) by mouth at bedtime. For sinus pain. 30 tablet 3  . sildenafil (REVATIO) 20 MG tablet Take 1-3 tablets (20-60 mg total) by mouth daily as needed (sex). 50 tablet 2   No current facility-administered  medications for this visit.    Allergies  Allergen Reactions  . Aspirin Other (See Comments)    Gi bleeding  . Clindamycin Itching  . Clindamycin/Lincomycin     rash  . Bactrim [Sulfamethoxazole-Trimethoprim] Itching and Rash    See progress note from Dr. [name] 07/19/2017. No obvious drug rash, questionable other allergy exposure       Objective:  BP (!) 158/72   Pulse (!) 53   Temp 98.5 F (36.9 C) (Oral)   Wt 240 lb (108.9 kg)   SpO2 96%   BMI 30.00 kg/m  Gen:  alert, not ill-appearing, no distress, appropriate for age, obese male HEENT: head normocephalic without obvious abnormality, conjunctiva and cornea clear, wearing glasses, left TM slightly retracted, nasal mucosa edematous, left maxillary sinus tenderness, neck supple, no cervical adenopathy, trachea midline Pulm: Normal work of breathing, normal phonation, clear to auscultation bilaterally, no wheezes, rales  or rhonchi CV: bradycardic, regular rhythm, s1 and s2 distinct, no murmurs, clicks or rubs  Neuro: alert and oriented x 3, no tremor MSK: extremities atraumatic, normal gait and station Skin: intact, no rashes on exposed skin, no jaundice, no cyanosis   No results found for this or any previous visit (from the past 72 hour(s)). No results found.    Assessment and Plan: 72 y.o. male with   Acute bacterial sinusitis - Plan: amoxicillin-clavulanate (AUGMENTIN) 875-125 MG tablet - afebrile, no tachypnea, no tachycardia, SpO2 96% on RA at rest, no adventitious lung sounds - non-productive cough likely secondary to sinusitis with nasal symptoms present for >2 weeks. Treating empirically with Augmentin x 10 days, nasal toileting   Patient education and anticipatory guidance given Patient agrees with treatment plan Follow-up as needed if symptoms worsen or fail to improve  Darlyne Russian PA-C

## 2018-02-13 NOTE — Patient Instructions (Signed)

## 2018-02-15 ENCOUNTER — Telehealth: Payer: Self-pay

## 2018-02-15 NOTE — Telephone Encounter (Signed)
Noted. Pharmacy has been notified via fax. Confirmation has been rec'd.

## 2018-02-15 NOTE — Telephone Encounter (Signed)
Lipids were checked September 2018 off of statin medication.  LDL was 58.  Not sure were really getting improve health outcomes by lowering the LDL further.  Will defer further management of this issue to Dr. [name].  Discuss at next visit with her.

## 2018-02-15 NOTE — Telephone Encounter (Signed)
As per [facility]: "We spoke to your patient about diabetes care and noticed your patient has not filled a statin therapy at [facility] in the last 180 days." "Your patient would like [place] to reach out on their behalf to determine if it is appropriate to start a start statin therapy." Please indicate if this will be discuss at pt's next visit, not clinically appropriate or if a statin rx will be sent to the pharmacy.

## 2018-02-25 ENCOUNTER — Encounter: Payer: Self-pay | Admitting: Physician Assistant

## 2018-04-04 ENCOUNTER — Ambulatory Visit: Payer: Medicare Other | Admitting: Osteopathic Medicine

## 2018-04-18 ENCOUNTER — Ambulatory Visit: Payer: Medicare Other | Admitting: Osteopathic Medicine

## 2018-04-18 DIAGNOSIS — H401133 Primary open-angle glaucoma, bilateral, severe stage: Secondary | ICD-10-CM | POA: Diagnosis not present

## 2018-04-18 DIAGNOSIS — Z9841 Cataract extraction status, right eye: Secondary | ICD-10-CM | POA: Diagnosis not present

## 2018-04-18 DIAGNOSIS — Z87891 Personal history of nicotine dependence: Secondary | ICD-10-CM | POA: Diagnosis not present

## 2018-04-18 DIAGNOSIS — H35039 Hypertensive retinopathy, unspecified eye: Secondary | ICD-10-CM | POA: Diagnosis not present

## 2018-04-18 DIAGNOSIS — H57819 Brow ptosis, unspecified: Secondary | ICD-10-CM | POA: Diagnosis not present

## 2018-04-18 DIAGNOSIS — Z9842 Cataract extraction status, left eye: Secondary | ICD-10-CM | POA: Diagnosis not present

## 2018-04-18 DIAGNOSIS — Z961 Presence of intraocular lens: Secondary | ICD-10-CM | POA: Diagnosis not present

## 2018-05-02 ENCOUNTER — Encounter: Payer: Self-pay | Admitting: Osteopathic Medicine

## 2018-05-02 ENCOUNTER — Ambulatory Visit (INDEPENDENT_AMBULATORY_CARE_PROVIDER_SITE_OTHER): Payer: Medicare Other | Admitting: Osteopathic Medicine

## 2018-05-02 VITALS — BP 138/70 | HR 45 | Temp 98.2°F | Wt 243.2 lb

## 2018-05-02 DIAGNOSIS — R001 Bradycardia, unspecified: Secondary | ICD-10-CM

## 2018-05-02 DIAGNOSIS — E11649 Type 2 diabetes mellitus with hypoglycemia without coma: Secondary | ICD-10-CM

## 2018-05-02 DIAGNOSIS — Z794 Long term (current) use of insulin: Secondary | ICD-10-CM | POA: Diagnosis not present

## 2018-05-02 DIAGNOSIS — I10 Essential (primary) hypertension: Secondary | ICD-10-CM | POA: Diagnosis not present

## 2018-05-02 DIAGNOSIS — Z23 Encounter for immunization: Secondary | ICD-10-CM

## 2018-05-02 LAB — POCT GLYCOSYLATED HEMOGLOBIN (HGB A1C): HEMOGLOBIN A1C: 6.4 % — AB (ref 4.0–5.6)

## 2018-05-02 MED ORDER — ATORVASTATIN CALCIUM 40 MG PO TABS: 40.0000 mg | ORAL_TABLET | Freq: Every day | ORAL | 3 refills | Status: DC

## 2018-05-02 MED ORDER — ZOSTER VAC RECOMB ADJUVANTED 50 MCG/0.5ML IM SUSR: 0.5000 mL | Freq: Once | INTRAMUSCULAR | 1 refills | Status: AC

## 2018-05-02 NOTE — Progress Notes (Signed)
HPI: Calvin Wise is a 72 y.o. male who  has a past medical history of Diabetes mellitus without complication ([place]), Erectile dysfunction (05/02/2012), Glaucoma (05/02/2012), History of GI bleed (2013), Irregular heartbeat (Declines Workup 09/2011 [facility]) (05/02/2012), Left rotator cuff tear (04/17/2012), and Prostate cancer ([place]).  he presents to [facility] today, 05/02/18,  for chief complaint of:  DM2 follow-up   DM2:  Dx last year with insulin-dependent type 2 diabetes, hyperosmolar nonketotic state initially. Patient has been doing well with insulin injections. He is taking the metformin once per day and doing well on this.   Last visit reported hypoglycemia so we decreased insulin. He reported occasional hypoglycemia with symptoms, Glc measured to 57 prior to last visit.  He stated at that time, he was outside getting ready to cut the grass and felt a little woozy so went inside to check his blood sugar.  He ate something and sugar came back up to 113 and he went outside to finish his yard work without incident.  DIABETES SCREENING/PREVENTIVE CARE: A1C past 3-6 mos: Yes  controlled? Yes   03/09/16: 7.4  06/03/16: 11.5  07/06/16: 10.5  09/07/16: 7.0  12/07/16: 6.5  04/05/17: 6.2  07/05/17: 6.7  10/04/17: 6.5   01/03/18: 6.5 - metformin 1000 mg daily, Lantus 13 units but some hypoglycemia as above. We reduced to 10 units.   05/02/18 Today: 6.1 and no additional episodes of hypoglycemia on current medication. BP goal <130/80: No - reports pain today  LDL goal <70: Yes  Eye exam annually: Yes , importance discussed with patient Foot exam: will do next visit  Microalbuminuria: n/a on ACE Metformin: Yes  ACE/ARB: Yes  Antiplatelet if ASCVD Risk >10%: No  Statin: No - has declined, but LDL was 53 last check 03/2017.  Patient is now interested in possibly starting statin medication based on the recommendation of home visit  nurse. Pneumovax: done    HTN: BP evaded on intake but normalized after recheck.  Glaucoma: Following with ophthalmology, has been taken off his eyedrops and doing well after procedure     Past medical history, surgical history, and family history reviewed.  Current medication list and allergy/intolerance information reviewed.   (See remainder of HPI, ROS, Phys Exam below)  BP 138/70 (BP Location: Right Arm, Patient Position: Sitting, Cuff Size: Large)   Pulse (!) 45   Temp 98.2 F (36.8 C) (Oral)   Wt 243 lb 3.2 oz (110.3 kg)   BMI 30.40 kg/m   No results found for this or any previous visit (from the past 24 hour(s)).    ASSESSMENT/PLAN:   Type 2 diabetes mellitus with hypoglycemia without coma, with long-term current use of insulin (HCC) - Plan: POCT HgB A1C  Need for influenza vaccination - Plan: Flu vaccine HIGH DOSE PF (Fluzone High dose)  Essential hypertension  Bradycardia   Meds ordered this encounter  Medications  . Zoster Vaccine Adjuvanted [facility]) injection    Sig: Inject 0.5 mLs into the muscle once for 1 dose. Repeat dose 2-6 mos    Dispense:  0.5 mL    Refill:  1  . atorvastatin (LIPITOR) 40 MG tablet    Sig: Take 1 tablet (40 mg total) by mouth daily.    Dispense:  90 tablet    Refill:  3     Follow-up plan: Return in about 3 months (around 08/02/2018) for recheck A1C, sooner if needed.                      ############################################ ############################################ ############################################ ############################################  Outpatient Encounter Medications as of 05/02/2018  Medication Sig  . blood glucose meter kit and supplies Dispense based on patient and insurance preference.( ICD E11.9).  . cetirizine (ZYRTEC) 10 MG tablet Take 10 mg by mouth daily.  Marland Kitchen glucose blood (ACCU-CHEK GUIDE) test strip USE AS INSTRUCTED  . insulin glargine (LANTUS) 100 UNIT/ML  injection Inject 0.1 mLs (10 Units total) into the skin at bedtime.  . Lancets MISC For use with glucometer qid as directed  . lisinopril (PRINIVIL,ZESTRIL) 5 MG tablet Take 1 tablet (5 mg total) by mouth daily.  . metFORMIN (GLUCOPHAGE) 1000 MG tablet Take 1 tablet (1,000 mg total) daily with breakfast by mouth.  . montelukast (SINGULAIR) 10 MG tablet Take 1 tablet (10 mg total) by mouth at bedtime. For sinus pain.  . sildenafil (REVATIO) 20 MG tablet Take 1-3 tablets (20-60 mg total) by mouth daily as needed (sex).  Marland Kitchen amoxicillin-clavulanate (AUGMENTIN) 875-125 MG tablet Take 1 tablet by mouth 2 (two) times daily. (Patient not taking: Reported on 05/02/2018)   No facility-administered encounter medications on file as of 05/02/2018.    Allergies  Allergen Reactions  . Aspirin Other (See Comments)    Gi bleeding  . Clindamycin Itching  . Clindamycin/Lincomycin     rash  . Bactrim [Sulfamethoxazole-Trimethoprim] Itching and Rash    See progress note from Dr. [name] 07/19/2017. No obvious drug rash, questionable other allergy exposure      Review of Systems:  Constitutional: No recent illness  HEENT: No  headache, no vision change  Cardiac: No  chest pain, No  pressure, No palpitations  Respiratory:  No  shortness of breath. No  Cough  Gastrointestinal: No  abdominal pain  Musculoskeletal: No new myalgia/arthralgia  Neurologic: No  weakness, No  Dizziness  Psychiatric: No  concerns with depression, No  concerns with anxiety  Exam:  BP 138/70 (BP Location: Right Arm, Patient Position: Sitting, Cuff Size: Large)   Pulse (!) 45   Temp 98.2 F (36.8 C) (Oral)   Wt 243 lb 3.2 oz (110.3 kg)   BMI 30.40 kg/m   Constitutional: VS see above. General Appearance: alert, well-developed, well-nourished, NAD  Eyes: Normal lids and conjunctive, non-icteric sclera  Ears, Nose, Mouth, Throat: MMM, Normal external inspection ears/nares/mouth/lips/gums.  Neck: No masses, trachea  midline.   Respiratory: Normal respiratory effort. no wheeze, no rhonchi, no rales  Cardiovascular: S1/S2 normal, no murmur, no rub/gallop auscultated.  Bradycardic with regular rhythm.   Musculoskeletal: Gait normal. Symmetric and independent movement of all extremities  Neurological: Normal balance/coordination. No tremor.  Skin: warm, dry, intact.   Psychiatric: Normal judgment/insight. Normal mood and affect. Oriented x3.   Visit summary with medication list and pertinent instructions was printed for patient to review, advised to alert [place] if any changes needed. All questions at time of visit were answered - patient instructed to contact office with any additional concerns. ER/RTC precautions were reviewed with the patient and understanding verbalized.   Follow-up plan: Return in about 3 months (around 08/02/2018) for recheck A1C, sooner if needed.  Note: Total time spent 25 minutes, greater than 50% of the visit was spent face-to-face counseling and coordinating care for the following: The primary encounter diagnosis was Type 2 diabetes mellitus with hypoglycemia without coma, with long-term current use of insulin ([place]). Diagnoses of Need for influenza vaccination, Essential hypertension, and Bradycardia were also pertinent to this visit.Marland Kitchen  Please note: voice recognition software was used to produce this document, and typos may escape review.  Please contact Dr. [name] for any needed clarifications.

## 2018-06-06 DIAGNOSIS — Z9842 Cataract extraction status, left eye: Secondary | ICD-10-CM | POA: Diagnosis not present

## 2018-06-06 DIAGNOSIS — Z9841 Cataract extraction status, right eye: Secondary | ICD-10-CM | POA: Diagnosis not present

## 2018-06-06 DIAGNOSIS — Z961 Presence of intraocular lens: Secondary | ICD-10-CM | POA: Diagnosis not present

## 2018-06-06 DIAGNOSIS — H57813 Brow ptosis, bilateral: Secondary | ICD-10-CM | POA: Diagnosis not present

## 2018-06-06 DIAGNOSIS — Z79899 Other long term (current) drug therapy: Secondary | ICD-10-CM | POA: Diagnosis not present

## 2018-06-06 DIAGNOSIS — H401133 Primary open-angle glaucoma, bilateral, severe stage: Secondary | ICD-10-CM | POA: Diagnosis not present

## 2018-06-06 DIAGNOSIS — Z794 Long term (current) use of insulin: Secondary | ICD-10-CM | POA: Diagnosis not present

## 2018-06-06 DIAGNOSIS — E119 Type 2 diabetes mellitus without complications: Secondary | ICD-10-CM | POA: Diagnosis not present

## 2018-06-29 ENCOUNTER — Other Ambulatory Visit: Payer: Self-pay | Admitting: Osteopathic Medicine

## 2018-06-29 DIAGNOSIS — E1169 Type 2 diabetes mellitus with other specified complication: Secondary | ICD-10-CM

## 2018-06-29 DIAGNOSIS — Z794 Long term (current) use of insulin: Principal | ICD-10-CM

## 2018-08-01 ENCOUNTER — Ambulatory Visit (INDEPENDENT_AMBULATORY_CARE_PROVIDER_SITE_OTHER): Payer: Medicare Other | Admitting: Osteopathic Medicine

## 2018-08-01 ENCOUNTER — Encounter: Payer: Self-pay | Admitting: Osteopathic Medicine

## 2018-08-01 VITALS — BP 136/61 | HR 52 | Temp 98.4°F | Wt 236.6 lb

## 2018-08-01 DIAGNOSIS — I10 Essential (primary) hypertension: Secondary | ICD-10-CM | POA: Diagnosis not present

## 2018-08-01 DIAGNOSIS — G720 Drug-induced myopathy: Secondary | ICD-10-CM | POA: Diagnosis not present

## 2018-08-01 DIAGNOSIS — E1159 Type 2 diabetes mellitus with other circulatory complications: Secondary | ICD-10-CM | POA: Diagnosis not present

## 2018-08-01 DIAGNOSIS — Z794 Long term (current) use of insulin: Secondary | ICD-10-CM | POA: Diagnosis not present

## 2018-08-01 DIAGNOSIS — T466X5A Adverse effect of antihyperlipidemic and antiarteriosclerotic drugs, initial encounter: Secondary | ICD-10-CM

## 2018-08-01 DIAGNOSIS — E1169 Type 2 diabetes mellitus with other specified complication: Secondary | ICD-10-CM | POA: Diagnosis not present

## 2018-08-01 LAB — POCT GLYCOSYLATED HEMOGLOBIN (HGB A1C): Hemoglobin A1C: 7 % — AB (ref 4.0–5.6)

## 2018-08-01 MED ORDER — PRAVASTATIN SODIUM 20 MG PO TABS: 20.0000 mg | ORAL_TABLET | Freq: Every day | ORAL | 3 refills | Status: DC

## 2018-08-01 MED ORDER — COENZYME Q10 30 MG PO CAPS: 30.0000 mg | ORAL_CAPSULE | Freq: Every day | ORAL | 3 refills | Status: DC

## 2018-08-01 NOTE — Progress Notes (Signed)
HPI: Calvin Wise is a 73 y.o. male who  has a past medical history of Diabetes mellitus without complication ([place]), Erectile dysfunction (05/02/2012), Glaucoma (05/02/2012), History of GI bleed (2013), Irregular heartbeat (Declines Workup 09/2011 [facility]) (05/02/2012), Left rotator cuff tear (04/17/2012), and Prostate cancer ([place]).  he presents to [facility] today, 08/01/18,  for chief complaint of:  DM2 follow-up   DM2:  Dx with insulin-dependent type 2 diabetes, hyperosmolar nonketotic state 2017. Patient has been doing well with insulin injections. He is taking the metformin once per day and doing well on this.   Has had issues with Glc dropping low, this has resolved since reducing dose of insulin down to 10 units per day.   DIABETES SCREENING/PREVENTIVE CARE: A1C past 3-6 mos: Yes  controlled? Yes   03/09/16: 7.4. 06/03/16: 11.5. 07/06/16: 10.5. 09/07/16: 7.0. 12/07/16: 6.5. 04/05/17: 6.2. 07/05/17: 6.7. 10/04/17: 6.5.   01/03/18: 6.5 - metformin 1000 mg daily, Lantus 13 units but some hypoglycemia as above. We reduced to 10 units.   05/02/18: 6.1 and no additional episodes of hypoglycemia on current medication.  Today, 08/01/18: 7.0%  BP goal <254/27: close systolic, yes diastolic BP Readings from Last 3 Encounters:  08/01/18 136/61  05/02/18 138/70  02/13/18 (!) 158/72   Wt Readings from Last 3 Encounters:  08/01/18 236 lb 9.6 oz (107.3 kg)  05/02/18 243 lb 3.2 oz (110.3 kg)  02/13/18 240 lb (108.9 kg)   LDL goal <70: Yes  Eye exam annually: Yes , importance discussed with patient Foot exam: needs Microalbuminuria: n/a on ACE Metformin: Yes  ACE/ARB: Yes  Antiplatelet if ASCVD Risk >10%: No  Statin: yes, but reported muscle aches w/ atorvastatin.  Pneumovax: done  Immunization History  Administered Date(s) Administered  . Influenza Split 04/17/2012  . Influenza, High Dose Seasonal PF 04/05/2017, 05/02/2018  .  Influenza,inj,Quad PF,6+ Mos 04/24/2013, 03/25/2014, 04/15/2015, 03/09/2016  . Influenza-Unspecified 07/12/2011  . Pneumococcal Conjugate-13 09/01/2015  . Pneumococcal Polysaccharide-23 03/23/2012  . Tdap 04/15/2015  . Zoster 10/30/2013        Past medical history, surgical history, and family history reviewed.  Current medication list and allergy/intolerance information reviewed.   (See remainder of HPI, ROS, Phys Exam below)  BP 136/61 (BP Location: Left Arm, Patient Position: Sitting, Cuff Size: Normal)   Pulse (!) 52   Temp 98.4 F (36.9 C) (Oral)   Wt 236 lb 9.6 oz (107.3 kg)   BMI 29.57 kg/m   Results for orders placed or performed in visit on 08/01/18 (from the past 24 hour(s))  POCT HgB A1C     Status: Abnormal   Collection Time: 08/01/18 10:40 AM  Result Value Ref Range   Hemoglobin A1C 7.0 (A) 4.0 - 5.6 %   HbA1c POC (<> result, manual entry)     HbA1c, POC (prediabetic range)     HbA1c, POC (controlled diabetic range)           ASSESSMENT/PLAN: The primary encounter diagnosis was Type 2 diabetes mellitus with other specified complication, with long-term current use of insulin ([place]). Diagnoses of Hypertension associated with diabetes ([place]) and Statin myopathy were also pertinent to this visit.  Doing well overall  Will switch to lower potency statn + CoQ10 supplement   Follow-up plan: Return in about 3 months (around 10/31/2018) for A1C CHECK, DIABETES FOLLOW-UP, sooner if needed .                      ############################################ ############################################ ############################################ ############################################  Outpatient Encounter Medications as of 08/01/2018  Medication Sig  . atorvastatin (LIPITOR) 40 MG tablet Take 1 tablet (40 mg total) by mouth daily.  . blood glucose meter kit and supplies Dispense based on patient and insurance preference.( ICD E11.9).  .  cetirizine (ZYRTEC) 10 MG tablet Take 10 mg by mouth daily.  Marland Kitchen glucose blood (ACCU-CHEK GUIDE) test strip USE AS INSTRUCTED  . insulin glargine (LANTUS) 100 UNIT/ML injection Inject 0.1 mLs (10 Units total) into the skin at bedtime.  . Lancets MISC For use with glucometer qid as directed  . lisinopril (PRINIVIL,ZESTRIL) 5 MG tablet Take 1 tablet (5 mg total) by mouth daily.  . metFORMIN (GLUCOPHAGE) 1000 MG tablet TAKE 1 TABLET (1,000 MG TOTAL) DAILY WITH BREAKFAST BY MOUTH.  . montelukast (SINGULAIR) 10 MG tablet Take 1 tablet (10 mg total) by mouth at bedtime. For sinus pain.  . sildenafil (REVATIO) 20 MG tablet Take 1-3 tablets (20-60 mg total) by mouth daily as needed (sex).   No facility-administered encounter medications on file as of 08/01/2018.    Allergies  Allergen Reactions  . Aspirin Other (See Comments)    Gi bleeding  . Clindamycin Itching  . Clindamycin/Lincomycin     rash  . Bactrim [Sulfamethoxazole-Trimethoprim] Itching and Rash    See progress note from Dr. [name] 07/19/2017. No obvious drug rash, questionable other allergy exposure      Review of Systems:  Constitutional: No recent illness  HEENT: No  headache, no vision change  Cardiac: No  chest pain, No  pressure, No palpitations  Respiratory:  No  shortness of breath. No  Cough  Gastrointestinal: No  abdominal pain  Musculoskeletal: +new myalgia/arthralgia  Neurologic: No  weakness, No  Dizziness  Psychiatric: No  concerns with depression, No  concerns with anxiety  Exam:  BP 136/61 (BP Location: Left Arm, Patient Position: Sitting, Cuff Size: Normal)   Pulse (!) 52   Temp 98.4 F (36.9 C) (Oral)   Wt 236 lb 9.6 oz (107.3 kg)   BMI 29.57 kg/m   Constitutional: VS see above. General Appearance: alert, well-developed, well-nourished, NAD  Eyes: Normal lids and conjunctive, non-icteric sclera  Ears, Nose, Mouth, Throat: MMM, Normal external inspection ears/nares/mouth/lips/gums.  Neck: No  masses, trachea midline.   Respiratory: Normal respiratory effort. no wheeze, no rhonchi, no rales  Cardiovascular: S1/S2 normal, no murmur, no rub/gallop auscultated.  Bradycardic with regular rhythm.   Musculoskeletal: Gait normal. Symmetric and independent movement of all extremities  Neurological: Normal balance/coordination. No tremor.  Skin: warm, dry, intact.   Psychiatric: Normal judgment/insight. Normal mood and affect. Oriented x3.   Visit summary with medication list and pertinent instructions was printed for patient to review, advised to alert [place] if any changes needed. All questions at time of visit were answered - patient instructed to contact office with any additional concerns. ER/RTC precautions were reviewed with the patient and understanding verbalized.   Follow-up plan: Return in about 3 months (around 10/31/2018) for A1C CHECK, DIABETES FOLLOW-UP, sooner if needed .  Note: Total time spent 25 minutes, greater than 50% of the visit was spent face-to-face counseling and coordinating care for the following: The primary encounter diagnosis was Type 2 diabetes mellitus with other specified complication, with long-term current use of insulin ([place]). Diagnoses of Hypertension associated with diabetes ([place]) and Statin myopathy were also pertinent to this visit.Marland Kitchen  Please note: voice recognition software was used to produce this document, and typos may escape review. Please contact Dr. [name]  for any needed clarifications.

## 2018-08-20 DIAGNOSIS — H57813 Brow ptosis, bilateral: Secondary | ICD-10-CM | POA: Diagnosis not present

## 2018-08-20 DIAGNOSIS — E119 Type 2 diabetes mellitus without complications: Secondary | ICD-10-CM | POA: Diagnosis not present

## 2018-08-20 DIAGNOSIS — Z79899 Other long term (current) drug therapy: Secondary | ICD-10-CM | POA: Diagnosis not present

## 2018-08-20 DIAGNOSIS — Z87891 Personal history of nicotine dependence: Secondary | ICD-10-CM | POA: Diagnosis not present

## 2018-08-20 DIAGNOSIS — Z794 Long term (current) use of insulin: Secondary | ICD-10-CM | POA: Diagnosis not present

## 2018-08-20 DIAGNOSIS — J302 Other seasonal allergic rhinitis: Secondary | ICD-10-CM | POA: Diagnosis not present

## 2018-08-20 DIAGNOSIS — R001 Bradycardia, unspecified: Secondary | ICD-10-CM | POA: Diagnosis not present

## 2018-08-22 DIAGNOSIS — H35039 Hypertensive retinopathy, unspecified eye: Secondary | ICD-10-CM | POA: Diagnosis not present

## 2018-08-22 DIAGNOSIS — Z881 Allergy status to other antibiotic agents status: Secondary | ICD-10-CM | POA: Diagnosis not present

## 2018-08-22 DIAGNOSIS — H401133 Primary open-angle glaucoma, bilateral, severe stage: Secondary | ICD-10-CM | POA: Diagnosis not present

## 2018-08-22 DIAGNOSIS — H02831 Dermatochalasis of right upper eyelid: Secondary | ICD-10-CM | POA: Diagnosis not present

## 2018-08-22 DIAGNOSIS — H02834 Dermatochalasis of left upper eyelid: Secondary | ICD-10-CM | POA: Diagnosis not present

## 2018-08-22 DIAGNOSIS — Z882 Allergy status to sulfonamides status: Secondary | ICD-10-CM | POA: Diagnosis not present

## 2018-08-22 DIAGNOSIS — Z961 Presence of intraocular lens: Secondary | ICD-10-CM | POA: Diagnosis not present

## 2018-08-22 DIAGNOSIS — Z886 Allergy status to analgesic agent status: Secondary | ICD-10-CM | POA: Diagnosis not present

## 2018-08-22 DIAGNOSIS — H57813 Brow ptosis, bilateral: Secondary | ICD-10-CM | POA: Diagnosis not present

## 2018-09-03 DIAGNOSIS — E119 Type 2 diabetes mellitus without complications: Secondary | ICD-10-CM | POA: Diagnosis not present

## 2018-09-03 DIAGNOSIS — J302 Other seasonal allergic rhinitis: Secondary | ICD-10-CM | POA: Diagnosis not present

## 2018-09-03 DIAGNOSIS — Z794 Long term (current) use of insulin: Secondary | ICD-10-CM | POA: Diagnosis not present

## 2018-09-03 DIAGNOSIS — Z87891 Personal history of nicotine dependence: Secondary | ICD-10-CM | POA: Diagnosis not present

## 2018-09-03 DIAGNOSIS — H57813 Brow ptosis, bilateral: Secondary | ICD-10-CM | POA: Diagnosis not present

## 2018-09-03 DIAGNOSIS — Z79899 Other long term (current) drug therapy: Secondary | ICD-10-CM | POA: Diagnosis not present

## 2018-09-13 DIAGNOSIS — Z48810 Encounter for surgical aftercare following surgery on the sense organs: Secondary | ICD-10-CM | POA: Diagnosis not present

## 2018-09-13 DIAGNOSIS — H57813 Brow ptosis, bilateral: Secondary | ICD-10-CM | POA: Diagnosis not present

## 2018-10-09 LAB — CBC AND DIFFERENTIAL
HCT: 41 (ref 41–53)
Hemoglobin: 13.2 — AB (ref 13.5–17.5)
Platelets: 170 (ref 150–399)
WBC: 5

## 2018-10-09 LAB — HEPATIC FUNCTION PANEL
ALT: 30 (ref 10–40)
AST: 28 (ref 14–40)

## 2018-10-09 LAB — BASIC METABOLIC PANEL
BUN: 11 (ref 4–21)
Creatinine: 1 (ref 0.6–1.3)
Glucose: 103
Potassium: 3.6 (ref 3.4–5.3)
Sodium: 139 (ref 137–147)

## 2018-10-09 LAB — IRON,TIBC AND FERRITIN PANEL
Ferritin: 265.7
Iron: 86

## 2018-10-09 LAB — LIPID PANEL
Cholesterol: 83 (ref 0–200)
HDL: 46 (ref 35–70)
LDL Cholesterol: 22
Triglycerides: 73 (ref 40–160)

## 2018-10-09 LAB — VITAMIN B12: Vitamin B-12: 508

## 2018-10-09 LAB — HEMOGLOBIN A1C: Hemoglobin A1C: 7.2

## 2018-10-31 ENCOUNTER — Ambulatory Visit: Payer: Medicare Other | Admitting: Osteopathic Medicine

## 2018-11-07 ENCOUNTER — Other Ambulatory Visit: Payer: Self-pay | Admitting: Osteopathic Medicine

## 2018-11-07 DIAGNOSIS — E1169 Type 2 diabetes mellitus with other specified complication: Secondary | ICD-10-CM

## 2018-11-07 DIAGNOSIS — Z794 Long term (current) use of insulin: Principal | ICD-10-CM

## 2018-11-20 DIAGNOSIS — Z9889 Other specified postprocedural states: Secondary | ICD-10-CM | POA: Diagnosis not present

## 2018-12-06 ENCOUNTER — Other Ambulatory Visit: Payer: Self-pay | Admitting: *Deleted

## 2018-12-06 DIAGNOSIS — E1169 Type 2 diabetes mellitus with other specified complication: Secondary | ICD-10-CM

## 2018-12-06 MED ORDER — METFORMIN HCL 1000 MG PO TABS: 1000.0000 mg | ORAL_TABLET | Freq: Every day | ORAL | 1 refills | Status: DC

## 2018-12-12 ENCOUNTER — Ambulatory Visit: Payer: Medicare Other | Admitting: Osteopathic Medicine

## 2018-12-27 ENCOUNTER — Other Ambulatory Visit: Payer: Self-pay | Admitting: Osteopathic Medicine

## 2019-01-01 ENCOUNTER — Encounter: Payer: Self-pay | Admitting: Osteopathic Medicine

## 2019-01-01 ENCOUNTER — Ambulatory Visit (INDEPENDENT_AMBULATORY_CARE_PROVIDER_SITE_OTHER): Payer: Medicare Other | Admitting: Osteopathic Medicine

## 2019-01-01 VITALS — BP 128/61 | HR 75 | Temp 98.3°F | Wt 243.3 lb

## 2019-01-01 DIAGNOSIS — E1169 Type 2 diabetes mellitus with other specified complication: Secondary | ICD-10-CM

## 2019-01-01 DIAGNOSIS — Z794 Long term (current) use of insulin: Secondary | ICD-10-CM

## 2019-01-01 LAB — POCT GLYCOSYLATED HEMOGLOBIN (HGB A1C): Hemoglobin A1C: 6.8 % — AB (ref 4.0–5.6)

## 2019-01-01 MED ORDER — INSULIN GLARGINE 100 UNIT/ML ~~LOC~~ SOLN: 12.0000 [IU] | Freq: Every day | SUBCUTANEOUS | 99 refills | Status: AC

## 2019-01-01 NOTE — Progress Notes (Signed)
HPI: Calvin Wise is a 73 y.o. male who  has a past medical history of Diabetes mellitus without complication ([place]), Erectile dysfunction (05/02/2012), Glaucoma (05/02/2012), History of GI bleed (2013), Irregular heartbeat (Declines Workup 09/2011 [facility]) (05/02/2012), Left rotator cuff tear (04/17/2012), and Prostate cancer ([place]).  he presents to [facility] today, 01/01/19,  for chief complaint of:  DM2 follow-up   [facility] had increased long-acting insulin to 12 units daily Patient denies any issues with hypoglycemia on the increased dose.  States he is overall feeling really well.   DM2:  Dxwith insulin-dependent type 2 diabetes, hyperosmolar nonketotic state 2017. Patient has been doing well with insulin injections. He is taking the metformin once per day and doing well on this.   Has had issues with Glc dropping low, this has resolved since reducing dose of insulin down to 10 units per day.   DIABETES SCREENING/PREVENTIVE CARE: A1C past 3-6 [id number]? Yes  03/09/16: 7.4. 06/03/16: 11.5. 07/06/16: 10.5. 09/07/16: 7.0. 12/07/16: 6.5. 04/05/17: 6.2. 07/05/17: 6.7. 10/04/17: 6.5.   01/03/18: 6.5 - metformin 1000 mg daily, Lantus 13 units but some hypoglycemia as above. We reduced to 10 units.   05/02/18: 6.1 and no additional episodes of hypoglycemia on current medication.  08/01/18: 7.0%  TOday, 01/01/19: 6.8%  BP goal <130/80:yes BP Readings from Last 3 Encounters:  01/01/19 128/61  08/01/18 136/61  05/02/18 138/70   LDL goal <70:Yes  Eye exam annually:Yes, importance discussed with patient Foot exam:needs Microalbuminuria:n/a on ACE Metformin:Yes ACE/ARB:Yes Antiplatelet if ASCVD Risk >10%:No Statin:yes, but reported muscle aches w/ atorvastatin.  Pneumovax:done     At today's visit 01/01/19 ... PMH, PSH, FH reviewed and updated as needed.  Current medication list and allergy/intolerance hx reviewed  and updated as needed. (See remainder of HPI, ROS, Phys Exam below)    Patient brings printout of recent lab results from the [place], October 09, 2018. Normal WBC and hemoglobin Normal B12 and folate Normal iron A1c 7.2 No concerns on BMP Cholesterol was looking great, LDL was at goal Vitamin D was low at 19, daily supplement was ordered by the [place]    No results found.  No results found for this or any previous visit (from the past 72 hour(s)).        ASSESSMENT/PLAN: The encounter diagnosis was Type 2 diabetes mellitus with other specified complication, with long-term current use of insulin ([place]).   Orders Placed This Encounter  Procedures  . POCT HgB A1C    No concerns, conitnue routine monitoring    Follow-up plan: Return in about 3 months (around 04/03/2019) for recheck A1C, sooner if needed .                                                 ################################################# ################################################# ################################################# #################################################    Current Meds  Medication Sig  . Blood Glucose Monitoring Suppl (ACCU-CHEK GUIDE) w/Device KIT Inject 1 Stick into the skin 2 (two) times daily with breakfast and lunch. Dx E11.9 DM  . cetirizine (ZYRTEC) 10 MG tablet Take 10 mg by mouth daily.  Marland Kitchen co-enzyme Q-10 30 MG capsule Take 1 capsule (30 mg total) by mouth daily.  Marland Kitchen glucose blood (ACCU-CHEK GUIDE) test strip USE AS INSTRUCTED  . insulin glargine (LANTUS) 100 UNIT/ML injection Inject 0.1 mLs (10 Units total) into the skin  at bedtime.  . Lancets MISC For use with glucometer qid as directed  . lisinopril (PRINIVIL,ZESTRIL) 5 MG tablet Take 1 tablet (5 mg total) by mouth daily.  . metFORMIN (GLUCOPHAGE) 1000 MG tablet Take 1 tablet (1,000 mg total) by mouth daily with breakfast.  . montelukast (SINGULAIR) 10 MG tablet Take 1 tablet  (10 mg total) by mouth at bedtime. For sinus pain.  . pravastatin (PRAVACHOL) 20 MG tablet Take 1 tablet (20 mg total) by mouth daily.  . sildenafil (REVATIO) 20 MG tablet Take 1-3 tablets (20-60 mg total) by mouth daily as needed (sex).    Allergies  Allergen Reactions  . Aspirin Other (See Comments)    Gi bleeding  . Clindamycin Itching  . Clindamycin/Lincomycin     rash  . Bactrim [Sulfamethoxazole-Trimethoprim] Itching and Rash    See progress note from Dr. [name] 07/19/2017. No obvious drug rash, questionable other allergy exposure       Review of Systems:  Constitutional: No recent illness  Cardiac: No  chest pain, No  pressure, No palpitations  Respiratory:  No  shortness of breath. No  Cough  Gastrointestinal: No  abdominal pain, no change on bowel habits  Neurologic: No  weakness, No  Dizziness  Psychiatric: No  concerns with depression, No  concerns with anxiety  Exam:  BP 128/61 (BP Location: Left Arm, Patient Position: Sitting, Cuff Size: Normal)   Pulse 75   Temp 98.3 F (36.8 C) (Oral)   Wt 243 lb 4.8 oz (110.4 kg)   BMI 30.41 kg/m   Constitutional: VS see above. General Appearance: alert, well-developed, well-nourished, NAD  Eyes: Normal lids and conjunctive, non-icteric sclera  Ears, Nose, Mouth, Throat: MMM, Normal external inspection ears/nares/mouth/lips/gums.  Neck: No masses, trachea midline.   Respiratory: Normal respiratory effort. no wheeze, no rhonchi, no rales  Cardiovascular: S1/S2 normal, no murmur, no rub/gallop auscultated. RRR.   Musculoskeletal: Gait normal. Symmetric and independent movement of all extremities  Neurological: Normal balance/coordination. No tremor.  Skin: warm, dry, intact.   Psychiatric: Normal judgment/insight. Normal mood and affect. Oriented x3.       Visit summary with medication list and pertinent instructions was printed for patient to review, patient was advised to alert [place] if any updates are  needed. All questions at time of visit were answered - patient instructed to contact office with any additional concerns. ER/RTC precautions were reviewed with the patient and understanding verbalized.   Note: Total time spent 25 minutes, greater than 50% of the visit was spent face-to-face counseling and coordinating care for the following: The encounter diagnosis was Type 2 diabetes mellitus with other specified complication, with long-term current use of insulin ([place])..  Please note: voice recognition software was used to produce this document, and typos may escape review. Please contact Dr. [name] for any needed clarifications.    Follow up plan: Return in about 3 months (around 04/03/2019) for recheck A1C, sooner if needed .

## 2019-01-02 ENCOUNTER — Ambulatory Visit: Payer: Medicare Other | Admitting: Osteopathic Medicine

## 2019-01-16 DIAGNOSIS — Z79899 Other long term (current) drug therapy: Secondary | ICD-10-CM | POA: Diagnosis not present

## 2019-01-16 DIAGNOSIS — H35039 Hypertensive retinopathy, unspecified eye: Secondary | ICD-10-CM | POA: Diagnosis not present

## 2019-01-16 DIAGNOSIS — H02409 Unspecified ptosis of unspecified eyelid: Secondary | ICD-10-CM | POA: Diagnosis not present

## 2019-01-16 DIAGNOSIS — H57813 Brow ptosis, bilateral: Secondary | ICD-10-CM | POA: Diagnosis not present

## 2019-01-16 DIAGNOSIS — Z9889 Other specified postprocedural states: Secondary | ICD-10-CM | POA: Diagnosis not present

## 2019-01-16 DIAGNOSIS — H401133 Primary open-angle glaucoma, bilateral, severe stage: Secondary | ICD-10-CM | POA: Diagnosis not present

## 2019-03-05 ENCOUNTER — Other Ambulatory Visit: Payer: Self-pay | Admitting: Osteopathic Medicine

## 2019-03-05 DIAGNOSIS — H401133 Primary open-angle glaucoma, bilateral, severe stage: Secondary | ICD-10-CM | POA: Diagnosis not present

## 2019-03-05 DIAGNOSIS — H57819 Brow ptosis, unspecified: Secondary | ICD-10-CM | POA: Diagnosis not present

## 2019-03-05 DIAGNOSIS — H35039 Hypertensive retinopathy, unspecified eye: Secondary | ICD-10-CM | POA: Diagnosis not present

## 2019-03-05 DIAGNOSIS — Z79899 Other long term (current) drug therapy: Secondary | ICD-10-CM | POA: Diagnosis not present

## 2019-03-05 DIAGNOSIS — E1169 Type 2 diabetes mellitus with other specified complication: Secondary | ICD-10-CM

## 2019-03-05 NOTE — Telephone Encounter (Signed)
Forwarding medication refill request to PCP for review. 

## 2019-04-03 ENCOUNTER — Encounter: Payer: Self-pay | Admitting: Osteopathic Medicine

## 2019-04-03 ENCOUNTER — Ambulatory Visit (INDEPENDENT_AMBULATORY_CARE_PROVIDER_SITE_OTHER): Payer: Medicare Other | Admitting: Osteopathic Medicine

## 2019-04-03 ENCOUNTER — Other Ambulatory Visit: Payer: Self-pay

## 2019-04-03 VITALS — BP 163/57 | HR 44 | Temp 98.2°F | Wt 235.6 lb

## 2019-04-03 DIAGNOSIS — R001 Bradycardia, unspecified: Secondary | ICD-10-CM | POA: Diagnosis not present

## 2019-04-03 DIAGNOSIS — E1169 Type 2 diabetes mellitus with other specified complication: Secondary | ICD-10-CM

## 2019-04-03 DIAGNOSIS — Z23 Encounter for immunization: Secondary | ICD-10-CM | POA: Diagnosis not present

## 2019-04-03 DIAGNOSIS — Z794 Long term (current) use of insulin: Secondary | ICD-10-CM | POA: Diagnosis not present

## 2019-04-03 LAB — POCT GLYCOSYLATED HEMOGLOBIN (HGB A1C): Hemoglobin A1C: 6.7 % — AB (ref 4.0–5.6)

## 2019-04-03 NOTE — Progress Notes (Signed)
HPI: Calvin Wise is a 73 y.o. male who  has a past medical history of Diabetes mellitus without complication ([place]), Erectile dysfunction (05/02/2012), Glaucoma (05/02/2012), History of GI bleed (2013), Irregular heartbeat (Declines Workup 09/2011 [facility]) (05/02/2012), Left rotator cuff tear (04/17/2012), and Prostate cancer ([place]).  he presents to [facility] today, 04/03/19,  for chief complaint of:  DM2 follow-up   [facility] to 12 units daily Patient denies any issues with hypoglycemia on the increased dose.  States he is overall feeling really well.   DM2:  Dxwith insulin-dependent type 2 diabetes, hyperosmolar nonketotic state 2017. Patient has been doing well with insulin injections. He is taking the metformin once per day and doing well on this.   Has had issues with Glc dropping low, this has resolved since reducing dose of insulin down to 10 units per day.   DIABETES SCREENING/PREVENTIVE CARE: A1C past 3-6 [id number]? Yes  03/09/16: 7.4. 06/03/16: 11.5. 07/06/16: 10.5. 09/07/16: 7.0. 12/07/16: 6.5. 04/05/17: 6.2. 07/05/17: 6.7. 10/04/17: 6.5.   01/03/18: 6.5 - metformin 1000 mg daily, Lantus 13 units but some hypoglycemia as above. We reduced to 10 units.   05/02/18: 6.1 and no additional episodes of hypoglycemia on current medication.  08/01/18: 7.0%  01/01/19: 6.8*  04/03/19 today: 6.7% -reports he was recently seen at the [facility] and they increased his insulin back to 12 units daily.  He has not had any hypoglycemic episodes  BP goal <130/80:yes BP Readings from Last 3 Encounters:  04/03/19 (!) 163/57  01/01/19 128/61  08/01/18 136/61   LDL goal <70:Yes  Eye exam annually:Yes, importance discussed with patient Foot exam:needs Microalbuminuria:n/a on ACE Metformin:Yes ACE/ARB:Yes Antiplatelet if ASCVD Risk >10%:No Statin:yes, but reported muscle aches w/ atorvastatin.   Pneumovax:done     Bradycardia: On vitals, patient is asymptomatic, denies syncope/presyncope, dizziness other than occasional orthostasis, shortness of breath, palpitations.  Cardiac is longstanding  At today's visit 04/03/19 ... PMH, PSH, FH reviewed and updated as needed.  Current medication list and allergy/intolerance hx reviewed and updated as needed. (See remainder of HPI, ROS, Phys Exam below)  04/03/19 EKG interpretation: Rate: 45 Rhythm: sinus No ST/T changes concerning for acute ischemia/infarct  Previous EKG bradycardic as well    Patient brings printout of recent lab results from the [place], October 09, 2018. Normal WBC and hemoglobin Normal B12 and folate Normal iron A1c 7.2 No concerns on BMP Cholesterol was looking great, LDL was at goal Vitamin D was low at 19, daily supplement was ordered by the [place]    No results found.  Results for orders placed or performed in visit on 04/03/19 (from the past 72 hour(s))  POCT HgB A1C     Status: Abnormal   Collection Time: 04/03/19  9:19 AM  Result Value Ref Range   Hemoglobin A1C 6.7 (A) 4.0 - 5.6 %   HbA1c POC (<> result, manual entry)     HbA1c, POC (prediabetic range)     HbA1c, POC (controlled diabetic range)            ASSESSMENT/PLAN: The primary encounter diagnosis was Type 2 diabetes mellitus with other specified complication, with long-term current use of insulin ([place]). Diagnoses of Need for influenza vaccination and Bradycardia were also pertinent to this visit.  Recommended cardiology referral to evaluate for bradycardia, but patient is asymptomatic and would like to hold off on this at this point.  I think that is reasonable, but if he starts  having symptoms I advised him this may be something that would require a pacemaker or other medication adjustment.  Patient is to alert me if he experiences any significant issues with dizziness/presyncope, LOC, palpitations or other concerns.  [facility] is also  [facility].  If he needs to get an annual checkup there, that is fine, it sounds like his physicians there do not know that he is under my care although he states that " it should be on my chart" that he has another doctor, me.  Advised that too many cooks in the kitchen can be a patient safety concern!   Orders Placed This Encounter  Procedures  . Flu Vaccine QUAD High Dose(Fluad)  . POCT HgB A1C  . EKG 12-Lead      Follow-up plan: Return in about 3 months (around 07/03/2019) for A1C / diabetes follow-up. See me sooner if needed .                                                 ################################################# ################################################# ################################################# #################################################    No outpatient medications have been marked as taking for the 04/03/19 encounter (Office Visit) with [name], DO.    Allergies  Allergen Reactions  . Aspirin Other (See Comments)    Gi bleeding  . Clindamycin Itching  . Clindamycin/Lincomycin     rash  . Bactrim [Sulfamethoxazole-Trimethoprim] Itching and Rash    See progress note from Dr. [name] 07/19/2017. No obvious drug rash, questionable other allergy exposure       Review of Systems:  Constitutional: No recent illness  Cardiac: No  chest pain, No  pressure, No palpitations  Respiratory:  No  shortness of breath. No  Cough  Gastrointestinal: No  abdominal pain, no change on bowel habits  Neurologic: No  weakness, No  Dizziness  Psychiatric: No  concerns with depression, No  concerns with anxiety  Exam:  BP (!) 163/57 (BP Location: Left Arm, Patient Position: Sitting, Cuff Size: Normal)   Pulse (!) 44   Temp 98.2 F (36.8 C) (Oral)   Wt 235 lb 9.6 oz (106.9 kg)   BMI 29.45 kg/m   Constitutional: VS see above. General  Appearance: alert, well-developed, well-nourished, NAD  Eyes: Normal lids and conjunctive, non-icteric sclera  Ears, Nose, Mouth, Throat: MMM, Normal external inspection ears/nares/mouth/lips/gums.  Neck: No masses, trachea midline.   Respiratory: Normal respiratory effort. no wheeze, no rhonchi, no rales  Cardiovascular: S1/S2 normal, no murmur, no rub/gallop auscultated. RRR.   Musculoskeletal: Gait normal. Symmetric and independent movement of all extremities  Neurological: Normal balance/coordination. No tremor.  Skin: warm, dry, intact.   Psychiatric: Normal judgment/insight. Normal mood and affect. Oriented x3.       Visit summary with medication list and pertinent instructions was printed for patient to review, patient was advised to alert [place] if any updates are needed. All questions at time of visit were answered - patient instructed to contact office with any additional concerns. ER/RTC precautions were reviewed with the patient and understanding verbalized.   Note: Total time spent 25 minutes, greater than 50% of the visit was spent face-to-face counseling and coordinating care for the following: The encounter diagnosis was Type 2 diabetes mellitus with other specified complication, with long-term current use of insulin ([place])..  Please note: voice  recognition software was used to produce this document, and typos may escape review. Please contact Dr. [name] for any needed clarifications.    Follow up plan: Return in about 3 months (around 07/03/2019) for A1C / diabetes follow-up. See me sooner if needed .

## 2019-04-03 NOTE — Patient Instructions (Addendum)

## 2019-04-25 ENCOUNTER — Encounter: Payer: Self-pay | Admitting: Osteopathic Medicine

## 2019-07-03 ENCOUNTER — Ambulatory Visit (INDEPENDENT_AMBULATORY_CARE_PROVIDER_SITE_OTHER): Payer: Medicare Other | Admitting: Osteopathic Medicine

## 2019-07-03 ENCOUNTER — Encounter: Payer: Self-pay | Admitting: Osteopathic Medicine

## 2019-07-03 ENCOUNTER — Other Ambulatory Visit: Payer: Self-pay

## 2019-07-03 VITALS — BP 133/69 | HR 49 | Temp 97.9°F | Wt 241.0 lb

## 2019-07-03 DIAGNOSIS — Z794 Long term (current) use of insulin: Secondary | ICD-10-CM

## 2019-07-03 DIAGNOSIS — E11649 Type 2 diabetes mellitus with hypoglycemia without coma: Secondary | ICD-10-CM

## 2019-07-03 LAB — POCT GLYCOSYLATED HEMOGLOBIN (HGB A1C): Hemoglobin A1C: 6.5 % — AB (ref 4.0–5.6)

## 2019-07-03 MED ORDER — PRAVASTATIN SODIUM 20 MG PO TABS: 20.0000 mg | ORAL_TABLET | Freq: Every day | ORAL | 3 refills | Status: DC

## 2019-07-03 NOTE — Progress Notes (Signed)
HPI: Calvin Wise is a 73 y.o. male who  has a past medical history of Diabetes mellitus without complication ([place]), Erectile dysfunction (05/02/2012), Glaucoma (05/02/2012), History of GI bleed (2013), Irregular heartbeat (Declines Workup 09/2011 [facility]) (05/02/2012), Left rotator cuff tear (04/17/2012), and Prostate cancer ([place]).  he presents to [facility] today, 07/03/19,  for chief complaint of:    DM2:  Dxwith insulin-dependent type 2 diabetes, hyperosmolar nonketotic state2017. Patient has been doing well with insulin injections. He is taking the metformin once per day and doing well on this.  Has had issues with Glc dropping low, this has resolved since reducing dose of insulin.  DIABETES SCREENING/PREVENTIVE CARE: A1C past 3-6 [id number]? Yes  03/09/16: 7.4.06/03/16: 11.5.07/06/16: 10.5.09/07/16: 7.0.12/07/16: 6.5.04/05/17: 6.2.07/05/17: 6.7.10/04/17: 6.5.  01/03/18: 6.5 - metformin 1000 mg daily, Lantus 13 units but some hypoglycemia as above. We reduced to 10 units.   05/02/18: 6.1 and no additional episodes of hypoglycemia on current medication.  08/01/18: 7.0%  01/01/19: 6.8*  04/03/19: 6.7% -reports he was recently seen at the [place] and they increased his insulin back to 12 units daily.  He has not had any hypoglycemic episodes. Advised to elt one doctor manage his diabetes, if he needs to see the [facility] for physicals etc that's fine but we shouldn't have 2 doctors trying to manage the same issue.   07/03/19 today: 6.5% no hypoglycemia, still on 10-12 units insulin daily without any problems.     At today's visit 07/03/19 ... PMH, PSH, FH reviewed and updated as needed.  Current medication list and allergy/intolerance hx reviewed and updated as needed. (See remainder of HPI, ROS, Phys Exam below)   No results found.  Results for orders placed or performed in visit on 07/03/19 (from the past 72 hour(s))  POCT  HgB A1C     Status: Abnormal   Collection Time: 07/03/19  8:56 AM  Result Value Ref Range   Hemoglobin A1C 6.5 (A) 4.0 - 5.6 %   HbA1c POC (<> result, manual entry)     HbA1c, POC (prediabetic range)     HbA1c, POC (controlled diabetic range)            ASSESSMENT/PLAN: The encounter diagnosis was Type 2 diabetes mellitus with hypoglycemia without coma, with long-term current use of insulin ([place]).  No change in meds needed, pt doing well  Orders Placed This Encounter  Procedures  . POCT HgB A1C     No orders of the defined types were placed in this encounter.   There are no Patient Instructions on file for this visit.    Follow-up plan: Return in about 4 months (around 11/01/2019) for recheck A1C in office - see me sooner if needed! .Will be due for labs around then for annual monitoring.                                                  ################################################# ################################################# ################################################# #################################################    Current Meds  Medication Sig  . Blood Glucose Monitoring Suppl (ACCU-CHEK GUIDE) w/Device KIT Inject 1 Stick into the skin 2 (two) times daily with breakfast and lunch. Dx E11.9 DM  . cetirizine (ZYRTEC) 10 MG tablet Take 10 mg by mouth daily.  Marland Kitchen co-enzyme Q-10 30 MG capsule Take 1 capsule (30 mg total) by mouth daily.  Marland Kitchen  glucose blood (ACCU-CHEK GUIDE) test strip USE AS INSTRUCTED  . insulin glargine (LANTUS) 100 UNIT/ML injection Inject 0.12 mLs (12 Units total) into the skin at bedtime.  . Lancets MISC For use with glucometer qid as directed  . lisinopril (PRINIVIL,ZESTRIL) 5 MG tablet Take 1 tablet (5 mg total) by mouth daily.  . metFORMIN (GLUCOPHAGE) 1000 MG tablet TAKE 1 TABLET BY MOUTH EVERY DAY WITH BREAKFAST  . montelukast (SINGULAIR) 10 MG tablet Take 1 tablet (10 mg total) by  mouth at bedtime. For sinus pain.  . pravastatin (PRAVACHOL) 20 MG tablet Take 1 tablet (20 mg total) by mouth daily.  . sildenafil (REVATIO) 20 MG tablet Take 1-3 tablets (20-60 mg total) by mouth daily as needed (sex).    Allergies  Allergen Reactions  . Aspirin Other (See Comments)    Gi bleeding  . Clindamycin Itching  . Clindamycin/Lincomycin     rash  . Bactrim [Sulfamethoxazole-Trimethoprim] Itching and Rash    See progress note from Dr. [name] 07/19/2017. No obvious drug rash, questionable other allergy exposure       Review of Systems:  Constitutional: No recent illness  Cardiac: No  chest pain, No  pressure, No palpitations  Respiratory:  No  shortness of breath. No  Cough  Neurologic: No  weakness, No  Dizziness  Psychiatric: No  concerns with depression, No  concerns with anxiety  Exam:  BP 133/69 (BP Location: Left Arm, Patient Position: Sitting, Cuff Size: Large)   Pulse (!) 49   Temp 97.9 F (36.6 C) (Oral)   Wt 241 lb (109.3 kg)   SpO2 99%   BMI 30.12 kg/m   Constitutional: VS see above. General Appearance: alert, well-developed, well-nourished, NAD  Eyes: Normal lids and conjunctive, non-icteric sclera  Ears, Nose, Mouth, Throat: MMM, Normal external inspection ears/nares/mouth/lips/gums.  Neck: No masses, trachea midline.   Respiratory: Normal respiratory effort. no wheeze, no rhonchi, no rales  Cardiovascular: S1/S2 normal, no murmur, no rub/gallop auscultated. Bradycardic but regular   Musculoskeletal: Gait normal. Symmetric and independent movement of all extremities  Neurological: Normal balance/coordination. No tremor.  Skin: warm, dry, intact.   Psychiatric: Normal judgment/insight. Normal mood and affect. Oriented x3.       Visit summary with medication list and pertinent instructions was printed for patient to review, patient was advised to alert [place] if any updates are needed. All questions at time of visit were answered -  patient instructed to contact office with any additional concerns. ER/RTC precautions were reviewed with the patient and understanding verbalized.   Note: Total time spent 15 minutes, greater than 50% of the visit was spent face-to-face counseling and coordinating care for the following: The encounter diagnosis was Type 2 diabetes mellitus with hypoglycemia without coma, with long-term current use of insulin ([place])..  Please note: voice recognition software was used to produce this document, and typos may escape review. Please contact Dr. [name] for any needed clarifications.    Follow up plan: Return in about 4 months (around 11/01/2019) for recheck A1C in office - see me sooner if needed! Marland Kitchen

## 2019-08-08 DIAGNOSIS — H401133 Primary open-angle glaucoma, bilateral, severe stage: Secondary | ICD-10-CM | POA: Diagnosis not present

## 2019-08-08 DIAGNOSIS — Z79899 Other long term (current) drug therapy: Secondary | ICD-10-CM | POA: Diagnosis not present

## 2019-08-08 DIAGNOSIS — H35039 Hypertensive retinopathy, unspecified eye: Secondary | ICD-10-CM | POA: Diagnosis not present

## 2019-08-08 DIAGNOSIS — H57819 Brow ptosis, unspecified: Secondary | ICD-10-CM | POA: Diagnosis not present

## 2019-08-08 DIAGNOSIS — Z961 Presence of intraocular lens: Secondary | ICD-10-CM | POA: Diagnosis not present

## 2019-08-08 DIAGNOSIS — H57813 Brow ptosis, bilateral: Secondary | ICD-10-CM | POA: Diagnosis not present

## 2019-08-08 NOTE — Progress Notes (Signed)
Subjective:   Calvin Wise is a 74 y.o. male who presents for Medicare Annual/Subsequent preventive examination.  Review of Systems:  No ROS.  Medicare Wellness Virtual Visit.  Visual/audio telehealth visit, UTA vital signs.   See social history for additional risk factors.    Cardiac Risk Factors include: advanced age (>38mn, >>43women);diabetes mellitus;male gender  Sleep patterns: Getting 7-8 hours of sleep a night. Does not wake up to void. Wakes up and feels rested. Home Safety/Smoke Alarms: Feels safe in home. Smoke alarms in place.  Living environment; Lives with wife in 1 story home no stairs in the home. Shower is a step over tub combo and grab bars in place Seat Belt Safety/Bike Helmet: Wears seat belt.   Male:   CCS-  UTD  PSA- December- UTD Lab Results  Component Value Date   PSA 5.31 (H) 03/25/2014   PSA 4.54 (H) 10/30/2013        Objective:    Vitals: Ht 6' 3"  (1.905 m)   Wt 235 lb (106.6 kg)   BMI 29.37 kg/m   Body mass index is 29.37 kg/m.  Advanced Directives 08/13/2019 10/19/2016 09/19/2014 07/07/2014  Does Patient Have a Medical Advance Directive? Yes Yes No No  Type of Advance Directive Living will Living will - -  Does patient want to make changes to medical advance directive? No - Patient declined - - -  [place]? - Yes - -  Would patient like information on creating a medical advance directive? - No - Patient declined No - patient declined information Yes - Educational materials given    Tobacco Social History   Tobacco Use  Smoking Status Former Smoker  . Packs/day: 0.25  . Years: 44.00  . Pack years: 11.00  . Types: Cigarettes  . Quit date: 05/24/2007  . Years since quitting: 12.2  Smokeless Tobacco Never Used     Counseling given: Not Answered   Clinical Intake:  Pre-visit preparation completed: Yes  Pain : No/denies pain     Nutritional Risks: None Diabetes: Yes CBG done?:  No(yesterday evening 140) Did pt. bring in CBG monitor from home?: No  How often do you need to have someone help you when you read instructions, pamphlets, or other written materials from your doctor or pharmacy?: 1 - Never What is the last grade level you completed in school?: 14  Interpreter Needed?: No  Information entered by :: KOrlie Dakin LPN  Past Medical History:  Diagnosis Date  . Diabetes mellitus without complication ([place]    Type II  . Erectile dysfunction 05/02/2012  . Glaucoma 05/02/2012  . History of GI bleed 2013   Related to ASA use  . Irregular heartbeat (Declines Workup 09/2011 [facility]) 05/02/2012  . Left rotator cuff tear 04/17/2012  . Prostate cancer ([facility]    prostate   Past Surgical History:  Procedure Laterality Date  . BACK SURGERY  1997  . HAND SURGERY Bilateral 20 YEARS AGO  . HERNIA REPAIR    . PROSTATE BIOPSY    . RADIOACTIVE SEED IMPLANT N/A 09/19/2014   Procedure: RADIOACTIVE SEED IMPLANT;  Surgeon: BArdis Hughs MD;  [facility]  Service: Urology;  Laterality: N/A;  . REFRACTIVE SURGERY    . SHOULDER ARTHROSCOPY Right 2007   x2   Family History  Problem Relation Age of Onset  . Alcoholism Father   . Cancer Cousin   . Cancer Cousin   .  Cancer Cousin    Social History   Socioeconomic History  . Marital status: Married    Spouse name: [name]  . Number of children: 3  . Years of education: 50  . Highest education level: Some college, no degree  Occupational History    Comment: [employer]  Tobacco Use  . Smoking status: Former Smoker    Packs/day: 0.25    Years: 44.00    Pack years: 11.00    Types: Cigarettes    Quit date: 05/24/2007    Years since quitting: 12.2  . Smokeless tobacco: Never Used  Substance and Sexual Activity  . Alcohol use: Yes    Comment: occasion  . Drug use: No  . Sexual activity: Yes  Other Topics Concern  . 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   Social Determinants of Health   Financial Resource Strain:   . Difficulty of Paying Living Expenses: Not on file  Food Insecurity:   . Worried About Charity fundraiser in the Last Year: Not on file  . Ran Out of Food in the Last Year: Not on file  Transportation Needs:   . Lack of Transportation (Medical): Not on file  . Lack of Transportation (Non-Medical): Not on file  Physical Activity:   . Days of Exercise per Week: Not on file  . Minutes of Exercise per Session: Not on file  Stress:   . Feeling of Stress : Not on file  Social Connections:   . Frequency of Communication with Friends and Family: Not on file  . Frequency of Social Gatherings with Friends and Family: Not on file  . Attends Religious Services: Not on file  . Active Member of Clubs or Organizations: Not on file  . Attends Archivist Meetings: Not on file  . Marital Status: Not on file    Outpatient Encounter Medications as of 08/13/2019  Medication Sig  . Blood Glucose Monitoring Suppl (ACCU-CHEK GUIDE) w/Device KIT Inject 1 Stick into the skin 2 (two) times daily with breakfast and lunch. Dx E11.9 DM  . cetirizine (ZYRTEC) 10 MG tablet Take 10 mg by mouth daily.  Marland Kitchen co-enzyme Q-10 30 MG capsule Take 1 capsule (30 mg total) by mouth daily.  Marland Kitchen glucose blood (ACCU-CHEK GUIDE) test strip USE AS INSTRUCTED  . insulin glargine (LANTUS) 100 UNIT/ML injection Inject 0.12 mLs (12 Units total) into the skin at bedtime.  . Lancets MISC For use with glucometer qid as directed  . lisinopril (PRINIVIL,ZESTRIL) 5 MG tablet Take 1 tablet (5 mg total) by mouth daily.  . metFORMIN (GLUCOPHAGE) 1000 MG tablet TAKE 1 TABLET BY MOUTH EVERY DAY WITH BREAKFAST  . montelukast (SINGULAIR) 10 MG tablet Take 1 tablet (10 mg total) by mouth at bedtime. For sinus pain.  . pravastatin (PRAVACHOL) 20 MG tablet Take 1 tablet (20 mg total) by mouth daily.  . sildenafil (REVATIO) 20 MG tablet Take 1-3 tablets  (20-60 mg total) by mouth daily as needed (sex).   No facility-administered encounter medications on file as of 08/13/2019.    Activities of Daily Living In your present state of health, do you have any difficulty performing the following activities: 08/13/2019  Hearing? N  Vision? N  Difficulty concentrating or making decisions? N  Walking or climbing stairs? N  Dressing or bathing? N  Doing errands, shopping? N  Preparing Food and eating ? N  Using the Toilet? N  In the past six months, have you accidently leaked urine? N  Do you have problems with loss of bowel control? N  Managing your Medications? N  Managing your Finances? N  Housekeeping or managing your Housekeeping? N  Some recent data might be hidden    Patient Care Team: [name], DO as PCP - General (Osteopathic Medicine) geoffry bond  as Consulting Physician (Glaucoma Ophthalmology)   Assessment:   This is a routine wellness examination for [place].  Exercise Activities and Dietary recommendations Current Exercise Habits: The patient has a physically strenuous job, but has no regular exercise apart from work., Exercise limited by: None identified Eats a healthy diet- does not eat out a lot. Drinks water daily. Goals    . Weight (lb) < 200 lb (90.7 kg)     Would like to loose 10lbs       Fall Risk Fall Risk  08/13/2019 08/13/2019 12/07/2016 10/09/2014 07/07/2014  Falls in the past year? 0 0 No No No  Number falls in past yr: 0 - - - -  Injury with Fall? 0 - - - -  Risk for fall due to : No Fall Risks No Fall Risks - - -  Follow up Falls prevention discussed Falls prevention discussed - - -   Is the patient's home free of loose throw rugs in walkways, pet beds, electrical cords, etc?   yes      Grab bars in the bathroom? yes      Handrails on the stairs?   no      Adequate lighting?   yes  Depression Screen PHQ 2/9 Scores 08/13/2019 08/01/2018 10/04/2017 04/05/2017  PHQ - 2 Score 0 0 0 0  PHQ- 9 Score -  - 0 0    Cognitive Function     6CIT Screen 08/13/2019  What Year? 0 points  What month? 0 points  What time? 0 points  Count back from 20 0 points  Months in reverse 0 points  Repeat phrase 0 points  Total Score 0    Immunization History  Administered Date(s) Administered  . Fluad Quad(high Dose 65+) 04/03/2019  . Influenza Split 04/17/2012  . Influenza, High Dose Seasonal PF 04/05/2017, 05/02/2018  . Influenza,inj,Quad PF,6+ Mos 04/24/2013, 03/25/2014, 04/15/2015, 03/09/2016  . Influenza-Unspecified 07/12/2011  . Pneumococcal Conjugate-13 09/01/2015  . Pneumococcal Polysaccharide-23 03/23/2012  . Tdap 04/15/2015  . Zoster 10/30/2013    Screening Tests Health Maintenance  Topic Date Due  . FOOT EXAM  12/07/2017  . OPHTHALMOLOGY EXAM  05/31/2018  . HEMOGLOBIN A1C  01/01/2020  . COLONOSCOPY  03/23/2025  . TETANUS/TDAP  04/14/2025  . INFLUENZA VACCINE  Completed  . Hepatitis C Screening  Completed  . PNA vac Low Risk Adult  Completed        Plan:    Please schedule your next medicare wellness visit with me in 1 yr.  Mr. Ferrara , Thank you for taking time to come for your Medicare Wellness Visit. I appreciate your ongoing commitment to your health goals. Please review the following plan we discussed and let me know if I can assist you in the future.   These are the goals we discussed: Goals    . Weight (lb) < 200 lb (90.7 kg)     Would like to loose 10lbs       This is a list of the screening recommended for you and due dates:  Health Maintenance  Topic Date Due  . Complete foot exam  12/07/2017  . Eye exam for diabetics  05/31/2018  . Hemoglobin A1C  01/01/2020  . Colon Cancer Screening  03/23/2025  . Tetanus Vaccine  04/14/2025  . Flu Shot  Completed  .  Hepatitis C: One time screening is recommended by Center for Disease Control  (CDC) for  adults born from [birth year] through 1965.   Completed  . Pneumonia vaccines  Completed     I have personally  reviewed and noted the following in the patient's chart:   . Medical and social history . Use of alcohol, tobacco or illicit drugs  . Current medications and supplements . Functional ability and status . Nutritional status . Physical activity . Advanced directives . List of other physicians . Hospitalizations, surgeries, and ER visits in previous 12 months . Vitals . Screenings to include cognitive, depression, and falls . Referrals and appointments  In addition, I have reviewed and discussed with patient certain preventive protocols, quality metrics, and best practice recommendations. A written personalized care plan for preventive services as well as general preventive health recommendations were provided to patient.     Joanne Chars, LPN  03/05/7516

## 2019-08-13 ENCOUNTER — Ambulatory Visit (INDEPENDENT_AMBULATORY_CARE_PROVIDER_SITE_OTHER): Payer: Medicare Other | Admitting: *Deleted

## 2019-08-13 VITALS — Ht 75.0 in | Wt 235.0 lb

## 2019-08-13 DIAGNOSIS — Z0000 Encounter for general adult medical examination without abnormal findings: Secondary | ICD-10-CM

## 2019-08-13 NOTE — Patient Instructions (Signed)
Please schedule your next medicare wellness visit with me in 1 yr.  Calvin Wise , Thank you for taking time to come for your Medicare Wellness Visit. I appreciate your ongoing commitment to your health goals. Please review the following plan we discussed and let me know if I can assist you in the future.   These are the goals we discussed: Goals    . Weight (lb) < 200 lb (90.7 kg)     Would like to loose 10lbs

## 2019-11-06 ENCOUNTER — Encounter: Payer: Self-pay | Admitting: Osteopathic Medicine

## 2019-11-06 ENCOUNTER — Ambulatory Visit (INDEPENDENT_AMBULATORY_CARE_PROVIDER_SITE_OTHER): Payer: Medicare Other | Admitting: Osteopathic Medicine

## 2019-11-06 ENCOUNTER — Other Ambulatory Visit: Payer: Self-pay

## 2019-11-06 VITALS — BP 138/76 | HR 49 | Wt 227.0 lb

## 2019-11-06 DIAGNOSIS — E1169 Type 2 diabetes mellitus with other specified complication: Secondary | ICD-10-CM | POA: Diagnosis not present

## 2019-11-06 DIAGNOSIS — Z794 Long term (current) use of insulin: Secondary | ICD-10-CM | POA: Diagnosis not present

## 2019-11-06 LAB — POCT GLYCOSYLATED HEMOGLOBIN (HGB A1C): Hemoglobin A1C: 6.2 % — AB (ref 4.0–5.6)

## 2019-11-06 MED ORDER — BYDUREON BCISE 2 MG/0.85ML ~~LOC~~ AUIJ: 2.0000 mg | AUTO-INJECTOR | SUBCUTANEOUS | 3 refills | Status: DC

## 2019-11-06 NOTE — Patient Instructions (Signed)
Will see if we can get weekly injection covered by insurance See printed Rx If we can get the Bydureon (or similar), can STOP insulin If sugars go up a bit, that's ok   Lab records attached  [place] doctor can feel free to reach out to me if needed  Please have [facility] fax records of medications and lab results to [place] at [phone]

## 2019-11-06 NOTE — Progress Notes (Signed)
Calvin Wise is a 74 y.o. male who [facility]  today, 11/06/19, seeking care for the following: . DM2 follow-up   DIABETES CARE:  Medications as of 11/06/19, --> changes made 11/06/19 - A1C good, low dose insulin, let's try to get him on an SGLT2 instead for ease of use and reduce hypoglycemia risk - pt going to the [facility] next week, see patient instructions  . Metformin: 1000 mg daily . Other antihyperglycemics: Lantus  . ACE/ARB: Lisinopril 5 mg daily o (Microalbumin needed? no) . Antiplatelet if ASCVD Risk >10%: No  The ASCVD Risk score [facility]., et al., 2013) failed to calculate for the following reasons:   The valid total cholesterol range is 130 to 320 mg/dL . Statin: Pravastatin 20 mg daily o LDL goal <70: due to recheck, last measurement was 10/09/2018  A1C:  03/09/16: 7.4.06/03/16: 11.5.07/06/16: 10.5.09/07/16: 7.0.12/07/16: 6.5.04/05/17: 6.2.07/05/17: 6.7.10/04/17: 6.5.  01/03/18: 6.5 - metformin 1000 mg daily, Lantus 13 units but some hypoglycemia as above. We reduced to 10 units.   05/02/18: 6.1 and no additional episodes of hypoglycemia on current medication.  08/01/18: 7.0%; 01/01/19: 6.8%  04/03/19: 6.7%-reports he was recently seen at the [facility] and they increased his insulin back to 12 units daily. He has not had any hypoglycemic episodes. Advised to elt one doctor manage his diabetes, if he needs to see the [facility] for physicals etc that's fine but we shouldn't have 2 doctors trying to manage the same issue.   07/03/19: 6.5% no hypoglycemia, still on 10-12 units insulin daily without any problems.   Today 11/06/19: A1C 6.2%  BP goal <130/80:  BP Readings from Last 3 Encounters:  11/06/19 138/76  07/03/19 133/69  04/03/19 (!) 163/57    Wt Readings from Last 3 Encounters:  11/06/19 227 lb (103 kg)  08/13/19 235 lb (106.6 kg)  07/03/19 241 lb (109.3 kg)     Eye exam annually: Yes  , importance discussed with patient Foot exam: needs   Pneumovax:: done Immunization History  Administered Date(s) Administered  . Fluad Quad(high Dose 65+) 04/03/2019  . Influenza Split 04/17/2012  . Influenza, High Dose Seasonal PF 04/05/2017, 05/02/2018  . Influenza,inj,Quad PF,6+ Mos 04/24/2013, 03/25/2014, 04/15/2015, 03/09/2016  . Influenza-Unspecified 07/12/2011  . Moderna SARS-COVID-2 Vaccination 08/31/2019, 09/28/2019  . Pneumococcal Conjugate-13 09/01/2015  . Pneumococcal Polysaccharide-23 03/23/2012  . Tdap 04/15/2015  . Zoster 10/30/2013       ASSESSMENT & PLAN with other pertinent history/findings:  The encounter diagnosis was Type 2 diabetes mellitus with other specified complication, with long-term current use of insulin ([facility]).  Changes made 11/06/19 - A1C good, low dose insulin, let's try to get him on an SGLT2 instead for ease of use and reduce hypoglycemia risk - pt going to the [facility] next week, see patient instructions   Patient Instructions  Will see if we can get weekly injection covered by insurance See printed Rx If we can get the Bydureon (or similar), can STOP insulin If sugars go up a bit, that's ok   Lab records attached  [facility] doctor can feel free to reach out to me if needed  Please have [facility] fax records of medications and lab results to [place] at [phone]     Orders Placed This Encounter  Procedures  . POCT HgB A1C    Meds ordered this encounter  Medications  . Exenatide ER (BYDUREON BCISE) 2 MG/0.85ML AUIJ    Sig:  Inject 2 mg into the skin once a week.    Dispense:  4 pen    Refill:  3       Follow-up instructions: Return in about 3 months (around 02/05/2020) for recheck diabetes, may get labs done.                                         BP 138/76 (BP Location: Left Arm, Patient Position: Sitting, Cuff Size: Large)   Pulse (!) 49   Wt 227 lb (103 kg)   SpO2 99%   BMI 28.37 kg/m    Current Meds  Medication Sig  . Blood Glucose Monitoring Suppl (ACCU-CHEK GUIDE) w/Device KIT Inject 1 Stick into the skin 2 (two) times daily with breakfast and lunch. Dx E11.9 DM  . cetirizine (ZYRTEC) 10 MG tablet Take 10 mg by mouth daily.  . Cholecalciferol (VITAMIN D3) 125 MCG (5000 UT) CAPS Take by mouth.  . co-enzyme Q-10 30 MG capsule Take 1 capsule (30 mg total) by mouth daily.  . dorzolamide-timolol (COSOPT) 22.3-6.8 MG/ML ophthalmic solution Place 1 drop into the right eye 2 times daily.  Marland Kitchen glucose blood (ACCU-CHEK GUIDE) test strip USE AS INSTRUCTED  . insulin glargine (LANTUS) 100 UNIT/ML injection Inject 0.12 mLs (12 Units total) into the skin at bedtime.  . Lancets MISC For use with glucometer qid as directed  . lisinopril (PRINIVIL,ZESTRIL) 5 MG tablet Take 1 tablet (5 mg total) by mouth daily.  . metFORMIN (GLUCOPHAGE) 1000 MG tablet TAKE 1 TABLET BY MOUTH EVERY DAY WITH BREAKFAST  . montelukast (SINGULAIR) 10 MG tablet Take 1 tablet (10 mg total) by mouth at bedtime. For sinus pain.  . pravastatin (PRAVACHOL) 20 MG tablet Take 1 tablet (20 mg total) by mouth daily.  . sildenafil (REVATIO) 20 MG tablet Take 1-3 tablets (20-60 mg total) by mouth daily as needed (sex).    Results for orders placed or performed in visit on 11/06/19 (from the past 72 hour(s))  POCT HgB A1C     Status: Abnormal   Collection Time: 11/06/19  9:13 AM  Result Value Ref Range   Hemoglobin A1C 6.2 (A) 4.0 - 5.6 %   HbA1c POC (<> result, manual entry)     HbA1c, POC (prediabetic range)     HbA1c, POC (controlled diabetic range)      No results found.  Depression screen [facility] 2/9 08/13/2019 08/01/2018 10/04/2017  Decreased Interest 0 0 0  Down, Depressed, Hopeless 0 0 0  PHQ - 2 Score 0 0 0  Altered sleeping - - 0  Tired, decreased energy - - 0  Change in appetite - - 0  Feeling bad or failure about yourself  - - 0  Trouble concentrating - - 0  Moving slowly or fidgety/restless - - 0   Suicidal thoughts - - 0  PHQ-9 Score - - 0    All questions at time of visit were answered - patient instructed to contact office with any additional concerns or updates.  ER/RTC precautions were reviewed with the patient.  Please note: voice recognition software was used to produce this document, and typos may escape review. Please contact Dr. [name] for any needed clarifications.    Total 30 mins spent

## 2019-11-13 LAB — HEPATIC FUNCTION PANEL
ALT: 20 (ref 10–40)
AST: 17 (ref 14–40)
Alkaline Phosphatase: 58 (ref 25–125)
Bilirubin, Direct: 0.3 (ref 0.01–0.4)
Bilirubin, Total: 1

## 2019-11-13 LAB — HEMOGLOBIN A1C: Hemoglobin A1C: 6.5

## 2019-11-13 LAB — BASIC METABOLIC PANEL
BUN: 15 (ref 4–21)
CO2: 32 — AB (ref 13–22)
Chloride: 104 (ref 99–108)
Creatinine: 1 (ref 0.6–1.3)
Glucose: 99
Potassium: 4 (ref 3.4–5.3)
Sodium: 138 (ref 137–147)

## 2019-11-13 LAB — COMPREHENSIVE METABOLIC PANEL
Albumin: 3.9 (ref 3.5–5.0)
Calcium: 9.4 (ref 8.7–10.7)
GFR calc Af Amer: >60

## 2019-11-13 LAB — LIPID PANEL
Cholesterol: 82 (ref 0–200)
HDL: 49 (ref 35–70)
LDL Cholesterol: 21
Triglycerides: 62 (ref 40–160)

## 2019-11-13 LAB — CBC AND DIFFERENTIAL
HCT: 41 (ref 41–53)
Hemoglobin: 12.9 — AB (ref 13.5–17.5)
Platelets: 135 — AB (ref 150–399)
WBC: 5.2

## 2019-11-13 LAB — CBC: RBC: 4.75 (ref 3.87–5.11)

## 2019-11-13 LAB — TSH: TSH: 1.98 (ref 0.41–5.90)

## 2019-11-13 LAB — VITAMIN D 25 HYDROXY (VIT D DEFICIENCY, FRACTURES): Vit D, 25-Hydroxy: 30.82

## 2019-12-03 DIAGNOSIS — H401133 Primary open-angle glaucoma, bilateral, severe stage: Secondary | ICD-10-CM | POA: Diagnosis not present

## 2019-12-03 DIAGNOSIS — Z961 Presence of intraocular lens: Secondary | ICD-10-CM | POA: Diagnosis not present

## 2019-12-04 LAB — IRON,TIBC AND FERRITIN PANEL
Ferritin: 271.2
Iron Saturation: 16.8
TIBC: 333
Transferrin: 238

## 2019-12-04 LAB — URINALYSIS, COMPLETE
Bilirubin (Urine): NEGATIVE
Blood: NEGATIVE
Glucose, Ur: NEGATIVE
Ketone: NEGATIVE
Nitrite, UA: NEGATIVE
Protein: NEGATIVE
Specific Gravity: 1.026
Urobilinogen, Ur: 2
WBC, UA: NEGATIVE
pH: 5

## 2019-12-04 LAB — CBC: RBC: 4.65 (ref 3.87–5.11)

## 2019-12-04 LAB — CBC AND DIFFERENTIAL
HCT: 40 — AB (ref 41–53)
Hemoglobin: 12.9 — AB (ref 13.5–17.5)
Platelets: 159 (ref 150–399)
WBC: 5.4

## 2019-12-04 LAB — VITAMIN B12: Vitamin B-12: 408

## 2019-12-04 LAB — FOLATE: Folate: 12.8

## 2019-12-18 ENCOUNTER — Other Ambulatory Visit: Payer: Self-pay

## 2019-12-18 ENCOUNTER — Ambulatory Visit (INDEPENDENT_AMBULATORY_CARE_PROVIDER_SITE_OTHER): Payer: Medicare Other | Admitting: Osteopathic Medicine

## 2019-12-18 ENCOUNTER — Encounter: Payer: Self-pay | Admitting: Osteopathic Medicine

## 2019-12-18 VITALS — BP 135/72 | HR 53 | Temp 98.3°F | Wt 230.0 lb

## 2019-12-18 DIAGNOSIS — D649 Anemia, unspecified: Secondary | ICD-10-CM | POA: Diagnosis not present

## 2019-12-18 NOTE — Progress Notes (Signed)
Calvin Wise is a 74 y.o. male who presents to  [place] at [facility]  today, 12/18/19, seeking care for the following: . Concern for anemia - [facility], recommended GI consult for scope, pt declined, wanted to check w/ me about all this. No symptoms of fatigue, no blood in stool or other easy bleeding, no dizziness     ASSESSMENT & PLAN with other pertinent history/findings:  The encounter diagnosis was Anemia, unspecified type.  Hgb 12.9 x1 1 month apart - see scanned docs from [place]  Ferritin normal    Patient Instructions  Colonoscopy is standard of care in patients over 76 years old who have unexplained anemia or iron deficient anemia that's not improving with iron supplements. Reason is - people can have a very slow GI bleed somewhere in the stomach or intestines that would NOT show obviously bloody stools or abdominal pain, so we would need to look with a scope or camera.   Let's repeat the labs today and look for other reasons for anemia.   If no cause for anemia can be found, we can   1. Schedule colonoscopy to evaluate for bleeding  If you decide NOT to do this, we may be missing a GI bleed  If that GI bleed gets worse, you may end up needing blood transfusions, suffer severe bleeding, or other problems which may include serious illness or death!    OR  2. Monitor labs   If anemia gets worse, we would ABSOLUTELY need to do a colonoscopy        Orders Placed This Encounter  Procedures  . COMPLETE METABOLIC PANEL WITH GFR  . Reticulocytes  . TSH  . Fe+TIBC+Fer  . CBC (INCLUDES DIFF/PLT) WITH PATHOLOGIST REVIEW    No orders of the defined types were placed in this encounter.      Follow-up instructions: Return for RECHECK PENDING RESULTS / IF WORSE OR CHANGE.                                         BP 135/72 (BP Location: Left Arm, Patient Position:  Sitting, Cuff Size: Normal)   Pulse (!) 53   Temp 98.3 F (36.8 C) (Oral)   Wt 230 lb 0.6 oz (104.3 kg)   BMI 28.75 kg/m   Current Meds  Medication Sig  . Blood Glucose Monitoring Suppl (ACCU-CHEK GUIDE) w/Device KIT Inject 1 Stick into the skin 2 (two) times daily with breakfast and lunch. Dx E11.9 DM  . cetirizine (ZYRTEC) 10 MG tablet Take 10 mg by mouth daily.  . Cholecalciferol (VITAMIN D3) 125 MCG (5000 UT) CAPS Take by mouth.  . co-enzyme Q-10 30 MG capsule Take 1 capsule (30 mg total) by mouth daily.  . dorzolamide-timolol (COSOPT) 22.3-6.8 MG/ML ophthalmic solution Place 1 drop into the right eye 2 times daily.  . Exenatide ER (BYDUREON BCISE) 2 MG/0.85ML AUIJ Inject 2 mg into the skin once a week.  Marland Kitchen glucose blood (ACCU-CHEK GUIDE) test strip USE AS INSTRUCTED  . insulin glargine (LANTUS) 100 UNIT/ML injection Inject 0.12 mLs (12 Units total) into the skin at bedtime.  . Lancets MISC For use with glucometer qid as directed  . lisinopril (PRINIVIL,ZESTRIL) 5 MG tablet Take 1 tablet (5 mg total) by mouth daily.  . metFORMIN (GLUCOPHAGE) 1000 MG tablet TAKE 1 TABLET BY MOUTH  EVERY DAY WITH BREAKFAST  . montelukast (SINGULAIR) 10 MG tablet Take 1 tablet (10 mg total) by mouth at bedtime. For sinus pain.  . pravastatin (PRAVACHOL) 20 MG tablet Take 1 tablet (20 mg total) by mouth daily.  . sildenafil (REVATIO) 20 MG tablet Take 1-3 tablets (20-60 mg total) by mouth daily as needed (sex).    No results found for this or any previous visit (from the past 72 hour(s)).  No results found.  Depression screen [facility] 2/9 08/13/2019 08/01/2018 10/04/2017  Decreased Interest 0 0 0  Down, Depressed, Hopeless 0 0 0  PHQ - 2 Score 0 0 0  Altered sleeping - - 0  Tired, decreased energy - - 0  Change in appetite - - 0  Feeling bad or failure about yourself  - - 0  Trouble concentrating - - 0  Moving slowly or fidgety/restless - - 0  Suicidal thoughts - - 0  PHQ-9 Score - - 0    GAD 7 :  Generalized Anxiety Score 08/01/2018 10/04/2017  Nervous, Anxious, on Edge 0 0  Control/stop worrying 0 0  Worry too much - different things 0 0  Trouble relaxing 0 0  Restless 0 0  Easily annoyed or irritable 0 0  Afraid - awful might happen 0 0  Total GAD 7 Score 0 0  Anxiety Difficulty Not difficult at all -      All questions at time of visit were answered - patient instructed to contact office with any additional concerns or updates.  ER/RTC precautions were reviewed with the patient.  Please note: voice recognition software was used to produce this document, and typos may escape review. Please contact Dr. [name] for any needed clarifications.

## 2019-12-18 NOTE — Patient Instructions (Addendum)
Colonoscopy is standard of care in patients over 74 years old who have unexplained anemia or iron deficient anemia that's not improving with iron supplements. Reason is - people can have a very slow GI bleed somewhere in the stomach or intestines that would NOT show obviously bloody stools or abdominal pain, so we would need to look with a scope or camera.   Let's repeat the labs today and look for other reasons for anemia.   If no cause for anemia can be found, we can   1. Schedule colonoscopy to evaluate for bleeding  If you decide NOT to do this, we may be missing a GI bleed  If that GI bleed gets worse, you may end up needing blood transfusions, suffer severe bleeding, or other problems which may include serious illness or death!    OR  2. Monitor labs   If anemia gets worse, we would ABSOLUTELY need to do a colonoscopy

## 2019-12-24 LAB — CBC (INCLUDES DIFF/PLT) WITH PATHOLOGIST REVIEW
Absolute Monocytes: 541 cells/uL (ref 200–950)
Basophils Absolute: 21 cells/uL (ref 0–200)
Basophils Relative: 0.4 %
Eosinophils Absolute: 42 cells/uL (ref 15–500)
Eosinophils Relative: 0.8 %
HCT: 39.6 % (ref 38.5–50.0)
Hemoglobin: 12.8 g/dL — ABNORMAL LOW (ref 13.2–17.1)
Lymphs Abs: 1399 cells/uL (ref 850–3900)
MCH: 27.9 pg (ref 27.0–33.0)
MCHC: 32.3 g/dL (ref 32.0–36.0)
MCV: 86.3 fL (ref 80.0–100.0)
MPV: 10.9 fL (ref 7.5–12.5)
Monocytes Relative: 10.4 %
Neutro Abs: 3198 cells/uL (ref 1500–7800)
Neutrophils Relative %: 61.5 %
Platelets: 141 10*3/uL (ref 140–400)
RBC: 4.59 10*6/uL (ref 4.20–5.80)
RDW: 13.8 % (ref 11.0–15.0)
Total Lymphocyte: 26.9 %
WBC: 5.2 10*3/uL (ref 3.8–10.8)

## 2019-12-24 LAB — COMPLETE METABOLIC PANEL WITH GFR
AG Ratio: 1.8 (calc) (ref 1.0–2.5)
ALT: 13 U/L (ref 9–46)
AST: 19 U/L (ref 10–35)
Albumin: 4.4 g/dL (ref 3.6–5.1)
Alkaline phosphatase (APISO): 50 U/L (ref 35–144)
BUN/Creatinine Ratio: 13 (calc) (ref 6–22)
BUN: 16 mg/dL (ref 7–25)
CO2: 31 mmol/L (ref 20–32)
Calcium: 9.5 mg/dL (ref 8.6–10.3)
Chloride: 103 mmol/L (ref 98–110)
Creat: 1.2 mg/dL — ABNORMAL HIGH (ref 0.70–1.18)
GFR, Est African American: 69 mL/min/{1.73_m2} (ref >=60)
GFR, Est Non African American: 60 mL/min/{1.73_m2} (ref >=60)
Globulin: 2.5 g/dL (calc) (ref 1.9–3.7)
Glucose, Bld: 125 mg/dL (ref 65–139)
Potassium: 3.9 mmol/L (ref 3.5–5.3)
Sodium: 139 mmol/L (ref 135–146)
Total Bilirubin: 1 mg/dL (ref 0.2–1.2)
Total Protein: 6.9 g/dL (ref 6.1–8.1)

## 2019-12-24 LAB — IRON,TIBC AND FERRITIN PANEL
%SAT: 30 % (calc) (ref 20–48)
Ferritin: 210 ng/mL (ref 24–380)
Iron: 100 ug/dL (ref 50–180)
TIBC: 333 mcg/dL (calc) (ref 250–425)

## 2019-12-24 LAB — RETICULOCYTES
ABS Retic: 64260 cells/uL (ref 25000–9000)
Retic Ct Pct: 1.4 %

## 2019-12-24 LAB — TSH: TSH: 2.1 mIU/L (ref 0.40–4.50)

## 2020-02-05 ENCOUNTER — Ambulatory Visit: Payer: Medicare Other | Admitting: Osteopathic Medicine

## 2020-03-11 ENCOUNTER — Other Ambulatory Visit: Payer: Self-pay

## 2020-03-11 ENCOUNTER — Encounter: Payer: Self-pay | Admitting: Osteopathic Medicine

## 2020-03-11 ENCOUNTER — Ambulatory Visit (INDEPENDENT_AMBULATORY_CARE_PROVIDER_SITE_OTHER): Payer: Medicare Other | Admitting: Osteopathic Medicine

## 2020-03-11 ENCOUNTER — Ambulatory Visit: Payer: Medicare Other | Admitting: Osteopathic Medicine

## 2020-03-11 VITALS — BP 121/74 | HR 54 | Temp 98.2°F | Wt 230.1 lb

## 2020-03-11 DIAGNOSIS — E11649 Type 2 diabetes mellitus with hypoglycemia without coma: Secondary | ICD-10-CM

## 2020-03-11 DIAGNOSIS — Z794 Long term (current) use of insulin: Secondary | ICD-10-CM | POA: Diagnosis not present

## 2020-03-11 LAB — POCT GLYCOSYLATED HEMOGLOBIN (HGB A1C): Hemoglobin A1C: 7.4 % — AB (ref 4.0–5.6)

## 2020-03-11 NOTE — Progress Notes (Signed)
Calvin Wise is a 74 y.o. male who presents to  [place] at [facility]  today, 03/11/20, seeking care for the following:   DM2 follow-up - A1C increased a bit from last but still at goal of 8.0 or less.   Medications   Metformin: 1000 mg daily  Other antihyperglycemics: Lantus 12 units qhs, Bydureon 2 mg weekly    ACE/ARB: Lisinopril 5 mg daily ? (Microalbumin needed? no)  Antiplatelet if ASCVD Risk >10%: No   Statin: Pravastatin 20 mg daily ? LDL goal <70: due to recheck, last measurement was 10/09/2018  A1C:  07/03/19: 6.5% no hypoglycemia, still on 10-12 units insulin daily without any problems.  11/06/19: A1C 6.2%  Today 03/11/20 A1C 7.4%     ASSESSMENT & PLAN with other pertinent findings:  The encounter diagnosis was Type 2 diabetes mellitus with hypoglycemia without coma, with long-term current use of insulin ([place]).   Results for orders placed or performed in visit on 03/11/20 (from the past 24 hour(s))  POCT HgB A1C     Status: Abnormal   Collection Time: 03/11/20  2:58 PM  Result Value Ref Range   Hemoglobin A1C 7.4 (A) 4.0 - 5.6 %   HbA1c POC (<> result, manual entry)     HbA1c, POC (prediabetic range)     HbA1c, POC (controlled diabetic range)      There are no Patient Instructions on file for this visit.  Orders Placed This Encounter  Procedures   POCT HgB A1C    No orders of the defined types were placed in this encounter.      Follow-up instructions: Return in about 4 months (around 07/11/2020) for MONTIOR A1C.                                         There were no vitals taken for this visit.  No outpatient medications have been marked as taking for the 03/11/20 encounter (Office Visit) with [name], DO.    No results found for this or any previous visit (from the past 72 hour(s)).  No results found.     All questions at time of  visit were answered - patient instructed to contact office with any additional concerns or updates.  ER/RTC precautions were reviewed with the patient as applicable.   Please note: voice recognition software was used to produce this document, and typos may escape review. Please contact Dr. [name] for any needed clarifications.

## 2020-03-18 ENCOUNTER — Other Ambulatory Visit: Payer: Self-pay | Admitting: Osteopathic Medicine

## 2020-03-18 DIAGNOSIS — E1169 Type 2 diabetes mellitus with other specified complication: Secondary | ICD-10-CM

## 2020-04-07 DIAGNOSIS — H35033 Hypertensive retinopathy, bilateral: Secondary | ICD-10-CM | POA: Diagnosis not present

## 2020-04-07 DIAGNOSIS — H401133 Primary open-angle glaucoma, bilateral, severe stage: Secondary | ICD-10-CM | POA: Diagnosis not present

## 2020-06-24 DIAGNOSIS — C8580 Other specified types of non-Hodgkin lymphoma, unspecified site: Secondary | ICD-10-CM | POA: Insufficient documentation

## 2020-06-24 DIAGNOSIS — C8339 Diffuse large B-cell lymphoma, extranodal and solid organ sites: Secondary | ICD-10-CM | POA: Insufficient documentation

## 2020-07-08 ENCOUNTER — Ambulatory Visit: Payer: Medicare Other | Admitting: Osteopathic Medicine

## 2020-07-10 ENCOUNTER — Encounter: Payer: Self-pay | Admitting: Osteopathic Medicine

## 2020-07-10 ENCOUNTER — Ambulatory Visit (INDEPENDENT_AMBULATORY_CARE_PROVIDER_SITE_OTHER): Payer: Medicare Other | Admitting: Osteopathic Medicine

## 2020-07-10 ENCOUNTER — Other Ambulatory Visit: Payer: Self-pay

## 2020-07-10 VITALS — BP 144/77 | HR 57 | Temp 98.1°F | Wt 234.0 lb

## 2020-07-10 DIAGNOSIS — Z794 Long term (current) use of insulin: Secondary | ICD-10-CM | POA: Diagnosis not present

## 2020-07-10 DIAGNOSIS — E1169 Type 2 diabetes mellitus with other specified complication: Secondary | ICD-10-CM | POA: Diagnosis not present

## 2020-07-10 LAB — POCT GLYCOSYLATED HEMOGLOBIN (HGB A1C): Hemoglobin A1C: 6.4 % — AB (ref 4.0–5.6)

## 2020-07-10 NOTE — Progress Notes (Signed)
Calvin Wise is a 74 y.o. male who presents to  [place] at [facility]  today, 07/10/20, seeking care for the following:  DM2 follow-up -  Medications   Metformin: 1000 mg daily  Other antihyperglycemics: Lantus 12 units qhs, Bydureon 2 mg weekly    ACE/ARB: Lisinopril 5 mg daily ? (Microalbumin needed? no)  Antiplatelet if ASCVD Risk >10%: No   Statin: Pravastatin 20 mg daily ? LDL goal <70: last measurement was at 21 in 10/2019 A1C:  07/03/19: 6.5% no hypoglycemia, still on 10-12 units insulin daily without any problems.  11/06/19: A1C 6.2%  03/11/20 A1C 7.4%   Today 07/10/20: 6.4%   Of note - started chemo yesterday for B cell lymphoma, advised that Glc may increase, he is monitoring his levels at home    [place] with other pertinent findings:  The encounter diagnosis was Type 2 diabetes mellitus with other specified complication, with long-term current use of insulin ([place]).   Results for orders placed or performed in visit on 07/10/20 (from the past 24 hour(s))  POCT HgB A1C     Status: Abnormal   Collection Time: 07/10/20 11:40 AM  Result Value Ref Range   Hemoglobin A1C 6.4 (A) 4.0 - 5.6 %   HbA1c POC (<> result, manual entry)     HbA1c, POC (prediabetic range)     HbA1c, POC (controlled diabetic range)      There are no Patient Instructions on file for this visit.  Orders Placed This Encounter  Procedures  . POCT HgB A1C    No orders of the defined types were placed in this encounter.      Follow-up instructions: Return in about 3 months (around 10/08/2020) for IN-OFFICE VISIT MONITOR A1C, SEE [place] SOONER IF NEEDED.                                         BP (!) 144/77 (BP Location: Left Arm, Patient Position: Sitting, Cuff Size: Large)   Pulse (!) 57   Temp 98.1 F (36.7 C) (Oral)   Wt 234 lb 0.6 oz (106.2 kg)   BMI 29.25 kg/m   Current Meds   Medication Sig  . Blood Glucose Monitoring Suppl (ACCU-CHEK GUIDE) w/Device KIT Inject 1 Stick into the skin 2 (two) times daily with breakfast and lunch. Dx E11.9 DM  . cetirizine (ZYRTEC) 10 MG tablet Take 10 mg by mouth daily.  . Cholecalciferol (VITAMIN D3) 125 MCG (5000 UT) CAPS Take by mouth.  . co-enzyme Q-10 30 MG capsule Take 1 capsule (30 mg total) by mouth daily.  . dorzolamide-timolol (COSOPT) 22.3-6.8 MG/ML ophthalmic solution Place 1 drop into the right eye 2 times daily.  . Exenatide ER (BYDUREON BCISE) 2 MG/0.85ML AUIJ Inject 2 mg into the skin once a week.  Marland Kitchen glucose blood (ACCU-CHEK GUIDE) test strip USE AS INSTRUCTED  . insulin glargine (LANTUS) 100 UNIT/ML injection Inject 0.12 mLs (12 Units total) into the skin at bedtime.  . Lancets MISC For use with glucometer qid as directed  . lisinopril (PRINIVIL,ZESTRIL) 5 MG tablet Take 1 tablet (5 mg total) by mouth daily.  . metFORMIN (GLUCOPHAGE) 1000 MG tablet TAKE 1 TABLET BY MOUTH EVERY DAY WITH BREAKFAST  . montelukast (SINGULAIR) 10 MG tablet Take 1 tablet (10 mg total) by mouth at bedtime. For sinus pain.  . pravastatin (  PRAVACHOL) 20 MG tablet Take 1 tablet (20 mg total) by mouth daily.  . sildenafil (REVATIO) 20 MG tablet Take 1-3 tablets (20-60 mg total) by mouth daily as needed (sex).    Results for orders placed or performed in visit on 07/10/20 (from the past 72 hour(s))  POCT HgB A1C     Status: Abnormal   Collection Time: 07/10/20 11:40 AM  Result Value Ref Range   Hemoglobin A1C 6.4 (A) 4.0 - 5.6 %   HbA1c POC (<> result, manual entry)     HbA1c, POC (prediabetic range)     HbA1c, POC (controlled diabetic range)      No results found.     All questions at time of visit were answered - patient instructed to contact office with any additional concerns or updates.  ER/RTC precautions were reviewed with the patient as applicable.   Please note: voice recognition software was used to produce this document,  and typos may escape review. Please contact Dr. [name] for any needed clarifications.

## 2020-07-12 ENCOUNTER — Other Ambulatory Visit: Payer: Self-pay | Admitting: Osteopathic Medicine

## 2020-08-04 ENCOUNTER — Telehealth: Payer: Self-pay

## 2020-08-04 NOTE — Telephone Encounter (Signed)
FYI -   Pt called earlier today stating that his blood sugar has been elevated since starting chemotherapy. Per pt, his blood sugar was 561. Patient did contact the hematologist office and was seen at the [place].  Per hematologist RN msg today:  "Calvin Wise just called. He says his BG was 561 yesterday. He called the [place] and they told him to take 3 additional units of Insulin. He says it then went down to 145. However, he has been feeling "dehydrated". He says he went to the [place] today and they just gave him insulin, but they didn't check labs or urine. He reports experiencing a slight headache and some blurry vision. He also reports peeing very frequently.   Do you want me to ask him to come in for labs and possible IV hydration?"

## 2020-08-05 NOTE — Telephone Encounter (Signed)
Noted. He needs visit if any further issues

## 2020-08-05 NOTE — Telephone Encounter (Signed)
I mean he's symptomatic, he needs to be seen  If HemOnc can fit him in ASAP, great If not, he can be put on my schedule for tomorrow

## 2020-08-05 NOTE — Telephone Encounter (Signed)
FYI - updated note from Hematologist office:  Patient called b/c he noticed elevated BG for the past several days, which seemed to have worsened especially today. Of note, he is on prednisone 75mg  daily x 5 days that he completed today.  He noticed that his BG has been ranging in 200s, but increased up to 480 yesterday in the setting of steroids. He has been taking metformin 1000 mg BID and lantus 22 units nightly. He took 22 units of lantus last night, and AM BG was 169. Throughout the day he felt a bit more tired and felt like his sugar has been high w/ blurry vision, which seems to have resolved prior to call.   He just checked his BG at 1800 which was 554. He reports eating supper prior to checking his BG. He administered 30 units of lantus, and repeat BG at 1920 was 541. He does not have any short acting insulin at hand. Advised not to further uptitrate or administer more long-acting insulin.  Discussed that if his BG does not improve, he would need to go to the ED to receive short-acting insulin and IV fluids. He is hydrating with water at home and would prefer to stay home to manage if able. Shared decision made to repeat BG in 1.5 hour and monitor closely. He endorses polyuria, but otherwise feels well. He currently denies n/v, abdominal pain, worsening vision or headache, polydipsia, confusion. Reviewed both hypo- and hyper-glycemic symptoms. Advised to monitor closely at home.  Update: BG 434 ~ 2100. BG 390 ~2200. Patient reports feeling improved overall without concerns. Advised to check fasting BG in the morning and seeing PCP. Patient is planning to see his PCP at [facility] in the morning for further glycemic control.

## 2020-08-06 ENCOUNTER — Telehealth: Payer: Self-pay

## 2020-08-06 NOTE — Telephone Encounter (Signed)
High glc expected w/ chemo OK to increase lantus to 14 units at night  OK to take 10 units Novolin with smaller meals, 15 units w/ larger dinner Keep appt  No other recs w/o visit

## 2020-08-06 NOTE — Telephone Encounter (Signed)
Patient advised.

## 2020-08-06 NOTE — Telephone Encounter (Signed)
Calvin Wise called and states his blood sugars are not controlled. He states he wants to speak with Dr [name]. He has an appointment on Tuesday.   He is wanting an increase in his Lantus. He is now on prednisone.   He takes Novolin on a sliding scale and Lantus 12 units at night  >150-200 4 units, >201-250 8 units and >251-300 10 units  Fasting glucose Tuesday 163 mg/ml Wednesday 156 mg/ml Thursday 194 mg/ml

## 2020-08-11 ENCOUNTER — Ambulatory Visit: Payer: Medicare Other | Admitting: Osteopathic Medicine

## 2020-08-12 DIAGNOSIS — Z79899 Other long term (current) drug therapy: Secondary | ICD-10-CM | POA: Diagnosis not present

## 2020-08-12 DIAGNOSIS — H401133 Primary open-angle glaucoma, bilateral, severe stage: Secondary | ICD-10-CM | POA: Diagnosis not present

## 2020-08-12 DIAGNOSIS — H35033 Hypertensive retinopathy, bilateral: Secondary | ICD-10-CM | POA: Diagnosis not present

## 2020-08-17 ENCOUNTER — Ambulatory Visit (INDEPENDENT_AMBULATORY_CARE_PROVIDER_SITE_OTHER): Payer: Medicare Other | Admitting: Medical-Surgical

## 2020-08-17 DIAGNOSIS — Z0000 Encounter for general adult medical examination without abnormal findings: Secondary | ICD-10-CM | POA: Diagnosis not present

## 2020-08-17 NOTE — Progress Notes (Signed)
MEDICARE ANNUAL WELLNESS VISIT  08/17/2020  Telephone Visit Disclaimer This Medicare AWV was conducted by telephone due to national recommendations for restrictions regarding the COVID-19 Pandemic (e.g. social distancing).  I verified, using two identifiers, that I am speaking with Calvin Wise or their authorized healthcare agent. I discussed the limitations, risks, security, and privacy concerns of performing an evaluation and management service by telephone and the potential availability of an in-person appointment in the future. The patient expressed understanding and agreed to proceed.  Location of Patient: Home Location of Provider (nurse):  In the office.  Subjective:    Calvin Wise is a 75 y.o. male patient of Calvin Reeve, DO who had a Medicare Annual Wellness Visit today via telephone. Calvin Wise is Working full time at [employer] (currently he is out due to chemo) and lives with their spouse. he has 3 children. he reports that he is socially active and does interact with friends/family regularly. he is minimally physically active and enjoys working on cars.  Patient Care Team: Calvin Reeve, DO as PCP - General (Osteopathic Medicine) [employer]  as Consulting Physician (Glaucoma Ophthalmology)  Advanced Directives 08/17/2020 08/13/2019 10/19/2016 09/19/2014 07/07/2014  Does Patient Have a Medical Advance Directive? Yes Yes Yes No No  Type of Advance Directive Living will Living will Living will - -  Does patient want to make changes to medical advance directive? No - Patient declined No - Patient declined - - -  Copy of [place] in Chart? - - Yes - -  Would patient like information on creating a medical advance directive? - - No - Patient declined No - patient declined information Yes - Educational materials given    Hospital Utilization Over the Past 12 Months: # of hospitalizations or ER visits: 0 # of surgeries: 1 (port  placement)  Review of Systems    Patient reports that his overall health is unchanged compared to last year.  History obtained from chart review and the patient  Patient Reported Readings (BP, Pulse, CBG, Weight, etc) none  Pain Assessment Pain : No/denies pain     Current Medications & Allergies (verified) Allergies as of 08/17/2020      Reactions   Aspirin Other (See Comments)   Gi bleeding   Clindamycin Itching   Clindamycin/lincomycin    rash   Bactrim [sulfamethoxazole-trimethoprim] Itching, Rash   See progress note from Calvin Wise 07/19/2017. No obvious drug rash, questionable other allergy exposure      Medication List       Accurate as of August 17, 2020  9:28 AM. If you have any questions, ask your nurse or doctor.        Accu-Chek Guide w/Device Kit Inject 1 Stick into the skin 2 (two) times daily with breakfast and lunch. Dx E11.9 DM   bimatoprost 0.01 % Soln Commonly known as: LUMIGAN Place 1 drop into the right eye nightly.   Bydureon BCise 2 MG/0.85ML Auij Generic drug: Exenatide ER Inject 2 mg into the skin once a week.   cetirizine 10 MG tablet Commonly known as: ZYRTEC Take 10 mg by mouth daily.   co-enzyme Q-10 30 MG capsule Take 1 capsule (30 mg total) by mouth daily.   dorzolamide-timolol 22.3-6.8 MG/ML ophthalmic solution Commonly known as: COSOPT Place 1 drop into the right eye 2 times daily.   glucose blood test strip Commonly known as: Accu-Chek Guide USE AS INSTRUCTED   insulin glargine 100 UNIT/ML injection Commonly known  as: LANTUS Inject 0.12 mLs (12 Units total) into the skin at bedtime.   Lancets Misc For use with glucometer qid as directed   lisinopril 5 MG tablet Commonly known as: ZESTRIL Take 1 tablet (5 mg total) by mouth daily.   metFORMIN 1000 MG tablet Commonly known as: GLUCOPHAGE TAKE 1 TABLET BY MOUTH EVERY DAY WITH BREAKFAST   montelukast 10 MG tablet Commonly known as: SINGULAIR Take 1 tablet  (10 mg total) by mouth at bedtime. For sinus pain.   pravastatin 20 MG tablet Commonly known as: PRAVACHOL TAKE 1 TABLET BY MOUTH EVERY DAY   sildenafil 100 MG tablet Commonly known as: VIAGRA Take by mouth at bedtime.   sildenafil 20 MG tablet Commonly known as: REVATIO Take 1-3 tablets (20-60 mg total) by mouth daily as needed (sex).   Vitamin D3 125 MCG (5000 UT) Caps Take by mouth.   Ziextenzo 6 MG/0.6ML injection Generic drug: pegfilgrastim-bmez Inject into the skin.       History (reviewed): Past Medical History:  Diagnosis Date  . Diabetes mellitus without complication ([place])    Type II  . Erectile dysfunction 05/02/2012  . Glaucoma 05/02/2012  . History of GI bleed 2013   Related to ASA use  . Irregular heartbeat (Declines Workup 09/2011 [facility]) 05/02/2012  . Left rotator cuff tear 04/17/2012  . Prostate cancer [facility])    prostate   Past Surgical History:  Procedure Laterality Date  . BACK SURGERY  1997  . HAND SURGERY Bilateral 20 YEARS AGO  . HERNIA REPAIR    . PROSTATE BIOPSY    . RADIOACTIVE SEED IMPLANT N/A 09/19/2014   Procedure: RADIOACTIVE SEED IMPLANT;  Surgeon: Calvin Hughs, MD;  [facility];  Service: Urology;  Laterality: N/A;  . REFRACTIVE SURGERY    . SHOULDER ARTHROSCOPY Right 2007   x2   Family History  Problem Relation Age of Onset  . Alcoholism Father   . Cancer Cousin   . Cancer Cousin   . Cancer Cousin    Social History   Socioeconomic History  . Marital status: Married    Spouse name: Calvin Wise  . Number of children: 3  . Years of education: 86  . Highest education level: Some college, no degree  Occupational History    Comment: [employer]  Tobacco Use  . Smoking status: Former Smoker    Packs/day: 0.25    Years: 44.00    Pack years: 11.00    Types: Cigarettes    Quit date: 05/24/2007    Years since quitting: 13.2  . Smokeless tobacco: Never Used  Vaping Use  . Vaping Use: Never used   Substance and Sexual Activity  . Alcohol use: Yes    Comment: occasion  . Drug use: No  . Sexual activity: Yes  Other Topics Concern  . Not on file  Social History Narrative   rks at [employer] full time in [place].   Social Determinants of Health   Financial Resource Strain: Low Risk   . Difficulty of Paying Living Expenses: Not hard at all  Food Insecurity: No Food Insecurity  . Worried About Charity fundraiser in the Last Year: Never true  . Ran Out of Food in the Last Year: Never true  Transportation Needs: No Transportation Needs  . Lack of Transportation (Medical): No  . Lack of Transportation (Non-Medical): No  Physical Activity: Inactive  . Days of Exercise per Week: 0 days  . Minutes  of Exercise per Session: 0 min  Stress: No Stress Concern Present  . Feeling of Stress : Not at all  Social Connections: Moderately Isolated  . Frequency of Communication with Friends and Family: More than three times a week  . Frequency of Social Gatherings with Friends and Family: Never  . Attends Religious Services: Never  . Active Member of Clubs or Organizations: No  . Attends Archivist Meetings: Never  . Marital Status: Married    Activities of Daily Living In your present state of health, do you have any difficulty performing the following activities: 08/17/2020  Hearing? N  Vision? N  Difficulty concentrating or making decisions? N  Walking or climbing stairs? N  Dressing or bathing? N  Doing errands, shopping? N  Preparing Food and eating ? N  Using the Toilet? N  In the past six months, have you accidently leaked urine? N  Do you have problems with loss of bowel control? N  Managing your Medications? N  Managing your Finances? N  Housekeeping or managing your Housekeeping? N  Some recent data might be hidden    Patient Education/ Literacy How often do you need to have someone help you when you read instructions, pamphlets, or other written  materials from your doctor or pharmacy?: 1 - Never What is the last grade level you completed in school?: Associates Degree  Exercise Current Exercise Habits: The patient does not participate in regular exercise at present, Exercise limited by: None identified  Diet Patient reports consuming 3 meals a day and 2 snack(s) a day Patient reports that his primary diet is: Regular Patient reports that she does have regular access to food.   Depression Screen PHQ 2/9 Scores 08/17/2020 08/13/2019 08/01/2018 10/04/2017 04/05/2017 12/07/2016 10/12/2016  PHQ - 2 Score 0 0 0 0 0 0 1  PHQ- 9 Score 0 - - 0 0 0 -     Fall Risk Fall Risk  08/17/2020 08/13/2019 08/13/2019 12/07/2016 10/09/2014  Falls in the past year? 0 0 0 No No  Number falls in past yr: 0 0 - - -  Injury with Fall? 0 0 - - -  Risk for fall due to : No Fall Risks No Fall Risks No Fall Risks - -  Follow up Falls evaluation completed Falls prevention discussed Falls prevention discussed - -     Objective:  Calvin Wise seemed alert and oriented and he participated appropriately during our telephone visit.  Blood Pressure Weight BMI  BP Readings from Last 3 Encounters:  07/10/20 (!) 144/77  03/11/20 121/74  12/18/19 135/72   Wt Readings from Last 3 Encounters:  07/10/20 234 lb 0.6 oz (106.2 kg)  03/11/20 230 lb 1.9 oz (104.4 kg)  12/18/19 230 lb 0.6 oz (104.3 kg)   BMI Readings from Last 1 Encounters:  07/10/20 29.25 kg/m    *Unable to obtain current vital signs, weight, and BMI due to telephone visit type  Hearing/Vision  . [place] did not seem to have difficulty with hearing/understanding during the telephone conversation . Reports that he has had a formal eye exam by an eye care professional within the past year . Reports that he has not had a formal hearing evaluation within the past year *Unable to fully assess hearing and vision during telephone visit type  Cognitive Function: 6CIT Screen 08/17/2020 08/13/2019  What Year?  0 points 0 points  What month? 0 points 0 points  What time? 0 points 0 points  Count  back from 20 0 points 0 points  Months in reverse 0 points 0 points  Repeat phrase 0 points 0 points  Total Score 0 0   (Normal:0-7, Significant for Dysfunction: >8)  Normal Cognitive Function Screening: Yes   Immunization & Health Maintenance Record Immunization History  Administered Date(s) Administered  . Fluad Quad(high Dose 65+) 04/03/2019  . Influenza Split 04/17/2012  . Influenza, High Dose Seasonal PF 04/05/2017, 05/02/2018  . Influenza,inj,Quad PF,6+ Mos 04/24/2013, 03/25/2014, 04/15/2015, 03/09/2016  . Influenza-Unspecified 07/12/2011, 05/25/2020  . Moderna Sars-Covid-2 Vaccination 08/31/2019, 09/28/2019, 05/20/2020  . Pneumococcal Conjugate-13 09/01/2015  . Pneumococcal Polysaccharide-23 03/23/2012  . Tdap 04/15/2015  . Zoster 10/30/2013    Health Maintenance  Topic Date Due  . COVID-19 Vaccine (4 - Booster for Moderna series) 11/18/2020  . FOOT EXAM  12/22/2020  . HEMOGLOBIN A1C  01/08/2021  . OPHTHALMOLOGY EXAM  08/14/2021  . COLONOSCOPY (Pts 45-61yr Insurance coverage will need to be confirmed)  03/23/2025  . TETANUS/TDAP  04/14/2025  . INFLUENZA VACCINE  Completed  . Hepatitis C Screening  Completed  . PNA vac Low Risk Adult  Completed       Assessment  This is a routine wellness examination for WReinaldo Wise  Health Maintenance: Due or Overdue There are no preventive care reminders to display for this patient.  WReinaldo Berberdoes not need a referral for Community Assistance: Care Management:   no Social Work:    no Prescription Assistance:  no Nutrition/Diabetes Education:  no   Plan:  Personalized Goals Goals Addressed              This Visit's Progress   .  Patient Stated (pt-stated)        08/17/2020 AWV Goal: Exercise for General Health   Patient will verbalize understanding of the benefits of increased physical activity:  Exercising  regularly is important. It will improve your overall fitness, flexibility, and endurance.  Regular exercise also will improve your overall health. It can help you control your weight, reduce stress, and improve your bone density.  Over the next year, patient will increase physical activity as tolerated with a goal of at least 150 minutes of moderate physical activity per week.   You can tell that you are exercising at a moderate intensity if your heart starts beating faster and you start breathing faster but can still hold a conversation.  Moderate-intensity exercise ideas include:  Walking 1 mile (1.6 km) in about 15 minutes  Biking  Hiking  Golfing  Dancing  Water aerobics  Patient will verbalize understanding of everyday activities that increase physical activity by providing examples like the following: ? Yard work, such as: ? Pushing a lConservation officer, nature? Raking and bagging leaves ? Washing your car ? Pushing a stroller ? Shoveling snow ? Gardening ? Washing windows or floors  Patient will be able to explain general safety guidelines for exercising:   Before you start a new exercise program, talk with your health care provider.  Do not exercise so much that you hurt yourself, feel dizzy, or get very short of breath.  Wear comfortable clothes and wear shoes with good support.  Drink plenty of water while you exercise to prevent dehydration or heat stroke.  Work out until your breathing and your heartbeat get faster.       Personalized Health Maintenance & Screening Recommendations  None  Lung Cancer Screening Recommended: no (Low Dose CT Chest recommended if Age 75-80years, 30 pack-year currently  smoking OR have quit w/in past 15 years) Hepatitis C Screening recommended: no HIV Screening recommended: no  Advanced Directives: Written information was not prepared per patient's request.  Referrals & Orders No orders of the defined types were placed in this  encounter.   Follow-up Plan . Follow-up with Calvin Reeve, DO as planned . Bring records of Flu shot and Shingrix vaccine.     I have personally reviewed and noted the following in the patient's chart:   . Medical and social history . Use of alcohol, tobacco or illicit drugs  . Current medications and supplements . Functional ability and status . Nutritional status . Physical activity . Advanced directives . List of other physicians . Hospitalizations, surgeries, and ER visits in previous 12 months . Vitals . Screenings to include cognitive, depression, and falls . Referrals and appointments  In addition, I have reviewed and discussed with Calvin Wise certain preventive protocols, quality metrics, and best practice recommendations. A written personalized care plan for preventive services as well as general preventive health recommendations is available and can be mailed to the patient at his request.      Tinnie Gens, RN  08/17/2020

## 2020-08-17 NOTE — Patient Instructions (Addendum)
Diabetes Mellitus and Foot Care Foot care is an important part of your health, especially when you have diabetes. Diabetes may cause you to have problems because of poor blood flow (circulation) to your feet and legs, which can cause your skin to:  Become thinner and drier.  Break more easily.  Heal more slowly.  Peel and crack. You may also have nerve damage (neuropathy) in your legs and feet, causing decreased feeling in them. This means that you may not notice minor injuries to your feet that could lead to more serious problems. Noticing and addressing any potential problems early is the best way to prevent future foot problems. How to care for your feet Foot hygiene  Wash your feet daily with warm water and mild soap. Do not use hot water. Then, pat your feet and the areas between your toes until they are completely dry. Do not soak your feet as this can dry your skin.  Trim your toenails straight across. Do not dig under them or around the cuticle. File the edges of your nails with an emery board or nail file.  Apply a moisturizing lotion or petroleum jelly to the skin on your feet and to dry, brittle toenails. Use lotion that does not contain alcohol and is unscented. Do not apply lotion between your toes.   Shoes and socks  Wear clean socks or stockings every day. Make sure they are not too tight. Do not wear knee-high stockings since they may decrease blood flow to your legs.  Wear shoes that fit properly and have enough cushioning. Always look in your shoes before you put them on to be sure there are no objects inside.  To break in new shoes, wear them for just a few hours a day. This prevents injuries on your feet. Wounds, scrapes, corns, and calluses  Check your feet daily for blisters, cuts, bruises, sores, and redness. If you cannot see the bottom of your feet, use a mirror or ask someone for help.  Do not cut corns or calluses or try to remove them with medicine.  If  you find a minor scrape, cut, or break in the skin on your feet, keep it and the skin around it clean and dry. You may clean these areas with mild soap and water. Do not clean the area with peroxide, alcohol, or iodine.  If you have a wound, scrape, corn, or callus on your foot, look at it several times a day to make sure it is healing and not infected. Check for: ? Redness, swelling, or pain. ? Fluid or blood. ? Warmth. ? Pus or a bad smell.   General tips  Do not cross your legs. This may decrease blood flow to your feet.  Do not use heating pads or hot water bottles on your feet. They may burn your skin. If you have lost feeling in your feet or legs, you may not know this is happening until it is too late.  Protect your feet from hot and cold by wearing shoes, such as at the beach or on hot pavement.  Schedule a complete foot exam at least once a year (annually) or more often if you have foot problems. Report any cuts, sores, or bruises to your health care provider immediately. Where to find more information  American Diabetes Association: www.diabetes.org  Association of Diabetes Care & Education Specialists: www.diabeteseducator.org Contact a health care provider if:  You have a medical condition that increases your risk of infection  and you have any cuts, sores, or bruises on your feet.  You have an injury that is not healing.  You have redness on your legs or feet.  You feel burning or tingling in your legs or feet.  You have pain or cramps in your legs and feet.  Your legs or feet are numb.  Your feet always feel cold.  You have pain around any toenails. Get help right away if:  You have a wound, scrape, corn, or callus on your foot and: ? You have pain, swelling, or redness that gets worse. ? You have fluid or blood coming from the wound, scrape, corn, or callus. ? Your wound, scrape, corn, or callus feels warm to the touch. ? You have pus or a bad smell coming  from the wound, scrape, corn, or callus. ? You have a fever. ? You have a red line going up your leg. Summary  Check your feet every day for blisters, cuts, bruises, sores, and redness.  Apply a moisturizing lotion or petroleum jelly to the skin on your feet and to dry, brittle toenails.  Wear shoes that fit properly and have enough cushioning.  If you have foot problems, report any cuts, sores, or bruises to your health care provider immediately.  Schedule a complete foot exam at least once a year (annually) or more often if you have foot problems. This information is not intended to replace advice given to you by your health care provider. Make sure you discuss any questions you have with your health care provider. Document Revised: 01/30/2020 Document Reviewed: 01/30/2020 Elsevier Patient Education  [place] Maintenance, Male Adopting a healthy lifestyle and getting preventive care are important in promoting health and wellness. Ask your health care provider about:  The right schedule for you to have regular tests and exams.  Things you can do on your own to prevent diseases and keep yourself healthy. What should I know about diet, weight, and exercise? Eat a healthy diet  Eat a diet that includes plenty of vegetables, fruits, low-fat dairy products, and lean protein.  Do not eat a lot of foods that are high in solid fats, added sugars, or sodium.   Maintain a healthy weight Body mass index (BMI) is a measurement that can be used to identify possible weight problems. It estimates body fat based on height and weight. Your health care provider can help determine your BMI and help you achieve or maintain a healthy weight. Get regular exercise Get regular exercise. This is one of the most important things you can do for your health. Most adults should:  Exercise for at least 150 minutes each week. The exercise should increase your heart rate and make you sweat  (moderate-intensity exercise).  Do strengthening exercises at least twice a week. This is in addition to the moderate-intensity exercise.  Spend less time sitting. Even light physical activity can be beneficial. Watch cholesterol and blood lipids Have your blood tested for lipids and cholesterol at 75 years of age, then have this test every 5 years. You may need to have your cholesterol levels checked more often if:  Your lipid or cholesterol levels are high.  You are older than 75 years of age.  You are at high risk for heart disease. What should I know about cancer screening? Many types of cancers can be detected early and may often be prevented. Depending on your health history and family history, you may need to have  cancer screening at various ages. This may include screening for:  Colorectal cancer.  Prostate cancer.  Skin cancer.  Lung cancer. What should I know about heart disease, diabetes, and high blood pressure? Blood pressure and heart disease  High blood pressure causes heart disease and increases the risk of stroke. This is more likely to develop in people who have high blood pressure readings, are of African descent, or are overweight.  Talk with your health care provider about your target blood pressure readings.  Have your blood pressure checked: ? Every 3-5 years if you are 24-78 years of age. ? Every year if you are 46 years old or older.  If you are between the ages of 56 and 63 and are a current or former smoker, ask your health care provider if you should have a one-time screening for abdominal aortic aneurysm (AAA). Diabetes Have regular diabetes screenings. This checks your fasting blood sugar level. Have the screening done:  Once every three years after age 41 if you are at a normal weight and have a low risk for diabetes.  More often and at a younger age if you are overweight or have a high risk for diabetes. What should I know about preventing  infection? Hepatitis B If you have a higher risk for hepatitis B, you should be screened for this virus. Talk with your health care provider to find out if you are at risk for hepatitis B infection. Hepatitis C Blood testing is recommended for:  Everyone born from [birth year] through 1965.  Anyone with known risk factors for hepatitis C. Sexually transmitted infections (STIs)  You should be screened each year for STIs, including gonorrhea and chlamydia, if: ? You are sexually active and are younger than 75 years of age. ? You are older than 75 years of age and your health care provider tells you that you are at risk for this type of infection. ? Your sexual activity has changed since you were last screened, and you are at increased risk for chlamydia or gonorrhea. Ask your health care provider if you are at risk.  Ask your health care provider about whether you are at high risk for HIV. Your health care provider may recommend a prescription medicine to help prevent HIV infection. If you choose to take medicine to prevent HIV, you should first get tested for HIV. You should then be tested every 3 months for as long as you are taking the medicine. Follow these instructions at home: Lifestyle  Do not use any products that contain nicotine or tobacco, such as cigarettes, e-cigarettes, and chewing tobacco. If you need help quitting, ask your health care provider.  Do not use street drugs.  Do not share needles.  Ask your health care provider for help if you need support or information about quitting drugs. Alcohol use  Do not drink alcohol if your health care provider tells you not to drink.  If you drink alcohol: ? Limit how much you have to 0-2 drinks a day. ? Be aware of how much alcohol is in your drink. In the [place] one drink equals one 12 oz bottle of beer (355 mL), one 5 oz glass of wine (148 mL), or one 1 oz glass of hard liquor (44 mL). General instructions  Schedule regular health,  dental, and eye exams.  Stay current with your vaccines.  Tell your health care provider if: ? You often feel depressed. ? You have ever been abused or do not feel  safe at home. Summary  Adopting a healthy lifestyle and getting preventive care are important in promoting health and wellness.  Follow your health care provider's instructions about healthy diet, exercising, and getting tested or screened for diseases.  Follow your health care provider's instructions on monitoring your cholesterol and blood pressure. This information is not intended to replace advice given to you by your health care provider. Make sure you discuss any questions you have with your health care provider. Document Revised: 07/04/2018 Document Reviewed: 07/04/2018 Elsevier Patient Education  2021 [place] Maintenance Summary and Written Plan of Care  Mr. Kotlyar ,  Thank you for allowing me to perform your Medicare Annual Wellness Visit and for your ongoing commitment to your health.   Health Maintenance & Immunization History Health Maintenance  Topic Date Due  . COVID-19 Vaccine (4 - Booster for Moderna series) 11/18/2020  . FOOT EXAM  12/22/2020  . HEMOGLOBIN A1C  01/08/2021  . OPHTHALMOLOGY EXAM  08/14/2021  . COLONOSCOPY (Pts 45-77yrs Insurance coverage will need to be confirmed)  03/23/2025  . TETANUS/TDAP  04/14/2025  . INFLUENZA VACCINE  Completed  . Hepatitis C Screening  Completed  . PNA vac Low Risk Adult  Completed   Immunization History  Administered Date(s) Administered  . Fluad Quad(high Dose 65+) 04/03/2019  . Influenza Split 04/17/2012  . Influenza, High Dose Seasonal PF 04/05/2017, 05/02/2018  . Influenza,inj,Quad PF,6+ Mos 04/24/2013, 03/25/2014, 04/15/2015, 03/09/2016  . Influenza-Unspecified 07/12/2011, 05/25/2020  . Moderna Sars-Covid-2 Vaccination 08/31/2019, 09/28/2019, 05/20/2020  . Pneumococcal Conjugate-13 09/01/2015  .  Pneumococcal Polysaccharide-23 03/23/2012  . Tdap 04/15/2015  . Zoster 10/30/2013    These are the patient goals that we discussed: Goals Addressed              This Visit's Progress   .  Patient Stated (pt-stated)        08/17/2020 AWV Goal: Exercise for General Health   Patient will verbalize understanding of the benefits of increased physical activity:  Exercising regularly is important. It will improve your overall fitness, flexibility, and endurance.  Regular exercise also will improve your overall health. It can help you control your weight, reduce stress, and improve your bone density.  Over the next year, patient will increase physical activity as tolerated with a goal of at least 150 minutes of moderate physical activity per week.   You can tell that you are exercising at a moderate intensity if your heart starts beating faster and you start breathing faster but can still hold a conversation.  Moderate-intensity exercise ideas include:  Walking 1 mile (1.6 km) in about 15 minutes  Biking  Hiking  Golfing  Dancing  Water aerobics  Patient will verbalize understanding of everyday activities that increase physical activity by providing examples like the following: ? Yard work, such as: ? Pushing a Conservation officer, nature ? Raking and bagging leaves ? Washing your car ? Pushing a stroller ? Shoveling snow ? Gardening ? Washing windows or floors  Patient will be able to explain general safety guidelines for exercising:   Before you start a new exercise program, talk with your health care provider.  Do not exercise so much that you hurt yourself, feel dizzy, or get very short of breath.  Wear comfortable clothes and wear shoes with good support.  Drink plenty of water while you exercise to prevent dehydration or heat stroke.  Work out until your breathing and your heartbeat get faster.  This is a list of Health Maintenance Items that are overdue or due  now: There are no preventive care reminders to display for this patient.   Orders/Referrals Placed Today: No orders of the defined types were placed in this encounter.   Follow-up Plan . Follow-up with [name], DO as planned . Bring records of Flu shot and Shingrix vaccine.

## 2020-08-20 DIAGNOSIS — E119 Type 2 diabetes mellitus without complications: Secondary | ICD-10-CM | POA: Diagnosis not present

## 2020-08-20 DIAGNOSIS — Z5111 Encounter for antineoplastic chemotherapy: Secondary | ICD-10-CM | POA: Diagnosis not present

## 2020-08-20 DIAGNOSIS — Z95828 Presence of other vascular implants and grafts: Secondary | ICD-10-CM | POA: Diagnosis not present

## 2020-08-20 DIAGNOSIS — C8339 Diffuse large B-cell lymphoma, extranodal and solid organ sites: Secondary | ICD-10-CM | POA: Diagnosis not present

## 2020-08-20 DIAGNOSIS — Z794 Long term (current) use of insulin: Secondary | ICD-10-CM | POA: Diagnosis not present

## 2020-09-15 ENCOUNTER — Other Ambulatory Visit: Payer: Self-pay | Admitting: Osteopathic Medicine

## 2020-09-15 DIAGNOSIS — E1169 Type 2 diabetes mellitus with other specified complication: Secondary | ICD-10-CM

## 2020-10-14 ENCOUNTER — Encounter: Payer: Self-pay | Admitting: Osteopathic Medicine

## 2020-10-14 ENCOUNTER — Ambulatory Visit (INDEPENDENT_AMBULATORY_CARE_PROVIDER_SITE_OTHER): Payer: Medicare Other | Admitting: Osteopathic Medicine

## 2020-10-14 ENCOUNTER — Other Ambulatory Visit: Payer: Self-pay

## 2020-10-14 VITALS — BP 107/59 | HR 92 | Temp 97.3°F | Wt 213.0 lb

## 2020-10-14 DIAGNOSIS — Z8546 Personal history of malignant neoplasm of prostate: Secondary | ICD-10-CM | POA: Diagnosis not present

## 2020-10-14 DIAGNOSIS — E1169 Type 2 diabetes mellitus with other specified complication: Secondary | ICD-10-CM | POA: Insufficient documentation

## 2020-10-14 DIAGNOSIS — Z794 Long term (current) use of insulin: Secondary | ICD-10-CM

## 2020-10-14 DIAGNOSIS — C8339 Diffuse large B-cell lymphoma, extranodal and solid organ sites: Secondary | ICD-10-CM | POA: Diagnosis not present

## 2020-10-14 DIAGNOSIS — Z789 Other specified health status: Secondary | ICD-10-CM | POA: Diagnosis not present

## 2020-10-14 LAB — POCT GLYCOSYLATED HEMOGLOBIN (HGB A1C): Hemoglobin A1C: 6.5 % — AB (ref 4.0–5.6)

## 2020-10-14 NOTE — Progress Notes (Signed)
Calvin Wise is a 75 y.o. male who presents to  [place] at [facility]  today, 10/14/20, seeking care for the following:  DM2 routine follow-up - A1C under good control. Pt is working with a diabetic nurse through his cancer treatments and this is going well - adjusting insulin based on timing w/ chemo/steroids.    DM2- Medications   Metformin:1000 mg daily  Other antihyperglycemics:Lantus12-16 units qhs based on steroids / chemo, Novolog as well for additional coverage on steroids, Bydureon 2 mg weekly    ACE/ARB:Lisinopril 5 mg daily and BP controlled ? (Microalbumin needed?no)  Antiplatelet if ASCVD Risk >10%:No  Statin:Pravastatin 20 mg daily ? LDL goal <70:last measurement was at 21 in 10/2019 A1C:  07/03/19: 6.5% no hypoglycemia, still on 10-12 units insulin daily without any problems.  11/06/19: A1C 6.2%  03/11/20 A1C 7.4%   07/10/20: 6.4% --> started chemo, advised Glc may increase   Today 10/14/20: 6.5%   Reviewed labs: See care everywhere, HemOnc is checking labs routinely  10/01/20: CMP no concerns on renal/hepatic fxn; CBC ok    ASSESSMENT & PLAN with other pertinent findings:  The primary encounter diagnosis was Type 2 diabetes mellitus with other specified complication, with long-term current use of insulin (Calvin Wise). 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   1. Type 2 diabetes mellitus with other specified complication, with long-term current use of insulin (Calvin Wise) 2. Type 2 diabetes mellitus with other specified complication, without long-term current use of insulin (Calvin Wise) Continue current Rx per oncology diabetic nurse - A1C god, no hypoglycemia  3. History of prostate  cancer Following as directed w/ urology, no new concerns   4. Statin intolerance Lipids over due but will hold off on chekcnig for now, would not change management at this point   5. Diffuse large B-cell lymphoma of solid organ excluding spleen (HCC) Following w/ hemonc, stable    There are no Patient Instructions on file for this visit.  Orders Placed This Encounter  Procedures  . POCT HgB A1C   Results for orders placed or performed in visit on 10/14/20 (from the past 24 hour(s))  POCT HgB A1C     Status: Abnormal   Collection Time: 10/14/20 10:42 AM  Result Value Ref Range   Hemoglobin A1C 6.5 (A) 4.0 - 5.6 %   HbA1c POC (<> result, manual entry)     HbA1c, POC (prediabetic range)     HbA1c, POC (controlled diabetic range)       No orders of the defined types were placed in this encounter.    See below for relevant physical exam findings  See below for recent lab and imaging results reviewed  Medications, allergies, PMH, PSH, SocH, FamH reviewed below    Follow-up instructions: Return in about 3 months (around 01/14/2021) for MEDICARE WELLNESS W/ RN IF NEEDED. OTHERWISE DIABETES FOLLOW-UP WITH DR [name] IN 3 MOS.                                        Exam:  BP (!) 107/59 (BP Location: Left Arm, Patient Position: Sitting, Cuff Size: Normal)   Pulse 92   Temp (!) 97.3 F (36.3  C) (Oral)   Wt 213 lb (96.6 kg)   SpO2 99%   BMI 26.62 kg/m   Constitutional: VS see above. General Appearance: alert, well-developed, well-nourished, NAD  Neck: No masses, trachea midline.   Respiratory: Normal respiratory effort. no wheeze, no rhonchi, no rales  Cardiovascular: S1/S2 normal, no murmur, no rub/gallop auscultated. RRR.   Musculoskeletal: Gait normal. Symmetric and independent movement of all extremities  Neurological: Normal balance/coordination. No tremor.  Skin: warm, dry, intact.   Psychiatric: Normal judgment/insight.  Normal mood and affect. Oriented x3.   Current Meds  Medication Sig  . bimatoprost (LUMIGAN) 0.01 % SOLN Place 1 drop into the right eye nightly.  . Blood Glucose Monitoring Suppl (ACCU-CHEK GUIDE) w/Device KIT Inject 1 Stick into the skin 2 (two) times daily with breakfast and lunch. Dx E11.9 DM  . cetirizine (ZYRTEC) 10 MG tablet Take 10 mg by mouth daily.  . Cholecalciferol (VITAMIN D3) 125 MCG (5000 UT) CAPS Take by mouth.  . co-enzyme Q-10 30 MG capsule Take 1 capsule (30 mg total) by mouth daily.  . dorzolamide-timolol (COSOPT) 22.3-6.8 MG/ML ophthalmic solution Place 1 drop into the right eye 2 times daily.  Marland Kitchen glucose blood (ACCU-CHEK GUIDE) test strip USE AS INSTRUCTED  . insulin glargine (LANTUS) 100 UNIT/ML injection Inject 0.12 mLs (12 Units total) into the skin at bedtime.  . Lancets MISC For use with glucometer qid as directed  . lisinopril (PRINIVIL,ZESTRIL) 5 MG tablet Take 1 tablet (5 mg total) by mouth daily.  . metFORMIN (GLUCOPHAGE) 1000 MG tablet Take 1 tablet (1,000 mg total) by mouth daily with breakfast.  . montelukast (SINGULAIR) 10 MG tablet Take 1 tablet (10 mg total) by mouth at bedtime. For sinus pain.  . pegfilgrastim-bmez (ZIEXTENZO) 6 MG/0.6ML injection Inject into the skin.  . pravastatin (PRAVACHOL) 20 MG tablet TAKE 1 TABLET BY MOUTH EVERY DAY  . sildenafil (REVATIO) 20 MG tablet Take 1-3 tablets (20-60 mg total) by mouth daily as needed (sex).  . sildenafil (VIAGRA) 100 MG tablet Take by mouth at bedtime.    Allergies  Allergen Reactions  . Aspirin Other (See Comments)    Gi bleeding  . Clindamycin Itching  . Clindamycin/Lincomycin     rash  . Bactrim [Sulfamethoxazole-Trimethoprim] Itching and Rash    See progress note from Dr. [name] 07/19/2017. No obvious drug rash, questionable other allergy exposure    Patient Active Problem List   Diagnosis Date Noted  . Type 2 diabetes mellitus with other specified complication (Calvin Wise) 36/46/8032  .  Diffuse large B-cell lymphoma of solid organ excluding spleen (Calvin Wise) 06/24/2020  . Marginal zone lymphoma (Calvin Wise) 06/24/2020  . Statin intolerance 11/01/2017  . Abscess of scalp 07/05/2017  . Lipoma of neck 10/12/2016  . Cervical strain 10/12/2016  . Ingrowing toenail 08/14/2016  . History of prostate cancer 06/11/2014  . Colon polyp 10/30/2013  . TMJ syndrome 02/11/2013  . Glaucoma 05/02/2012  . Irregular heartbeat (Declines Workup 09/2011 [facility]) 05/02/2012  . Erectile dysfunction 05/02/2012  . Essential hypertension 04/17/2012  . Left rotator cuff tear 04/17/2012  . Sciatica 04/17/2012    Family History  Problem Relation Age of Onset  . Alcoholism Father   . Cancer Cousin   . Cancer Cousin   . Cancer Cousin     Social History   Tobacco Use  Smoking Status Former Smoker  . Packs/day: 0.25  . Years: 44.00  . Pack years: 11.00  . Types: Cigarettes  . Quit date: 05/24/2007  .  Years since quitting: 13.4  Smokeless Tobacco Never Used    Past Surgical History:  Procedure Laterality Date  . BACK SURGERY  1997  . HAND SURGERY Bilateral 20 YEARS AGO  . HERNIA REPAIR    . PROSTATE BIOPSY    . RADIOACTIVE SEED IMPLANT N/A 09/19/2014   Procedure: RADIOACTIVE SEED IMPLANT;  Surgeon: Ardis Hughs, MD;  [facility];  Service: Urology;  Laterality: N/A;  . REFRACTIVE SURGERY    . SHOULDER ARTHROSCOPY Right 2007   x2    Immunization History  Administered Date(s) Administered  . Fluad Quad(high Dose 65+) 04/03/2019, 05/13/2020  . Influenza Split 04/17/2012  . Influenza, High Dose Seasonal PF 04/24/2014, 04/15/2015, 02/23/2016, 03/25/2016, 04/05/2017, 05/25/2017, 05/02/2018, 05/25/2018  . Influenza,inj,Quad PF,6+ Mos 04/24/2013, 03/25/2014, 04/15/2015, 03/09/2016  . Influenza-Unspecified 07/12/2011, 05/26/2019, 05/25/2020  . Moderna Sars-Covid-2 Vaccination 08/31/2019, 09/28/2019, 05/20/2020  . Pneumococcal Conjugate-13 04/24/2013, 09/01/2015   . Pneumococcal Polysaccharide-23 03/23/2012  . Tdap 04/15/2015  . Zoster 10/30/2013  . Zoster Recombinat (Shingrix) 08/23/2018, 01/13/2019    Recent Results (from the past 2160 hour(s))  POCT HgB A1C     Status: Abnormal   Collection Time: 10/14/20 10:42 AM  Result Value Ref Range   Hemoglobin A1C 6.5 (A) 4.0 - 5.6 %   HbA1c POC (<> result, manual entry)     HbA1c, POC (prediabetic range)     HbA1c, POC (controlled diabetic range)      No results found.     All questions at time of visit were answered - patient instructed to contact office with any additional concerns or updates. ER/RTC precautions were reviewed with the patient as applicable.   Please note: manual typing as well as voice recognition software may have been used to produce this document - typos may escape review. Please contact Dr. [name] for any needed clarifications.

## 2020-10-26 ENCOUNTER — Telehealth: Payer: Self-pay

## 2020-10-26 NOTE — Telephone Encounter (Signed)
Pt called requesting an appt with provider. Per pt, having issues with his sinuses. Pls contact the pt for scheduling. Can give patient the option of seeing another provider sooner if needed. Thanks in advance.

## 2020-10-26 NOTE — Telephone Encounter (Signed)
Virtual with PCP has been scheduled. AM

## 2020-10-27 ENCOUNTER — Telehealth (INDEPENDENT_AMBULATORY_CARE_PROVIDER_SITE_OTHER): Payer: Medicare Other | Admitting: Osteopathic Medicine

## 2020-10-27 ENCOUNTER — Encounter: Payer: Self-pay | Admitting: Osteopathic Medicine

## 2020-10-27 DIAGNOSIS — J3489 Other specified disorders of nose and nasal sinuses: Secondary | ICD-10-CM | POA: Diagnosis not present

## 2020-10-27 DIAGNOSIS — Z794 Long term (current) use of insulin: Secondary | ICD-10-CM | POA: Diagnosis not present

## 2020-10-27 DIAGNOSIS — R0982 Postnasal drip: Secondary | ICD-10-CM

## 2020-10-27 DIAGNOSIS — E1165 Type 2 diabetes mellitus with hyperglycemia: Secondary | ICD-10-CM | POA: Diagnosis not present

## 2020-10-27 MED ORDER — ACCU-CHEK GUIDE VI STRP
ORAL_STRIP | 99 refills | Status: AC
Start: 2020-10-27 — End: ?

## 2020-10-27 MED ORDER — PREDNISONE 20 MG PO TABS: 20.0000 mg | ORAL_TABLET | Freq: Two times a day (BID) | ORAL | 0 refills | Status: DC

## 2020-10-27 MED ORDER — IPRATROPIUM BROMIDE 0.06 % NA SOLN: 2.0000 | Freq: Four times a day (QID) | NASAL | 1 refills | Status: DC

## 2020-10-27 NOTE — Progress Notes (Signed)
Telemedicine Visit via  Audio only - telephone (patient preference /  technical difficulty with MyChart video application)  I connected with Calvin Wise on 10/27/20 at 7:21 AM  by phone or  telemedicine application as noted above  I verified that I am speaking with or regarding  the correct patient using two identifiers.  Participants: Myself, Dr [name] DO Patient: Calvin Wise Patient proxy if applicable: none Other, if applicable: none  Patient is at home I am in office at [facility]    I discussed the limitations of evaluation and management  by telemedicine and the availability of in person appointments.  The participant(s) above expressed understanding and  agreed to proceed with this appointment via telemedicine.       History of Present Illness: Calvin Wise is a 75 y.o. male who would like to discuss sinus drainage     Sinus drainage down throat causing cough Worse at night Worse after treatment on 10/22/20 OTC Rx: sinus PE allergy medication which helps some Usually takes flonase fo allergies, not helping much now        Observations/Objective: There were no vitals taken for this visit. BP Readings from Last 3 Encounters:  10/14/20 (!) 107/59  07/10/20 (!) 144/77  03/11/20 121/74   Exam: Normal Speech.  NAD  Lab and Radiology Results No results found for this or any previous visit (from the past 72 hour(s)). No results found.     Assessment and Plan: 75 y.o. male with The primary encounter diagnosis was Sinus drainage. Diagnoses of Post-nasal drip and Type 2 diabetes mellitus with hyperglycemia, with long-term current use of insulin ([place]) were also pertinent to this visit.  1. Sinus drainage 2. Post-nasal drip Likely viral vs severe allergies (pollen counts have been high) Advised tp doesn't sound like bacterial sinusitis Trial steroid burst and atrovent nasal spray in place of flonase If no better after  this Thurs/Fri would reevaluate   3. Type 2 diabetes mellitus with hyperglycemia, with long-term current use of insulin (HCC) Refilled test strips    PDMP not reviewed this encounter. No orders of the defined types were placed in this encounter.  No orders of the defined types were placed in this encounter.  There are no Patient Instructions on file for this visit.  Instructions sent via MyChart.   Follow Up Instructions: No follow-ups on file.    I discussed the assessment and treatment plan with the patient. The patient was provided an opportunity to ask questions and all were answered. The patient agreed with the plan and demonstrated an understanding of the instructions.   The patient was advised to call back or seek an in-person evaluation if any new concerns, if symptoms worsen or if the condition fails to improve as anticipated.  21 minutes of non-face-to-face time was provided during this encounter.      . . . . . . . . . . . . . Marland Kitchen                   Historical information moved to improve visibility of documentation.  Past Medical History:  Diagnosis Date  . Diabetes mellitus without complication ([place])    Type II  . Erectile dysfunction 05/02/2012  . Glaucoma 05/02/2012  . History of GI bleed 2013   Related to ASA use  . Irregular heartbeat (Declines Workup 09/2011 [facility]) 05/02/2012  . Left rotator cuff tear 04/17/2012  . Prostate cancer ([place])  prostate   Past Surgical History:  Procedure Laterality Date  . BACK SURGERY  1997  . HAND SURGERY Bilateral 20 YEARS AGO  . HERNIA REPAIR    . PROSTATE BIOPSY    . RADIOACTIVE SEED IMPLANT N/A 09/19/2014   Procedure: RADIOACTIVE SEED IMPLANT;  Surgeon: Ardis Hughs, MD;  [facility]: [facility];  Service: Urology;  Laterality: N/A;  . REFRACTIVE SURGERY    . SHOULDER ARTHROSCOPY Right 2007   x2   Social History   Tobacco Use  . Smoking status: Former Smoker     Packs/day: 0.25    Years: 44.00    Pack years: 11.00    Types: Cigarettes    Quit date: 05/24/2007    Years since quitting: 13.4  . Smokeless tobacco: Never Used  Substance Use Topics  . Alcohol use: Yes    Comment: occasion   family history includes Alcoholism in his father; Cancer in his cousin, cousin, and cousin.  Medications: Current Outpatient Medications  Medication Sig Dispense Refill  . bimatoprost (LUMIGAN) 0.01 % SOLN Place 1 drop into the right eye nightly.    . Blood Glucose Monitoring Suppl (ACCU-CHEK GUIDE) w/Device KIT Inject 1 Stick into the skin 2 (two) times daily with breakfast and lunch. Dx E11.9 DM 1 kit prn  . cetirizine (ZYRTEC) 10 MG tablet Take 10 mg by mouth daily.    . Cholecalciferol (VITAMIN D3) 125 MCG (5000 UT) CAPS Take by mouth.    . co-enzyme Q-10 30 MG capsule Take 1 capsule (30 mg total) by mouth daily. 90 capsule 3  . dorzolamide-timolol (COSOPT) 22.3-6.8 MG/ML ophthalmic solution Place 1 drop into the right eye 2 times daily.    . Exenatide ER (BYDUREON BCISE) 2 MG/0.85ML AUIJ Inject 2 mg into the skin once a week. 4 pen 3  . glucose blood (ACCU-CHEK GUIDE) test strip USE AS INSTRUCTED 100 each 11  . insulin glargine (LANTUS) 100 UNIT/ML injection Inject 0.12 mLs (12 Units total) into the skin at bedtime. 10 mL 99  . Lancets MISC For use with glucometer qid as directed 100 each 99  . lisinopril (PRINIVIL,ZESTRIL) 5 MG tablet Take 1 tablet (5 mg total) by mouth daily. 30 tablet 1  . metFORMIN (GLUCOPHAGE) 1000 MG tablet Take 1 tablet (1,000 mg total) by mouth daily with breakfast. 90 tablet 1  . montelukast (SINGULAIR) 10 MG tablet Take 1 tablet (10 mg total) by mouth at bedtime. For sinus pain. 30 tablet 3  . pegfilgrastim-bmez (ZIEXTENZO) 6 MG/0.6ML injection Inject into the skin.    . pravastatin (PRAVACHOL) 20 MG tablet TAKE 1 TABLET BY MOUTH EVERY DAY 90 tablet 3  . sildenafil (REVATIO) 20 MG tablet Take 1-3 tablets (20-60 mg total) by mouth  daily as needed (sex). 50 tablet 2  . sildenafil (VIAGRA) 100 MG tablet Take by mouth at bedtime.     No current facility-administered medications for this visit.   Allergies  Allergen Reactions  . Aspirin Other (See Comments), Itching and Rash    Gi bleeding  . Clindamycin Itching  . Clindamycin/Lincomycin     rash  . Bactrim [Sulfamethoxazole-Trimethoprim] Itching and Rash    See progress note from Dr. [name] 07/19/2017. No obvious drug rash, questionable other allergy exposure     If phone visit, billing and coding can please add appropriate modifier if needed

## 2020-11-10 ENCOUNTER — Other Ambulatory Visit: Payer: Self-pay | Admitting: Osteopathic Medicine

## 2020-11-18 ENCOUNTER — Other Ambulatory Visit: Payer: Self-pay | Admitting: Osteopathic Medicine

## 2020-12-17 ENCOUNTER — Other Ambulatory Visit: Payer: Self-pay | Admitting: Osteopathic Medicine

## 2021-01-07 ENCOUNTER — Telehealth: Payer: Self-pay | Admitting: Osteopathic Medicine

## 2021-01-07 NOTE — Chronic Care Management (AMB) (Signed)
  Chronic Care Management   Outreach Note  01/07/2021 Name: Calvin Wise MRN: [id number] DOB: Dec 19, 1945  Referred by: [name], DO Reason for referral : No chief complaint on file.   An unsuccessful telephone outreach was attempted today. The patient was referred to the pharmacist for assistance with care management and care coordination.   Follow Up Plan:   Lauretta Grill Upstream Scheduler

## 2021-01-11 LAB — HM DIABETES EYE EXAM

## 2021-01-13 ENCOUNTER — Encounter: Payer: Self-pay | Admitting: Osteopathic Medicine

## 2021-01-13 ENCOUNTER — Ambulatory Visit (INDEPENDENT_AMBULATORY_CARE_PROVIDER_SITE_OTHER): Payer: Medicare Other | Admitting: Osteopathic Medicine

## 2021-01-13 ENCOUNTER — Other Ambulatory Visit: Payer: Self-pay

## 2021-01-13 VITALS — BP 116/60 | HR 57 | Temp 97.8°F | Wt 219.0 lb

## 2021-01-13 DIAGNOSIS — E1169 Type 2 diabetes mellitus with other specified complication: Secondary | ICD-10-CM

## 2021-01-13 DIAGNOSIS — Z794 Long term (current) use of insulin: Secondary | ICD-10-CM

## 2021-01-13 DIAGNOSIS — I10 Essential (primary) hypertension: Secondary | ICD-10-CM

## 2021-01-13 DIAGNOSIS — Z8546 Personal history of malignant neoplasm of prostate: Secondary | ICD-10-CM | POA: Diagnosis not present

## 2021-01-13 LAB — POCT GLYCOSYLATED HEMOGLOBIN (HGB A1C): Hemoglobin A1C: 6.1 % — AB (ref 4.0–5.6)

## 2021-01-13 NOTE — Progress Notes (Signed)
Calvin Wise is a 75 y.o. male who presents to  [name] at [facility]  today, 01/13/21, seeking care for the following:  Diabetes check-up: A1C today 6.1, no hypoglycemia Requesting PSA testing      ASSESSMENT & PLAN with other pertinent findings:  The primary encounter diagnosis was Type 2 diabetes mellitus with other specified complication, with long-term current use of insulin ([place]). Diagnoses of History of prostate cancer and Essential hypertension were also pertinent to this visit.   A1C good control Labs as below   There are no Patient Instructions on file for this visit.  Orders Placed This Encounter  Procedures   Lipid panel   PSA, Total with Reflex to PSA, Free   POCT HgB A1C    No orders of the defined types were placed in this encounter.    See below for relevant physical exam findings  See below for recent lab and imaging results reviewed  Medications, allergies, PMH, PSH, SocH, [place] reviewed below    Follow-up instructions: Return in about 4 months (around 05/15/2021) for MONITOR A1C, SEE [place] SOONER IF NEEDED. CALL/MESSAGE W/ QUESTIONS.                                        Exam:  BP 116/60 (BP Location: Left Arm, Patient Position: Sitting, Cuff Size: Normal)   Pulse (!) 57   Temp 97.8 F (36.6 C) (Oral)   Wt 219 lb 0.6 oz (99.4 kg)   BMI 27.38 kg/m  Constitutional: VS see above. General Appearance: alert, well-developed, well-nourished, NAD Neck: No masses, trachea midline.  Respiratory: Normal respiratory effort. no wheeze, no rhonchi, no rales Cardiovascular: S1/S2 normal, no murmur, no rub/gallop auscultated. RRR.  Musculoskeletal: Gait normal. Symmetric and independent movement of all extremitie Neurological: Normal balance/coordination. No tremor. Skin: warm, dry, intact.  Psychiatric: Normal judgment/insight. Normal mood and affect. Oriented x3.    Current Meds  Medication Sig   bimatoprost (LUMIGAN) 0.01 % SOLN Place 1 drop into the right eye nightly.   Blood Glucose Monitoring Suppl (ACCU-CHEK GUIDE) w/Device KIT INJECT 1 STICK INTO THE SKIN 2 (TWO) TIMES DAILY WITH BREAKFAST AND LUNCH. DX E11.9 DM   cetirizine (ZYRTEC) 10 MG tablet Take 10 mg by mouth daily.   Cholecalciferol (VITAMIN D3) 125 MCG (5000 UT) CAPS Take by mouth.   co-enzyme Q-10 30 MG capsule Take 1 capsule (30 mg total) by mouth daily.   dorzolamide-timolol (COSOPT) 22.3-6.8 MG/ML ophthalmic solution Place 1 drop into the right eye 2 times daily.   Exenatide ER (BYDUREON BCISE) 2 MG/0.85ML AUIJ Inject 2 mg into the skin once a week.   glucose blood (ACCU-CHEK GUIDE) test strip USE AS INSTRUCTED   insulin glargine (LANTUS) 100 UNIT/ML injection Inject 0.12 mLs (12 Units total) into the skin at bedtime.   ipratropium (ATROVENT) 0.06 % nasal spray PLACE 2 SPRAYS INTO BOTH NOSTRILS 4 (FOUR) TIMES DAILY. AS NEEDED FOR RUNNY NOSE / POSTNASAL DRIP   Lancets MISC For use with glucometer qid as directed   lisinopril (PRINIVIL,ZESTRIL) 5 MG tablet Take 1 tablet (5 mg total) by mouth daily.   metFORMIN (GLUCOPHAGE) 1000 MG tablet Take 1 tablet (1,000 mg total) by mouth daily with breakfast.   montelukast (SINGULAIR) 10 MG tablet Take 1 tablet (10 mg total) by mouth at bedtime. For sinus pain.   pegfilgrastim-bmez (ZIEXTENZO) 6  MG/0.6ML injection Inject into the skin.   pravastatin (PRAVACHOL) 20 MG tablet TAKE 1 TABLET BY MOUTH EVERY DAY   predniSONE (DELTASONE) 20 MG tablet Take 1 tablet (20 mg total) by mouth 2 (two) times daily with a meal.   sildenafil (REVATIO) 20 MG tablet Take 1-3 tablets (20-60 mg total) by mouth daily as needed (sex).   sildenafil (VIAGRA) 100 MG tablet Take by mouth at bedtime.    Allergies  Allergen Reactions   Aspirin Other (See Comments), Itching and Rash    Gi bleeding   Clindamycin Itching   Clindamycin/Lincomycin     rash   Bactrim  [Sulfamethoxazole-Trimethoprim] Itching and Rash    See progress note from Dr. [name] 07/19/2017. No obvious drug rash, questionable other allergy exposure    Patient Active Problem List   Diagnosis Date Noted   Type 2 diabetes mellitus with other specified complication ([place]) 55/73/2202   Diffuse large B-cell lymphoma of solid organ excluding spleen ([place]) 06/24/2020   Marginal zone lymphoma (HCC) 06/24/2020   Statin intolerance 11/01/2017   Abscess of scalp 07/05/2017   Lipoma of neck 10/12/2016   Cervical strain 10/12/2016   Ingrowing toenail 08/14/2016   History of prostate cancer 06/11/2014   Colon polyp 10/30/2013   TMJ syndrome 02/11/2013   Glaucoma 05/02/2012   Irregular heartbeat (Declines Workup 09/2011 [facility]) 05/02/2012   Erectile dysfunction 05/02/2012   Essential hypertension 04/17/2012   Left rotator cuff tear 04/17/2012   Sciatica 04/17/2012    Family History  Problem Relation Age of Onset   Alcoholism Father    Cancer Cousin    Cancer Cousin    Cancer Cousin     Social History   Tobacco Use  Smoking Status Former   Packs/day: 0.25   Years: 44.00   Pack years: 11.00   Types: Cigarettes   Quit date: 05/24/2007   Years since quitting: 13.6  Smokeless Tobacco Never    Past Surgical History:  Procedure Laterality Date   BACK SURGERY  1997   HAND SURGERY Bilateral 20 YEARS AGO   HERNIA REPAIR     PROSTATE BIOPSY     RADIOACTIVE SEED IMPLANT N/A 09/19/2014   Procedure: RADIOACTIVE SEED IMPLANT;  Surgeon: Ardis Hughs, MD;  Location[facility];  Service: Urology;  Laterality: N/A;   REFRACTIVE SURGERY     SHOULDER ARTHROSCOPY Right 2007   x2    Immunization History  Administered Date(s) Administered   Fluad Quad(high Dose 65+) 04/03/2019, 05/13/2020   Influenza Split 04/17/2012   Influenza, High Dose Seasonal PF 04/24/2014, 04/15/2015, 02/23/2016, 03/25/2016, 04/05/2017, 05/25/2017, 05/02/2018, 05/25/2018    Influenza,inj,Quad PF,6+ Mos 04/24/2013, 03/25/2014, 04/15/2015, 03/09/2016   Influenza-Unspecified 07/12/2011, 05/26/2019, 05/25/2020   Moderna Sars-Covid-2 Vaccination 08/31/2019, 09/28/2019, 05/20/2020, 11/13/2020   Pneumococcal Conjugate-13 04/24/2013, 09/01/2015   Pneumococcal Polysaccharide-23 03/23/2012   Tdap 04/15/2015   Zoster Recombinat (Shingrix) 08/23/2018, 01/13/2019   Zoster, Live 10/30/2013    Recent Results (from the past 2160 hour(s))  Lipid panel     Status: None   Collection Time: 01/13/21 12:00 AM  Result Value Ref Range   Cholesterol 110 <200 mg/dL   HDL 44 > OR = 40 mg/dL   Triglycerides 84 <150 mg/dL   LDL Cholesterol (Calc) 50 mg/dL (calc)    Comment: Reference range: <100 . Desirable range <100 mg/dL for primary prevention;   <70 mg/dL for patients with CHD or diabetic patients  with > or = 2 CHD risk factors. Marland Kitchen LDL-C is  now calculated using the Martin-Hopkins  calculation, which is a validated novel method providing  better accuracy than the Friedewald equation in the  estimation of LDL-C.  Cresenciano Genre et al. Annamaria Helling. 0962;836(62): 2061-2068  (http://education.[employer].com/faq/FAQ164)    Total CHOL/HDL Ratio 2.5 <5.0 (calc)   Non-HDL Cholesterol (Calc) 66 <130 mg/dL (calc)    Comment: For patients with diabetes plus 1 major ASCVD risk  factor, treating to a non-HDL-C goal of <100 mg/dL  (LDL-C of <70 mg/dL) is considered a therapeutic  option.   PSA, Total with Reflex to PSA, Free     Status: None   Collection Time: 01/13/21 12:00 AM  Result Value Ref Range   PSA, Total <0.1 < OR = 4.0 ng/mL  POCT HgB A1C     Status: Abnormal   Collection Time: 01/13/21 10:46 AM  Result Value Ref Range   Hemoglobin A1C 6.1 (A) 4.0 - 5.6 %   HbA1c POC (<> result, manual entry)     HbA1c, POC (prediabetic range)     HbA1c, POC (controlled diabetic range)      No results found.     All questions at time of visit were answered - patient instructed to  contact office with any additional concerns or updates. ER/RTC precautions were reviewed with the patient as applicable.   Please note: manual typing as well as voice recognition software may have been used to produce this document - typos may escape review. Please contact Dr. [name] for any needed clarifications.

## 2021-01-14 LAB — LIPID PANEL
Cholesterol: 110 mg/dL (ref <200)
HDL: 44 mg/dL (ref >=40)
LDL Cholesterol (Calc): 50 mg/dL (calc)
Non-HDL Cholesterol (Calc): 66 mg/dL (calc) (ref <130)
Total CHOL/HDL Ratio: 2.5 (calc) (ref <5.0)
Triglycerides: 84 mg/dL (ref <150)

## 2021-01-14 LAB — PSA, TOTAL WITH REFLEX TO PSA, FREE: PSA, Total: <0.1 ng/mL (ref <=4.0)

## 2021-01-22 ENCOUNTER — Telehealth: Payer: Self-pay | Admitting: Osteopathic Medicine

## 2021-01-22 NOTE — Chronic Care Management (AMB) (Signed)
  Chronic Care Management   Note  01/22/2021 Name: Calvin Wise MRN: [id number] DOB: [birth year]  Calvin Wise is a 75 y.o. year old male who is a primary care patient of [name], DO. I reached out to Reinaldo Berber by phone today in response to a referral sent by Calvin Wise's PCP, [name], DO.   Calvin Wise was given information about Chronic Care Management services today including:  CCM service includes personalized support from designated clinical staff supervised by his physician, including individualized plan of care and coordination with other care providers 24/7 contact phone numbers for assistance for urgent and routine care needs. Service will only be billed when office clinical staff spend 20 minutes or more in a month to coordinate care. Only one practitioner may furnish and bill the service in a calendar month. The patient may stop CCM services at any time (effective at the end of the month) by phone call to the office staff.   Patient agreed to services and verbal consent obtained.   Follow up plan:   Calvin Wise

## 2021-02-23 ENCOUNTER — Telehealth: Payer: Self-pay | Admitting: Pharmacist

## 2021-02-23 NOTE — Chronic Care Management (AMB) (Signed)
Chronic Care Management Pharmacy Assistant   Name: EFE FAZZINO  MRN: [id number] DOB: 06/04/1946  Calvin Wise is an 75 y.o. year old male who presents for his initial CCM visit with the clinical pharmacist.  Reason for Encounter: Initial CCM Visit  Recent office visits:  01/13/21- [name], DO- seen for diabetes check up and PSA testing, labs ordered, no medication changes, follow up 4 months  10/27/20- [name], DO (Video Visit) - seen for sinus drainage, started ipratropium bromide 0.06% as needed, short course prednisone 20 mg x 5 days, no follow up documented 03/23/22[name], DO- seen for dm2 follow up, no medication changes, follow up 3 months, visit noted patient started chemo 07/10/20  Recent consult visits:  01/11/21- Lonie Peak, MD (Ophthalmology)- seen for glaucoma evaluation, started brimonidine 0.2% 1 drop into the right eye three times daily, follow up 2 months  12/09/20- Radiology  Procedure Visit- Tunneled central venous catheter removal  Hospital visits:  None in previous 6 months  Medications: Outpatient Encounter Medications as of 02/23/2021  Medication Sig   bimatoprost (LUMIGAN) 0.01 % SOLN Place 1 drop into the right eye nightly.   Blood Glucose Monitoring Suppl (ACCU-CHEK GUIDE) w/Device KIT INJECT 1 STICK INTO THE SKIN 2 (TWO) TIMES DAILY WITH BREAKFAST AND LUNCH. DX E11.9 DM   cetirizine (ZYRTEC) 10 MG tablet Take 10 mg by mouth daily.   Cholecalciferol (VITAMIN D3) 125 MCG (5000 UT) CAPS Take by mouth.   co-enzyme Q-10 30 MG capsule Take 1 capsule (30 mg total) by mouth daily.   dorzolamide-timolol (COSOPT) 22.3-6.8 MG/ML ophthalmic solution Place 1 drop into the right eye 2 times daily.   Exenatide ER (BYDUREON BCISE) 2 MG/0.85ML AUIJ Inject 2 mg into the skin once a week.   glucose blood (ACCU-CHEK GUIDE) test strip USE AS INSTRUCTED   insulin glargine (LANTUS) 100 UNIT/ML injection Inject 0.12 mLs (12 Units  total) into the skin at bedtime.   ipratropium (ATROVENT) 0.06 % nasal spray PLACE 2 SPRAYS INTO BOTH NOSTRILS 4 (FOUR) TIMES DAILY. AS NEEDED FOR RUNNY NOSE / POSTNASAL DRIP   Lancets MISC For use with glucometer qid as directed   lisinopril (PRINIVIL,ZESTRIL) 5 MG tablet Take 1 tablet (5 mg total) by mouth daily.   metFORMIN (GLUCOPHAGE) 1000 MG tablet Take 1 tablet (1,000 mg total) by mouth daily with breakfast.   montelukast (SINGULAIR) 10 MG tablet Take 1 tablet (10 mg total) by mouth at bedtime. For sinus pain.   pegfilgrastim-bmez (ZIEXTENZO) 6 MG/0.6ML injection Inject into the skin.   pravastatin (PRAVACHOL) 20 MG tablet TAKE 1 TABLET BY MOUTH EVERY DAY   predniSONE (DELTASONE) 20 MG tablet Take 1 tablet (20 mg total) by mouth 2 (two) times daily with a meal.   sildenafil (REVATIO) 20 MG tablet Take 1-3 tablets (20-60 mg total) by mouth daily as needed (sex).   sildenafil (VIAGRA) 100 MG tablet Take by mouth at bedtime.   No facility-administered encounter medications on file as of 02/23/2021.    Current Documented Medications bimatoprost 0.01 % SOLN cetirizine  10 MG tablet Cholecalciferol 125 MCG  CAPS co-enzyme Q-10 30 MG capsule dorzolamide-timolol 22.3-6.8 MG/ML ophthalmic solution Exenatide ER 2 MG/0.85ML AUIJ insulin glargine 100 UNIT/ML injection ipratropium 0.06 % nasal spray- 30 DS last filled 11/19/20 lisinopril 5 MG tablet metFORMIN 1000 MG - 90 DS last filled 12/26/20 montelukast 10 MG tablet pegfilgrastim-bmez  6 MG/0.6ML injection pravastatin  20 MG- 90 DS last filled 01/27/21 predniSONE 20  MG tablet sildenafil 20 MG tablet sildenafil 100 MG tablet   [employer], CMA

## 2021-03-01 ENCOUNTER — Other Ambulatory Visit: Payer: Self-pay

## 2021-03-01 ENCOUNTER — Ambulatory Visit (INDEPENDENT_AMBULATORY_CARE_PROVIDER_SITE_OTHER): Payer: Medicare Other | Admitting: Pharmacist

## 2021-03-01 DIAGNOSIS — Z794 Long term (current) use of insulin: Secondary | ICD-10-CM | POA: Diagnosis not present

## 2021-03-01 DIAGNOSIS — I10 Essential (primary) hypertension: Secondary | ICD-10-CM | POA: Diagnosis not present

## 2021-03-01 DIAGNOSIS — Z789 Other specified health status: Secondary | ICD-10-CM

## 2021-03-01 DIAGNOSIS — E1169 Type 2 diabetes mellitus with other specified complication: Secondary | ICD-10-CM

## 2021-03-01 NOTE — Progress Notes (Signed)
Chronic Care Management Pharmacy Note  03/01/2021 Name:  Calvin Wise MRN:  [id number] DOB:  [birth year]  Summary: addressed DM, HTN, HLD  Recommendations/Changes made from today's visit: no changes, though may consider optimizing metformin (or addition of GLP-1) in order to decrease/discontinue insulin  Plan: f/u with pharmacist in 1 month  Subjective: Calvin Wise is an 75 y.o. year old male who is a primary patient of [name], DO.  The CCM team was consulted for assistance with disease management and care coordination needs.    Engaged with patient by telephone for initial visit in response to provider referral for pharmacy case management and/or care coordination services.   Consent to Services:  The patient was given information about Chronic Care Management services, agreed to services, and gave verbal consent prior to initiation of services.  Please see initial visit note for detailed documentation.   Patient Care Team: [name], DO as PCP - General (Osteopathic Medicine) Calvin Wise  as Consulting Physician (Glaucoma Ophthalmology) Darius Bump, [facility] as Pharmacist (Pharmacist)  Recent office visits:  01/13/21- [name], DO- seen for diabetes check up and PSA testing, labs ordered, no medication changes, follow up 4 months 10/27/20- [name], DO (Video Visit) - seen for sinus drainage, started ipratropium bromide 0.06% as needed, short course prednisone 20 mg x 5 days, no follow up documented 10/14/20- [name], DO- seen for dm2 follow up, no medication changes, follow up 3 months, visit noted patient started chemo 07/10/20   Recent consult visits:  01/11/21- Lonie Peak, MD (Ophthalmology)- seen for glaucoma evaluation, started brimonidine 0.2% 1 drop into the right eye three times daily, follow up 2 months 12/09/20- Radiology  Procedure Visit- Tunneled central venous catheter removal   Hospital visits:  None  in previous 6 months  Objective:  Lab Results  Component Value Date   CREATININE 1.20 (H) 12/18/2019   CREATININE 1.0 11/13/2019   CREATININE 1.0 10/09/2018    Lab Results  Component Value Date   HGBA1C 6.1 (A) 01/13/2021   Last diabetic Eye exam:  Lab Results  Component Value Date/Time   HMDIABEYEEXA Retinopathy (A) 05/31/2017 12:00 AM    Last diabetic Foot exam: No results found for: HMDIABFOOTEX      Component Value Date/Time   CHOL 110 01/13/2021 0000   TRIG 84 01/13/2021 0000   HDL 44 01/13/2021 0000   CHOLHDL 2.5 01/13/2021 0000   VLDL 16 10/30/2013 1002   LDLCALC 50 01/13/2021 0000    Hepatic Function Latest Ref Rng & Units 12/18/2019 11/13/2019 10/09/2018  Total Protein 6.1 - 8.1 g/dL 6.9 - -  Albumin 3.5 - 5.0 - 3.9 -  AST 10 - 35 U/L 19 17 28   ALT 9 - 46 U/L 13 20 30   Alk Phosphatase 25 - 125 - 58 -  Total Bilirubin 0.2 - 1.2 mg/dL 1.0 - -  Bilirubin, Direct 0.01 - 0.4 - 0.3 -    Lab Results  Component Value Date/Time   TSH 2.10 12/18/2019 03:11 PM   TSH 1.98 11/13/2019 12:00 AM   TSH 2.34 04/05/2017 08:47 AM    CBC Latest Ref Rng & Units 12/18/2019 12/04/2019 11/13/2019  WBC 3.8 - 10.8 Thousand/uL 5.2 5.4 5.2  Hemoglobin 13.2 - 17.1 g/dL 12.8(L) 12.9(A) 12.9(A)  Hematocrit 38.5 - 50.0 % 39.6 40(A) 41  Platelets 140 - 400 Thousand/uL 141 159 135(A)    Lab Results  Component Value Date/Time   VD25OH 30.82 11/13/2019 12:00 AM  VD25OH 24 (L) 04/05/2017 08:47 AM    Social History   Tobacco Use  Smoking Status Former   Packs/day: 0.25   Years: 44.00   Pack years: 11.00   Types: Cigarettes   Quit date: 05/24/2007   Years since quitting: 13.7  Smokeless Tobacco Never   BP Readings from Last 3 Encounters:  01/13/21 116/60  10/14/20 (!) 107/59  07/10/20 (!) 144/77   Pulse Readings from Last 3 Encounters:  01/13/21 (!) 57  10/14/20 92  07/10/20 (!) 57   Wt Readings from Last 3 Encounters:  01/13/21 219 lb 0.6 oz (99.4 kg)  10/14/20 213  lb (96.6 kg)  07/10/20 234 lb 0.6 oz (106.2 kg)    Assessment: Review of patient past medical history, allergies, medications, health status, including review of consultants reports, laboratory and other test data, was performed as part of comprehensive evaluation and provision of chronic care management services.   SDOH:  (Social Determinants of Health) assessments and interventions performed:    CCM Care Plan  Allergies  Allergen Reactions   Aspirin Other (See Comments), Itching and Rash    Gi bleeding   Clindamycin Itching   Clindamycin/Lincomycin     rash   Bactrim [Sulfamethoxazole-Trimethoprim] Itching and Rash    See progress note from Dr. [name] 07/19/2017. No obvious drug rash, questionable other allergy exposure    Medications Reviewed Today     Reviewed by Mertha Finders, CMA (Certified Medical Assistant) on 01/13/21 at 1045  Med List Status: <None>   Medication Order Taking? Sig Documenting Provider Last Dose Status Informant  bimatoprost (LUMIGAN) 0.01 % SOLN [id number] Yes Place 1 drop into the right eye nightly. [provider] Taking Active   Blood Glucose Monitoring Suppl (ACCU-CHEK GUIDE) w/Device KIT [id number] Yes INJECT 1 STICK INTO THE SKIN 2 (TWO) TIMES DAILY WITH BREAKFAST AND LUNCH. DX E11.9 DM [name], DO Taking Active   cetirizine (ZYRTEC) 10 MG tablet [id number] Yes Take 10 mg by mouth daily. [provider] Taking Active   Cholecalciferol (VITAMIN D3) 125 MCG (5000 UT) CAPS [id number] Yes Take by mouth. [provider] Taking Active Self  co-enzyme Q-10 30 MG capsule [id number] Yes Take 1 capsule (30 mg total) by mouth daily. [name], DO Taking Active   dorzolamide-timolol (COSOPT) 22.3-6.8 MG/ML ophthalmic solution [id number] Yes Place 1 drop into the right eye 2 times daily. [provider] Taking Active   Exenatide ER (BYDUREON BCISE) 2 MG/0.85ML AUIJ [id number] Yes Inject 2 mg into the skin  once a week. [name], DO Taking Active   glucose blood (ACCU-CHEK GUIDE) test strip [id number] Yes USE AS INSTRUCTED [name], DO Taking Active   insulin glargine (LANTUS) 100 UNIT/ML injection [id number] Yes Inject 0.12 mLs (12 Units total) into the skin at bedtime. [name], DO Taking Active   ipratropium (ATROVENT) 0.06 % nasal spray [id number] Yes PLACE 2 SPRAYS INTO BOTH NOSTRILS 4 (FOUR) TIMES DAILY. AS NEEDED FOR RUNNY NOSE / POSTNASAL DRIP [name], DO Taking Active   Lancets MISC [id number] Yes For use with glucometer qid as directed [name], DO Taking Active   lisinopril (PRINIVIL,ZESTRIL) 5 MG tablet [id number] Yes Take 1 tablet (5 mg total) by mouth daily. [name], DO Taking Active Self  metFORMIN (GLUCOPHAGE) 1000 MG tablet [id number] Yes Take 1 tablet (1,000 mg total) by mouth daily with breakfast. [name], DO Taking Active   montelukast (SINGULAIR) 10 MG tablet [id number] Yes Take 1 tablet (10 mg total) by  mouth at bedtime. For sinus pain. Marcial Pacas, DO Taking Active   pegfilgrastim-bmez [facility]) 6 MG/0.6ML injection [id number] Yes Inject into the skin. [provider] Taking Active   pravastatin (PRAVACHOL) 20 MG tablet [id number] Yes TAKE 1 TABLET BY MOUTH EVERY DAY [name], DO Taking Active   predniSONE (DELTASONE) 20 MG tablet [id number] Yes Take 1 tablet (20 mg total) by mouth 2 (two) times daily with a meal. [name], DO Taking Active   sildenafil (REVATIO) 20 MG tablet [id number] Yes Take 1-3 tablets (20-60 mg total) by mouth daily as needed (sex). Marcial Pacas, DO Taking Active   sildenafil (VIAGRA) 100 MG tablet [id number] Yes Take by mouth at bedtime. [provider] Taking Active             Patient Active Problem List   Diagnosis Date Noted   Type 2 diabetes mellitus with other specified complication ([place]) 88/87/5797   Diffuse large B-cell lymphoma of solid  organ excluding spleen ([place]) 06/24/2020   Marginal zone lymphoma (HCC) 06/24/2020   Statin intolerance 11/01/2017   Abscess of scalp 07/05/2017   Lipoma of neck 10/12/2016   Cervical strain 10/12/2016   Ingrowing toenail 08/14/2016   History of prostate cancer 06/11/2014   Colon polyp 10/30/2013   TMJ syndrome 02/11/2013   Glaucoma 05/02/2012   Irregular heartbeat (Declines Workup 09/2011 [facility]) 05/02/2012   Erectile dysfunction 05/02/2012   Essential hypertension 04/17/2012   Left rotator cuff tear 04/17/2012   Sciatica 04/17/2012    Immunization History  Administered Date(s) Administered   Fluad Quad(high Dose 65+) 04/03/2019, 05/13/2020   Influenza Split 04/17/2012   Influenza, High Dose Seasonal PF 04/24/2014, 04/15/2015, 02/23/2016, 03/25/2016, 04/05/2017, 05/25/2017, 05/02/2018, 05/25/2018   Influenza,inj,Quad PF,6+ Mos 04/24/2013, 03/25/2014, 04/15/2015, 03/09/2016   Influenza-Unspecified 07/12/2011, 05/26/2019, 05/25/2020   Moderna Sars-Covid-2 Vaccination 08/31/2019, 09/28/2019, 05/20/2020, 11/13/2020   Pneumococcal Conjugate-13 04/24/2013, 09/01/2015   Pneumococcal Polysaccharide-23 03/23/2012   Tdap 04/15/2015   Zoster Recombinat (Shingrix) 08/23/2018, 01/13/2019   Zoster, Live 10/30/2013    Conditions to be addressed/monitored: HTN, HLD, and DMII  There are no care plans that you recently modified to display for this patient.   Medication Assistance: None required.  Patient affirms current coverage meets needs.  Patient's [facility] pharmacy [facility]  [id number] [place] [id number]: [phone]: [phone] Uses pill box? No - leaves in original bottles, works well for him  Pt endorses 90% compliance  Follow Up:  Patient agrees to Care Plan and Follow-up.  Plan: Telephone follow up appointment with care management team member scheduled for:  1 month  KDarius Bump

## 2021-03-02 NOTE — Patient Instructions (Signed)
Visit Information   PATIENT GOALS:   Goals Addressed             This Visit's Progress    Medication Management       Patient Goals/Self-Care Activities Over the next 30 days, patient will:  take medications as prescribed  Follow Up Plan: Telephone follow up appointment with care management team member scheduled for:  1 month         Consent to CCM Services: Mr. Sarver was given information about Chronic Care Management services including:  CCM service includes personalized support from designated clinical staff supervised by his physician, including individualized plan of care and coordination with other care providers 24/7 contact phone numbers for assistance for urgent and routine care needs. Service will only be billed when office clinical staff spend 20 minutes or more in a month to coordinate care. Only one practitioner may furnish and bill the service in a calendar month. The patient may stop CCM services at any time (effective at the end of the month) by phone call to the office staff. The patient will be responsible for cost sharing (co-pay) of up to 20% of the service fee (after annual deductible is met).  Patient agreed to services and verbal consent obtained.   The patient verbalized understanding of instructions, educational materials, and care plan provided today and agreed to receive a mailed copy of patient instructions, educational materials, and care plan.   Telephone follow up appointment with care management team member scheduled for: 1 month  [place]: Patient Care Plan: Medication Management     Problem Identified: DM, HTN, HLD      Long-Range Goal: Disease Progression Prevention   Start Date: 03/01/2021  This Visit's Progress: On track  Priority: High  Note:   Current Barriers:  None at present  Pharmacist Clinical Goal(s):  Over the next 30 days, patient will maintain control of chronic conditions as evidenced by  medication fill history, lab values, and vital signs  through collaboration with PharmD and provider.   Interventions: 1:1 collaboration with [name], DO regarding development and update of comprehensive plan of care as evidenced by provider attestation and co-signature Inter-disciplinary care team collaboration (see longitudinal plan of care) Comprehensive medication review performed; medication list updated in electronic medical record  Diabetes:  Controlled; current treatment:metformin 1g daily, patient states NOT on lantus, on toujeo or tresiba (pt unsure which), 12 units at bedtime; a1c 6.1  Current glucose readings: fasting glucose: 115-119  Denies hypoglycemic/hyperglycemic symptoms  Current meal patterns: to be discussed at future appts  Current exercise: still working  Recommended continue current regimen, may be able to optimize metformin and decrease/discontinue insulin in future,  Hypertension:  Controlled; current treatment:lisinopril 63m daily;   Current home readings: SBP 110-120s  Denies hypotensive/hypertensive symptoms  Recommended continue current regimen,  Hyperlipidemia:  Controlled; current treatment:pravastatin 245mdaily; LDL 50  Recommended continue current regimen  Patient Goals/Self-Care Activities Over the next 30 days, patient will:  take medications as prescribed  Follow Up Plan: Telephone follow up appointment with care management team member scheduled for:  1 month

## 2021-03-28 ENCOUNTER — Other Ambulatory Visit: Payer: Self-pay | Admitting: Osteopathic Medicine

## 2021-03-28 DIAGNOSIS — Z794 Long term (current) use of insulin: Secondary | ICD-10-CM

## 2021-03-28 DIAGNOSIS — E1169 Type 2 diabetes mellitus with other specified complication: Secondary | ICD-10-CM

## 2021-04-09 ENCOUNTER — Other Ambulatory Visit: Payer: Self-pay

## 2021-04-09 ENCOUNTER — Ambulatory Visit (INDEPENDENT_AMBULATORY_CARE_PROVIDER_SITE_OTHER): Payer: Medicare Other | Admitting: Pharmacist

## 2021-04-09 DIAGNOSIS — Z789 Other specified health status: Secondary | ICD-10-CM

## 2021-04-09 DIAGNOSIS — E1169 Type 2 diabetes mellitus with other specified complication: Secondary | ICD-10-CM

## 2021-04-09 DIAGNOSIS — I10 Essential (primary) hypertension: Secondary | ICD-10-CM

## 2021-04-09 DIAGNOSIS — Z794 Long term (current) use of insulin: Secondary | ICD-10-CM

## 2021-04-09 NOTE — Progress Notes (Signed)
Chronic Care Management Pharmacy Note  04/11/2021 Name:  LIJAH BOURQUE MRN:  [id number] DOB:  03/29/1946  Summary: addressed HTN, HLD, and primarily DM  Recommendations/Changes made from today's visit: none, pharmacist to investigate formulary for GLP1 for [facility].   Plan: f/u with pharmacist in 1 week  Subjective: MICHAEL WALRATH is an 75 y.o. year old male who is a primary patient of [name], DO.  The CCM team was consulted for assistance with disease management and care coordination needs.    Engaged with patient by telephone for follow up visit in response to provider referral for pharmacy case management and/or care coordination services.   Consent to Services:  The patient was given information about Chronic Care Management services, agreed to services, and gave verbal consent prior to initiation of services.  Please see initial visit note for detailed documentation.   Patient Care Team: [name], DO as PCP - General (Osteopathic Medicine) geoffry bond  as Consulting Physician (Glaucoma Ophthalmology) Darius Bump, [facility] as Pharmacist (Pharmacist)  Objective:  Lab Results  Component Value Date   CREATININE 1.20 (H) 12/18/2019   CREATININE 1.0 11/13/2019   CREATININE 1.0 10/09/2018    Lab Results  Component Value Date   HGBA1C 6.1 (A) 01/13/2021   Last diabetic Eye exam:  Lab Results  Component Value Date/Time   HMDIABEYEEXA Retinopathy (A) 05/31/2017 12:00 AM        Component Value Date/Time   CHOL 110 01/13/2021 0000   TRIG 84 01/13/2021 0000   HDL 44 01/13/2021 0000   CHOLHDL 2.5 01/13/2021 0000   VLDL 16 10/30/2013 1002   LDLCALC 50 01/13/2021 0000    Hepatic Function Latest Ref Rng & Units 12/18/2019 11/13/2019 10/09/2018  Total Protein 6.1 - 8.1 g/dL 6.9 - -  Albumin 3.5 - 5.0 - 3.9 -  AST 10 - 35 U/L 19 17 28   ALT 9 - 46 U/L 13 20 30   Alk Phosphatase 25 - 125 - 58 -  Total Bilirubin 0.2 - 1.2 mg/dL 1.0  - -  Bilirubin, Direct 0.01 - 0.4 - 0.3 -    Lab Results  Component Value Date/Time   TSH 2.10 12/18/2019 03:11 PM   TSH 1.98 11/13/2019 12:00 AM   TSH 2.34 04/05/2017 08:47 AM    CBC Latest Ref Rng & Units 12/18/2019 12/04/2019 11/13/2019  WBC 3.8 - 10.8 Thousand/uL 5.2 5.4 5.2  Hemoglobin 13.2 - 17.1 g/dL 12.8(L) 12.9(A) 12.9(A)  Hematocrit 38.5 - 50.0 % 39.6 40(A) 41  Platelets 140 - 400 Thousand/uL 141 159 135(A)    Lab Results  Component Value Date/Time   VD25OH 30.82 11/13/2019 12:00 AM   VD25OH 24 (L) 04/05/2017 08:47 AM    Social History   Tobacco Use  Smoking Status Former   Packs/day: 0.25   Years: 44.00   Pack years: 11.00   Types: Cigarettes   Quit date: 05/24/2007   Years since quitting: 13.8  Smokeless Tobacco Never   BP Readings from Last 3 Encounters:  01/13/21 116/60  10/14/20 (!) 107/59  07/10/20 (!) 144/77   Pulse Readings from Last 3 Encounters:  01/13/21 (!) 57  10/14/20 92  07/10/20 (!) 57   Wt Readings from Last 3 Encounters:  01/13/21 219 lb 0.6 oz (99.4 kg)  10/14/20 213 lb (96.6 kg)  07/10/20 234 lb 0.6 oz (106.2 kg)    Assessment: Review of patient past medical history, allergies, medications, health status, including review of consultants reports,  laboratory and other test data, was performed as part of comprehensive evaluation and provision of chronic care management services.   SDOH:  (Social Determinants of Health) assessments and interventions performed:    CCM Care Plan  Allergies  Allergen Reactions   Aspirin Other (See Comments), Itching and Rash    Gi bleeding   Clindamycin Itching   Clindamycin/Lincomycin     rash   Bactrim [Sulfamethoxazole-Trimethoprim] Itching and Rash    See progress note from Dr. [name] 07/19/2017. No obvious drug rash, questionable other allergy exposure    Medications Reviewed Today     Reviewed by Darius Bump, [facility] (Pharmacist) on 03/01/21 at 22  Med List Status: <None>    Medication Order Taking? Sig Documenting Provider Last Dose Status Informant  bimatoprost (LUMIGAN) 0.01 % SOLN [id number] Yes Place 1 drop into the right eye nightly. [provider] Taking Active   Blood Glucose Monitoring Suppl (ACCU-CHEK GUIDE) w/Device KIT [id number] Yes INJECT 1 STICK INTO THE SKIN 2 (TWO) TIMES DAILY WITH BREAKFAST AND LUNCH. DX E11.9 DM [name], DO Taking Active   cetirizine (ZYRTEC) 10 MG tablet [id number] Yes Take 10 mg by mouth daily. [provider] Taking Active   Cholecalciferol (VITAMIN D3) 125 MCG (5000 UT) CAPS [id number] Yes Take by mouth. [provider] Taking Active Self  dorzolamide-timolol (COSOPT) 22.3-6.8 MG/ML ophthalmic solution [id number] Yes Place 1 drop into the right eye 2 times daily. [provider] Taking Active   Exenatide ER (BYDUREON BCISE) 2 MG/0.85ML AUIJ [id number] No Inject 2 mg into the skin once a week.  Patient not taking: Reported on 03/01/2021   [name], DO Not Taking Active   glucose blood (ACCU-CHEK GUIDE) test strip [id number] Yes USE AS INSTRUCTED [name], DO Taking Active   insulin glargine (LANTUS) 100 UNIT/ML injection [id number]  Inject 0.12 mLs (12 Units total) into the skin at bedtime. [name], DO  Active   Lancets [place] [id number] Yes For use with glucometer qid as directed [name], DO Taking Active   lisinopril (PRINIVIL,ZESTRIL) 5 MG tablet [id number] Yes Take 1 tablet (5 mg total) by mouth daily. [name], DO Taking Active Self  metFORMIN (GLUCOPHAGE) 1000 MG tablet [id number] Yes Take 1 tablet (1,000 mg total) by mouth daily with breakfast. [name], DO Taking Active   montelukast (SINGULAIR) 10 MG tablet [id number] No Take 1 tablet (10 mg total) by mouth at bedtime. For sinus pain.  Patient not taking: Reported on 03/01/2021   Marcial Pacas, DO Not Taking Active   pegfilgrastim-bmez [facility]) 6 MG/0.6ML injection [id number] No  Inject into the skin.  Patient not taking: Reported on 03/01/2021   [provider] Not Taking Active   pravastatin (PRAVACHOL) 20 MG tablet [id number] Yes TAKE 1 TABLET BY MOUTH EVERY DAY [name], DO Taking Active   predniSONE (DELTASONE) 20 MG tablet [id number] No Take 1 tablet (20 mg total) by mouth 2 (two) times daily with a meal.  Patient not taking: Reported on 03/01/2021   [name], DO Not Taking Active   sildenafil (REVATIO) 20 MG tablet [id number] Yes Take 1-3 tablets (20-60 mg total) by mouth daily as needed (sex). Marcial Pacas, DO Taking Active   sildenafil (VIAGRA) 100 MG tablet [id number] Yes Take by mouth at bedtime. [provider] Taking Active             Patient Active Problem List   Diagnosis Date Noted   Type 2 diabetes mellitus with other specified complication ([place])  10/14/2020   Diffuse large B-cell lymphoma of solid organ excluding spleen ([place]) 06/24/2020   Marginal zone lymphoma ([place]) 06/24/2020   Statin intolerance 11/01/2017   Abscess of scalp 07/05/2017   Lipoma of neck 10/12/2016   Cervical strain 10/12/2016   Ingrowing toenail 08/14/2016   History of prostate cancer 06/11/2014   Colon polyp 10/30/2013   TMJ syndrome 02/11/2013   Glaucoma 05/02/2012   Irregular heartbeat (Declines Workup 09/2011 [facility]) 05/02/2012   Erectile dysfunction 05/02/2012   Essential hypertension 04/17/2012   Left rotator cuff tear 04/17/2012   Sciatica 04/17/2012    Immunization History  Administered Date(s) Administered   Fluad Quad(high Dose 65+) 04/03/2019, 05/13/2020   Influenza Split 04/17/2012   Influenza, High Dose Seasonal PF 04/24/2014, 04/15/2015, 02/23/2016, 03/25/2016, 04/05/2017, 05/25/2017, 05/02/2018, 05/25/2018   Influenza,inj,Quad PF,6+ Mos 04/24/2013, 03/25/2014, 04/15/2015, 03/09/2016   Influenza-Unspecified 07/12/2011, 05/26/2019, 05/25/2020   Moderna Sars-Covid-2 Vaccination 08/31/2019, 09/28/2019, 05/20/2020,  11/13/2020   Pneumococcal Conjugate-13 04/24/2013, 09/01/2015   Pneumococcal Polysaccharide-23 03/23/2012   Tdap 04/15/2015   Zoster Recombinat (Shingrix) 08/23/2018, 01/13/2019   Zoster, Live 10/30/2013    Conditions to be addressed/monitored: HTN, HLD, and DMII  Care Plan : Medication Management  Updates made by Darius Bump, [place] since 04/11/2021 12:00 AM     Problem: DM, HTN, HLD      Long-Range Goal: Disease Progression Prevention   Start Date: 03/01/2021  Recent Progress: On track  Priority: High  Note:   Current Barriers:  None at present  Pharmacist Clinical Goal(s):  Over the next 7 days, patient will maintain control of chronic conditions as evidenced by medication fill history, lab values, and vital signs  through collaboration with PharmD and provider.   Interventions: 1:1 collaboration with [name], DO regarding development and update of comprehensive plan of care as evidenced by provider attestation and co-signature Inter-disciplinary care team collaboration (see longitudinal plan of care) Comprehensive medication review performed; medication list updated in electronic medical record  Diabetes:  Controlled; current treatment:metformin 1g daily, patient on lantus 12 units at bedtime; a1c 6.1  Current glucose readings: fasting glucose: 115-119  Denies hypoglycemic/hyperglycemic symptoms  Current meal patterns: to be discussed at future appts  Current exercise: still working  Recommended continue current regimen, may be able to optimize metformin and decrease/discontinue insulin in future, pharmacist to check with [place] for formulary coverage of GLP1s.  Hypertension:  Controlled; current treatment:lisinopril 5m daily;   Current home readings: SBP 110-120s  Denies hypotensive/hypertensive symptoms  Recommended continue current regimen,  Hyperlipidemia:  Controlled; current treatment:pravastatin 24mdaily; LDL 50  Recommended  continue current regimen  Patient Goals/Self-Care Activities Over the next 7 days, patient will:  take medications as prescribed  Follow Up Plan: Telephone follow up appointment with care management team member scheduled for:  1 week     Medication Assistance: None required.  Patient affirms current coverage meets needs.  [facility]  [id number] [place]: [phone]: [phone] Uses pill box? No - leaves in original bottles, works well for him  Pt endorses 90% compliance  Follow Up:  Patient agrees to Care Plan and Follow-up.  Plan: Telephone follow up appointment with care management team member scheduled for:  1 week  KeeDarius Bump

## 2021-04-11 NOTE — Patient Instructions (Signed)
Visit Information  PATIENT GOALS:  Goals Addressed             This Visit's Progress    Medication Management       Patient Goals/Self-Care Activities Over the next 7 days, patient will:  take medications as prescribed  Follow Up Plan: Telephone follow up appointment with care management team member scheduled for:  1 week        The patient verbalized understanding of instructions, educational materials, and care plan provided today and agreed to receive a mailed copy of patient instructions, educational materials, and care plan.   Telephone follow up appointment with care management team member scheduled for: 1 week  Darius Bump

## 2021-04-15 ENCOUNTER — Telehealth: Payer: Medicare Other

## 2021-04-16 ENCOUNTER — Telehealth: Payer: Self-pay | Admitting: Pharmacist

## 2021-04-16 NOTE — Telephone Encounter (Signed)
Unsuccessful attempt x2 to contact patient for follow-up phone call for CCM services with pharmacist.  Will route to schedule team for reschedule.

## 2021-04-19 NOTE — Chronic Care Management (AMB) (Signed)
  Care Management   Note  04/19/2021 Name: Calvin Wise MRN: [id number] DOB: [birth year]  Calvin Wise is a 75 y.o. year old male who is a primary care patient of [name], DO and is actively engaged with the care management team. I reached out to Reinaldo Berber by phone today to assist with re-scheduling a follow up visit with the Pharmacist  Follow up plan: Telephone appointment with care management team member scheduled for: 04/26/2021  [name], [place], [place] Management  Direct Dial: [phone]

## 2021-04-19 NOTE — Chronic Care Management (AMB) (Signed)
  Care Management   Note  04/19/2021 Name: Calvin Wise MRN: [id number] DOB: May 20, 1946  Calvin Wise is a 75 y.o. year old male who is a primary care patient of [name], DO and is actively engaged with the care management team. I reached out to Calvin Wise by phone today to assist with re-scheduling a follow up visit with the Pharmacist  Follow up plan: Unsuccessful telephone outreach attempt made. A HIPAA compliant phone message was left for the patient providing contact information and requesting a return call.   [name], [place] Management  Direct Dial: [phone]

## 2021-04-23 DIAGNOSIS — E1169 Type 2 diabetes mellitus with other specified complication: Secondary | ICD-10-CM

## 2021-04-23 DIAGNOSIS — I10 Essential (primary) hypertension: Secondary | ICD-10-CM | POA: Diagnosis not present

## 2021-04-23 DIAGNOSIS — Z794 Long term (current) use of insulin: Secondary | ICD-10-CM | POA: Diagnosis not present

## 2021-04-26 ENCOUNTER — Telehealth: Payer: Medicare Other

## 2021-04-26 DIAGNOSIS — C8264 Cutaneous follicle center lymphoma, lymph nodes of axilla and upper limb: Secondary | ICD-10-CM | POA: Insufficient documentation

## 2021-04-26 DIAGNOSIS — C61 Malignant neoplasm of prostate: Secondary | ICD-10-CM | POA: Insufficient documentation

## 2021-04-26 DIAGNOSIS — H269 Unspecified cataract: Secondary | ICD-10-CM | POA: Insufficient documentation

## 2021-04-26 DIAGNOSIS — L97509 Non-pressure chronic ulcer of other part of unspecified foot with unspecified severity: Secondary | ICD-10-CM | POA: Insufficient documentation

## 2021-04-26 DIAGNOSIS — M869 Osteomyelitis, unspecified: Secondary | ICD-10-CM | POA: Insufficient documentation

## 2021-05-17 ENCOUNTER — Ambulatory Visit: Payer: Medicare Other | Admitting: Osteopathic Medicine

## 2021-05-17 ENCOUNTER — Ambulatory Visit (INDEPENDENT_AMBULATORY_CARE_PROVIDER_SITE_OTHER): Payer: Self-pay | Admitting: Medical-Surgical

## 2021-05-17 DIAGNOSIS — Z91199 Patient's noncompliance with other medical treatment and regimen due to unspecified reason: Secondary | ICD-10-CM

## 2021-05-17 NOTE — Progress Notes (Signed)
   Complete physical exam  Patient: Calvin Wise   DOB: 05/14/1999   75 y.o. Male  MRN: [id number]  Subjective:    No chief complaint on file.   Calvin Wise is a 75 y.o. male who presents today for a complete physical exam. She reports consuming a {diet types:17450} diet. {types:19826} She generally feels {DESC; WELL/FAIRLY WELL/POORLY:18703}. She reports sleeping {DESC; WELL/FAIRLY WELL/POORLY:18703}. She {does/does not:200015} have additional problems to discuss today.    Most recent fall risk assessment:    01/19/2022   10:42 AM  Fall Risk   Falls in the past year? 0  Number falls in past yr: 0  Injury with Fall? 0  Risk for fall due to : No Fall Risks  Follow up Falls evaluation completed     Most recent depression screenings:    01/19/2022   10:42 AM 12/10/2020   10:46 AM  PHQ 2/9 Scores  PHQ - 2 Score 0 0  PHQ- 9 Score 5     {VISON DENTAL STD PSA (Optional):27386}  {History (Optional):23778}  Patient Care Team: Bernadetta Roell, NP as PCP - General (Nurse Practitioner)   Outpatient Medications Prior to Visit  Medication Sig   fluticasone (FLONASE) 50 MCG/ACT nasal spray Place 2 sprays into both nostrils in the morning and at bedtime. After 7 days, reduce to once daily.   norgestimate-ethinyl estradiol (SPRINTEC 28) 0.25-35 MG-MCG tablet Take 1 tablet by mouth daily.   Nystatin POWD Apply liberally to affected area 2 times per day   spironolactone (ALDACTONE) 100 MG tablet Take 1 tablet (100 mg total) by mouth daily.   No facility-administered medications prior to visit.    ROS        Objective:     There were no vitals taken for this visit. {Vitals History (Optional):23777}  Physical Exam   No results found for any visits on 02/24/22. {Show previous labs (optional):23779}    Assessment & Plan:    Routine Health Maintenance and Physical Exam  Immunization History  Administered Date(s) Administered   DTaP 07/28/1999, 09/23/1999,  12/02/1999, 08/17/2000, 03/02/2004   Hepatitis A 12/28/2007, 01/02/2009   Hepatitis B 05/15/1999, 06/22/1999, 12/02/1999   HiB (PRP-OMP) 07/28/1999, 09/23/1999, 12/02/1999, 08/17/2000   IPV 07/28/1999, 09/23/1999, 05/22/2000, 03/02/2004   Influenza,inj,Quad PF,6+ Mos 04/04/2014   Influenza-Unspecified 07/04/2012   MMR 05/22/2001, 03/02/2004   Meningococcal Polysaccharide 01/02/2012   Pneumococcal Conjugate-13 08/17/2000   Pneumococcal-Unspecified 12/02/1999, 02/15/2000   Tdap 01/02/2012   Varicella 05/22/2000, 12/28/2007    Health Maintenance  Topic Date Due   HIV Screening  Never done   Hepatitis C Screening  Never done   INFLUENZA VACCINE  02/22/2022   PAP-Cervical Cytology Screening  02/24/2022 (Originally 05/13/2020)   PAP SMEAR-Modifier  02/24/2022 (Originally 05/13/2020)   TETANUS/TDAP  02/24/2022 (Originally 01/01/2022)   HPV VACCINES  Discontinued   COVID-19 Vaccine  Discontinued    Discussed health benefits of physical activity, and encouraged her to engage in regular exercise appropriate for her age and condition.  Problem List Items Addressed This Visit   None Visit Diagnoses     Annual physical exam    -  Primary   Cervical cancer screening       Need for Tdap vaccination          No follow-ups on file.     Nolyn Swab, NP   

## 2021-05-18 ENCOUNTER — Ambulatory Visit (INDEPENDENT_AMBULATORY_CARE_PROVIDER_SITE_OTHER): Payer: Medicare Other | Admitting: Pharmacist

## 2021-05-18 ENCOUNTER — Other Ambulatory Visit: Payer: Self-pay

## 2021-05-18 DIAGNOSIS — E1169 Type 2 diabetes mellitus with other specified complication: Secondary | ICD-10-CM

## 2021-05-18 DIAGNOSIS — Z794 Long term (current) use of insulin: Secondary | ICD-10-CM

## 2021-05-18 DIAGNOSIS — Z789 Other specified health status: Secondary | ICD-10-CM

## 2021-05-18 DIAGNOSIS — I10 Essential (primary) hypertension: Secondary | ICD-10-CM

## 2021-05-18 NOTE — Progress Notes (Signed)
Chronic Care Management Pharmacy Note  05/20/2021 Name:  Calvin Wise MRN:  [id number] DOB:  [birth year]  Summary: addressed HTN, HLD, and primarily DM  Recommendations/Changes made from today's visit: Begin ozempic 0.55m weekly. Patient will come to office appt with pharmacist next week to begin ozempic, review injection technique & new medication, with the goal of titrating off of insulin.   Plan: f/u with pharmacist in 1 week  Subjective: Calvin HYSLOPis an 75y.o. year old male who is a primary patient of AEmeterio Reeve DO.  The CCM team was consulted for assistance with disease management and care coordination needs.    Engaged with patient by telephone for follow up visit in response to provider referral for pharmacy case management and/or care coordination services.   Consent to Services:  The patient was given information about Chronic Care Management services, agreed to services, and gave verbal consent prior to initiation of services.  Please see initial visit note for detailed documentation.   Patient Care Team: AEmeterio Reeve DO as PCP - General (Osteopathic Medicine) geoffry bond  as Consulting Physician (Glaucoma Ophthalmology) KDarius Bump [facility] ([facility]armac[facility])  Objective:  Lab Results  Component Value Date   CREATININE 1.20 (H) 12/18/2019   CREATININE 1.0 11/13/2019   CREATININE 1.0 10/09/2018    Lab Results  Component Value Date   HGBA1C 6.1 (A) 01/13/2021   Last diabetic Eye exam:  Lab Results  Component Value Date/Time   HMDIABEYEEXA Retinopathy (A) 05/31/2017 12:00 AM        Component Value Date/Time   CHOL 110 01/13/2021 0000   TRIG 84 01/13/2021 0000   HDL 44 01/13/2021 0000   CHOLHDL 2.5 01/13/2021 0000   VLDL 16 10/30/2013 1002   LDLCALC 50 01/13/2021 0000    Hepatic Function Latest Ref Rng & Units 12/18/2019 11/13/2019 10/09/2018  Total Protein 6.1 - 8.1 g/dL 6.9 - -  Albumin 3.5 - 5.0 - 3.9 -  AST 10 -  35 U/L _0 ALT 9 - 46 U/L _1 Alk Phosphatase 25 - 125 - 58 -  Total Bilirubin 0.2 - 1.2 mg/dL 1.0 - -  Bilirubin, Direct 0.01 - 0.4 - 0.3 -    Lab Results  Component Value Date/Time   TSH 2.10 12/18/2019 03:11 PM   TSH 1.98 11/13/2019 12:00 AM   TSH 2.34 04/05/2017 08:47 AM    CBC Latest Ref Rng & Units 12/18/2019 12/04/2019 11/13/2019  WBC 3.8 - 10.8 Thousand/uL 5.2 5.4 5.2  Hemoglobin 13.2 - 17.1 g/dL 12.8(L) 12.9(A) 12.9(A)  Hematocrit 38.5 - 50.0 % 39.6 40(A) 41  Platelets 140 - 400 Thousand/uL 141 159 135(A)    Lab Results  Component Value Date/Time   VD25OH 30.82 11/13/2019 12:00 AM   VD25OH 24 (L) 04/05/2017 08:47 AM    Social History   Tobacco Use  Smoking Status Former   Packs/day: 0.25   Years: 44.00   Pack years: 11.00   Types: Cigarettes   Quit date: 05/24/2007   Years since quitting: 14.0  Smokeless Tobacco Never   BP Readings from Last 3 Encounters:  01/13/21 116/60  10/14/20 (!) 107/59  07/10/20 (!) 144/77   Pulse Readings from Last 3 Encounters:  01/13/21 (!) 57  10/14/20 92  07/10/20 (!) 57   Wt Readings from Last 3 Encounters:  01/13/21 219 lb 0.6 oz (99.4 kg)  10/14/20 213 lb (96.6 kg)  07/10/20 234 lb 0.6 oz (106.2  kg)    Assessment: Review of patient past medical history, allergies, medications, health status, including review of consultants reports, laboratory and other test data, was performed as part of comprehensive evaluation and provision of chronic care management services.   SDOH:  (Social Determinants of Health) assessments and interventions performed:    CCM Care Plan  Allergies  Allergen Reactions   Aspirin Other (See Comments), Itching and Rash    Gi bleeding   Clindamycin Itching   Clindamycin/Lincomycin     rash   Bactrim [Sulfamethoxazole-Trimethoprim] Itching and Rash    See progress note from Dr. [name] 07/19/2017. No obvious drug rash, questionable other allergy exposure    Medications  Reviewed Today     Reviewed by Darius Bump, [facility] (Pharmacist) on 05/18/21 at [place] List Status: <None>   Medication Order Taking? Sig Documenting Provider Last Dose Status Informant  bimatoprost (LUMIGAN) 0.01 % SOLN [id number] Yes Place 1 drop into the right eye nightly. [provider] Taking Active   Blood Glucose Monitoring Suppl (ACCU-CHEK GUIDE) w/Device KIT [id number] Yes INJECT 1 STICK INTO THE SKIN 2 (TWO) TIMES DAILY WITH BREAKFAST AND LUNCH. DX E11.9 DM [name], DO Taking Active   cetirizine (ZYRTEC) 10 MG tablet [id number] Yes Take 10 mg by mouth daily. [provider] Taking Active   Cholecalciferol (VITAMIN D3) 125 MCG (5000 UT) CAPS [id number] Yes Take by mouth. [provider] Taking Active Self  dorzolamide-timolol (COSOPT) 22.3-6.8 MG/ML ophthalmic solution [id number] Yes Place 1 drop into the right eye 2 times daily. [provider] Taking Active   Exenatide ER (BYDUREON BCISE) 2 MG/0.85ML AUIJ [id number]  Inject 2 mg into the skin once a week.  Patient not taking: No sig reported   [name], DO  Active   glucose blood (ACCU-CHEK GUIDE) test strip [id number]  USE AS INSTRUCTED [name], DO  Active   insulin glargine (LANTUS) 100 UNIT/ML injection [id number] Yes Inject 0.12 mLs (12 Units total) into the skin at bedtime. [name], DO Taking Active   Lancets Dickinson [id number] Yes For use with glucometer qid as directed [name], DO Taking Active   lisinopril (PRINIVIL,ZESTRIL) 5 MG tablet [id number] Yes Take 1 tablet (5 mg total) by mouth daily. [name], DO Taking Active Self  metFORMIN (GLUCOPHAGE) 1000 MG tablet [id number] Yes TAKE 1 TABLET BY MOUTH EVERY DAY WITH BREAKFAST [name], DO Taking Active   montelukast (SINGULAIR) 10 MG tablet [id number] Yes Take 1 tablet (10 mg total) by mouth at bedtime. For sinus pain. Marcial Pacas, DO Taking Active   pegfilgrastim-bmez Tyson Dense) 6  MG/0.6ML injection [id number] No Inject into the skin.  Patient not taking: Reported on 05/18/2021   [provider] Not Taking Active   pravastatin (PRAVACHOL) 20 MG tablet [id number] Yes TAKE 1 TABLET BY MOUTH EVERY DAY [name], DO Taking Active   predniSONE (DELTASONE) 20 MG tablet [id number] No Take 1 tablet (20 mg total) by mouth 2 (two) times daily with a meal.  Patient not taking: No sig reported   [name], DO Not Taking Active   sildenafil (REVATIO) 20 MG tablet [id number] Yes Take 1-3 tablets (20-60 mg total) by mouth daily as needed (sex). Marcial Pacas, DO Taking Active   sildenafil (VIAGRA) 100 MG tablet [id number]  Take by mouth at bedtime. [provider]  Active             Patient Active Problem List   Diagnosis Date Noted   Type 2  diabetes mellitus with other specified complication ([place]) 25/36/6440   Diffuse large B-cell lymphoma of solid organ excluding spleen ([place]) 06/24/2020   Marginal zone lymphoma ([place]) 06/24/2020   Statin intolerance 11/01/2017   Abscess of scalp 07/05/2017   Lipoma of neck 10/12/2016   Cervical strain 10/12/2016   Ingrowing toenail 08/14/2016   History of prostate cancer 06/11/2014   Colon polyp 10/30/2013   TMJ syndrome 02/11/2013   Glaucoma 05/02/2012   Irregular heartbeat (Declines Workup 09/2011 [facility]) 05/02/2012   Erectile dysfunction 05/02/2012   Essential hypertension 04/17/2012   Left rotator cuff tear 04/17/2012   Sciatica 04/17/2012    Immunization History  Administered Date(s) Administered   Fluad Quad(high Dose 65+) 04/03/2019, 05/13/2020   Influenza Split 04/17/2012   Influenza, High Dose Seasonal PF 04/24/2014, 04/15/2015, 02/23/2016, 03/25/2016, 04/05/2017, 05/25/2017, 05/02/2018, 05/25/2018   Influenza,inj,Quad PF,6+ Mos 04/24/2013, 03/25/2014, 04/15/2015, 03/09/2016   Influenza-Unspecified 07/12/2011, 05/26/2019, 05/25/2020   Moderna Sars-Covid-2 Vaccination 08/31/2019,  09/28/2019, 05/20/2020, 11/13/2020   Pneumococcal Conjugate-13 04/24/2013, 09/01/2015   Pneumococcal Polysaccharide-23 03/23/2012   Tdap 04/15/2015   Zoster Recombinat (Shingrix) 08/23/2018, 01/13/2019   Zoster, Live 10/30/2013    Conditions to be addressed/monitored: HTN, HLD, and DMII  Care Plan : Medication Management  Updates made by Darius Bump, [facility] since 05/20/2021 12:00 AM     Problem: DM, HTN, HLD      Long-Range Goal: Disease Progression Prevention   Start Date: 03/01/2021  Recent Progress: On track  Priority: High  Note:   Current Barriers:  None at present  Pharmacist Clinical Goal(s):  Over the next 7 days, patient will maintain control of chronic conditions as evidenced by medication fill history, lab values, and vital signs  through collaboration with PharmD and provider.   Interventions: 1:1 collaboration with [name], DO regarding development and update of comprehensive plan of care as evidenced by provider attestation and co-signature Inter-disciplinary care team collaboration (see longitudinal plan of care) Comprehensive medication review performed; medication list updated in electronic medical record  Diabetes:  Controlled; current treatment:metformin 1g daily, patient on lantus 12 units at bedtime; a1c 6.1  Current glucose readings: fasting glucose: 115-119  Denies hypoglycemic/hyperglycemic symptoms  Current meal patterns: to be discussed at future appts  Current exercise: still working  Recommended continue current regimen, will initiate ozempic with goal of titrating off of insulin Hypertension:  Controlled; current treatment:lisinopril 73m daily;   Current home readings: SBP 110-120s  Denies hypotensive/hypertensive symptoms  Recommended continue current regimen,  Hyperlipidemia:  Controlled; current treatment:pravastatin 27mdaily; LDL 50  Recommended continue current regimen  Patient Goals/Self-Care Activities Over the next 7  days, patient will:  take medications as prescribed  Follow Up Plan: Face to face follow up appointment with care management team member scheduled for:  1 week      Medication Assistance: None required.  Patient affirms current coverage meets needs.  [facility]  [id number] [address] [place] [place] [id number]: [phone]: [phone] Uses pill box? No - leaves in original bottles, works well for him  Pt endorses 90% compliance  Follow Up:  Patient agrees to Care Plan and Follow-up.  Plan: Face to Face appointment with care management team member scheduled for: 1 week  KeeDarius Bump

## 2021-05-20 NOTE — Patient Instructions (Signed)
Visit Information  PATIENT GOALS:  Goals Addressed             This Visit's Progress    Medication Management       Patient Goals/Self-Care Activities Over the next 7 days, patient will:  take medications as prescribed  Follow Up Plan: Face to face follow up appointment with care management team member scheduled for:  1 week        The patient verbalized understanding of instructions, educational materials, and care plan provided today and agreed to receive a mailed copy of patient instructions, educational materials, and care plan.   Face to Face appointment with care management team member scheduled for:  1 week  Calvin Wise

## 2021-05-24 DIAGNOSIS — I10 Essential (primary) hypertension: Secondary | ICD-10-CM | POA: Diagnosis not present

## 2021-05-24 DIAGNOSIS — E1169 Type 2 diabetes mellitus with other specified complication: Secondary | ICD-10-CM | POA: Diagnosis not present

## 2021-05-24 DIAGNOSIS — Z794 Long term (current) use of insulin: Secondary | ICD-10-CM | POA: Diagnosis not present

## 2021-05-26 ENCOUNTER — Ambulatory Visit (INDEPENDENT_AMBULATORY_CARE_PROVIDER_SITE_OTHER): Payer: Medicare Other | Admitting: Pharmacist

## 2021-05-26 DIAGNOSIS — E1169 Type 2 diabetes mellitus with other specified complication: Secondary | ICD-10-CM

## 2021-05-26 DIAGNOSIS — Z789 Other specified health status: Secondary | ICD-10-CM

## 2021-05-26 DIAGNOSIS — I10 Essential (primary) hypertension: Secondary | ICD-10-CM

## 2021-05-26 NOTE — Progress Notes (Signed)
Chronic Care Management Pharmacy Note  05/27/2021 Name:  Calvin Wise MRN:  [id number] DOB:  17-Oct-1945  Summary: addressed HTN, HLD, and primarily DM. Reviewed injection technique and dosing for new ozempic.  Recommendations/Changes made from today's visit: Begin ozempic 0.4m weekly x 4 weeks, then increase to 0.541mweekly if tolerating medicine well.   Plan: f/u with pharmacist in 3-4 weeks  Subjective: Calvin Wise an [age].o. year old male who is a primary patient of AlEmeterio ReeveDO.  The CCM team was consulted for assistance with disease management and care coordination needs.    Engaged with patient face to face for follow up visit in response to provider referral for pharmacy case management and/or care coordination services.   Consent to Services:  The patient was given information about Chronic Care Management services, agreed to services, and gave verbal consent prior to initiation of services.  Please see initial visit note for detailed documentation.   Patient Care Team: AlEmeterio ReeveDO as PCP - General (Osteopathic Medicine) geoffry bond  as Consulting Physician (Glaucoma Ophthalmology) KlDarius BumpRPKindred Hospital [facility]s Pharmacist (Pharmacist)  Objective:  Lab Results  Component Value Date   CREATININE 1.20 (H) 12/18/2019   CREATININE 1.0 11/13/2019   CREATININE 1.0 10/09/2018    Lab Results  Component Value Date   HGBA1C 6.1 (A) 01/13/2021   Last diabetic Eye exam:  Lab Results  Component Value Date/Time   HMDIABEYEEXA Retinopathy (A) 05/31/2017 12:00 AM        Component Value Date/Time   CHOL 110 01/13/2021 0000   TRIG 84 01/13/2021 0000   HDL 44 01/13/2021 0000   CHOLHDL 2.5 01/13/2021 0000   VLDL 16 10/30/2013 1002   LDLCALC 50 01/13/2021 0000    Hepatic Function Latest Ref Rng & Units 12/18/2019 11/13/2019 10/09/2018  Total Protein 6.1 - 8.1 g/dL 6.9 - -  Albumin 3.5 - 5.0 - 3.9 -  AST 10 - 35 U/L 19 17 28   ALT 9 - 46 U/L 13  20 30   Alk Phosphatase 25 - 125 - 58 -  Total Bilirubin 0.2 - 1.2 mg/dL 1.0 - -  Bilirubin, Direct 0.01 - 0.4 - 0.3 -    Lab Results  Component Value Date/Time   TSH 2.10 12/18/2019 03:11 PM   TSH 1.98 11/13/2019 12:00 AM   TSH 2.34 04/05/2017 08:47 AM    CBC Latest Ref Rng & Units 12/18/2019 12/04/2019 11/13/2019  WBC 3.8 - 10.8 Thousand/uL 5.2 5.4 5.2  Hemoglobin 13.2 - 17.1 g/dL 12.8(L) 12.9(A) 12.9(A)  Hematocrit 38.5 - 50.0 % 39.6 40(A) 41  Platelets 140 - 400 Thousand/uL 141 159 135(A)    Lab Results  Component Value Date/Time   VD25OH 30.82 11/13/2019 12:00 AM   VD25OH 24 (L) 04/05/2017 08:47 AM    Social History   Tobacco Use  Smoking Status Former   Packs/day: 0.25   Years: 44.00   Pack years: 11.00   Types: Cigarettes   Quit date: 05/24/2007   Years since quitting: 14.0  Smokeless Tobacco Never   BP Readings from Last 3 Encounters:  01/13/21 116/60  10/14/20 (!) 107/59  07/10/20 (!) 144/77   Pulse Readings from Last 3 Encounters:  01/13/21 (!) 57  10/14/20 92  07/10/20 (!) 57   Wt Readings from Last 3 Encounters:  01/13/21 219 lb 0.6 oz (99.4 kg)  10/14/20 213 lb (96.6 kg)  07/10/20 234 lb 0.6 oz (106.2 kg)    Assessment: Review  of patient past medical history, allergies, medications, health status, including review of consultants reports, laboratory and other test data, was performed as part of comprehensive evaluation and provision of chronic care management services.   SDOH:  (Social Determinants of Health) assessments and interventions performed:    CCM Care Plan  Allergies  Allergen Reactions   Aspirin Other (See Comments), Itching and Rash    Gi bleeding   Clindamycin Itching   Clindamycin/Lincomycin     rash   Bactrim [Sulfamethoxazole-Trimethoprim] Itching and Rash    See progress note from Dr. [name] 07/19/2017. No obvious drug rash, questionable other allergy exposure    Medications Reviewed Today     Reviewed by Darius Bump, [facility] (Pharmacist) on 05/26/21 at 1328  Med List Status: <None>   Medication Order Taking? Sig Documenting Provider Last Dose Status Informant  bimatoprost (LUMIGAN) 0.01 % SOLN [id number] Yes Place 1 drop into the right eye nightly. [provider] Taking Active   Blood Glucose Monitoring Suppl (ACCU-CHEK GUIDE) w/Device KIT [id number] Yes INJECT 1 STICK INTO THE SKIN 2 (TWO) TIMES DAILY WITH BREAKFAST AND LUNCH. DX E11.9 DM [name], DO Taking Active   cetirizine (ZYRTEC) 10 MG tablet [id number] Yes Take 10 mg by mouth daily. [provider] Taking Active   Cholecalciferol (VITAMIN D3) 125 MCG (5000 UT) CAPS [id number] Yes Take by mouth. [provider] Taking Active Self  dorzolamide-timolol (COSOPT) 22.3-6.8 MG/ML ophthalmic solution [id number] Yes Place 1 drop into the right eye 2 times daily. [provider] Taking Active   glucose blood (ACCU-CHEK GUIDE) test strip [id number] Yes USE AS INSTRUCTED [name], DO Taking Active   HYDROcodone-acetaminophen (NORCO/VICODIN) 5-325 MG tablet [id number] Yes Take 1 tablet by mouth 3 (three) times daily as needed for moderate pain. [provider] Taking Active   insulin glargine (LANTUS) 100 UNIT/ML injection [id number] Yes Inject 0.12 mLs (12 Units total) into the skin at bedtime. [name], DO Taking Active   Lancets [place] [id number] Yes For use with glucometer qid as directed [name], DO Taking Active   lisinopril (PRINIVIL,ZESTRIL) 5 MG tablet [id number] Yes Take 1 tablet (5 mg total) by mouth daily. [name], DO Taking Active Self  metFORMIN (GLUCOPHAGE) 1000 MG tablet [id number] Yes TAKE 1 TABLET BY MOUTH EVERY DAY WITH BREAKFAST [name], DO Taking Active   montelukast (SINGULAIR) 10 MG tablet [id number] Yes Take 1 tablet (10 mg total) by mouth at bedtime. For sinus pain. Marcial Pacas, DO Taking Active   pravastatin (PRAVACHOL) 20 MG tablet [id number]  Yes TAKE 1 TABLET BY MOUTH EVERY DAY [name], DO Taking Active   sildenafil (REVATIO) 20 MG tablet [id number] Yes Take 1-3 tablets (20-60 mg total) by mouth daily as needed (sex). Marcial Pacas, DO Taking Active             Patient Active Problem List   Diagnosis Date Noted   Type 2 diabetes mellitus with other specified complication ([place]) 34/19/6222   Diffuse large B-cell lymphoma of solid organ excluding spleen ([place]) 06/24/2020   Marginal zone lymphoma ([place]) 06/24/2020   Statin intolerance 11/01/2017   Abscess of scalp 07/05/2017   Lipoma of neck 10/12/2016   Cervical strain 10/12/2016   Ingrowing toenail 08/14/2016   History of prostate cancer 06/11/2014   Colon polyp 10/30/2013   TMJ syndrome 02/11/2013   Glaucoma 05/02/2012   Irregular heartbeat (Declines Workup 09/2011 [facility]) 05/02/2012   Erectile dysfunction 05/02/2012   Essential hypertension 04/17/2012  Left rotator cuff tear 04/17/2012   Sciatica 04/17/2012    Immunization History  Administered Date(s) Administered   Fluad Quad(high Dose 65+) 04/03/2019, 05/13/2020   Influenza Split 04/17/2012   Influenza, High Dose Seasonal PF 04/24/2014, 04/15/2015, 02/23/2016, 03/25/2016, 04/05/2017, 05/25/2017, 05/02/2018, 05/25/2018   Influenza,inj,Quad PF,6+ Mos 04/24/2013, 03/25/2014, 04/15/2015, 03/09/2016   Influenza-Unspecified 07/12/2011, 05/26/2019, 05/25/2020   Moderna Sars-Covid-2 Vaccination 08/31/2019, 09/28/2019, 05/20/2020, 11/13/2020   Pneumococcal Conjugate-13 04/24/2013, 09/01/2015   Pneumococcal Polysaccharide-23 03/23/2012   Tdap 04/15/2015   Zoster Recombinat (Shingrix) 08/23/2018, 01/13/2019   Zoster, Live 10/30/2013    Conditions to be addressed/monitored: HTN, HLD, and DMII  Care Plan : Medication Management  Updates made by Darius Bump, [place] since 05/27/2021 12:00 AM     Problem: DM, HTN, HLD      Long-Range Goal: Disease Progression Prevention   Start Date: 03/01/2021  Recent  Progress: On track  Priority: High  Note:   Current Barriers:  None at present  Pharmacist Clinical Goal(s):  Over the next 30 days, patient will maintain control of chronic conditions as evidenced by medication fill history, lab values, and vital signs  through collaboration with PharmD and provider.   Interventions: 1:1 collaboration with [name], DO regarding development and update of comprehensive plan of care as evidenced by provider attestation and co-signature Inter-disciplinary care team collaboration (see longitudinal plan of care) Comprehensive medication review performed; medication list updated in electronic medical record  Diabetes:  Controlled; current treatment:metformin 1g daily, patient on lantus 12 units at bedtime; a1c 6.1  Current glucose readings: fasting glucose: 115-119  Denies hypoglycemic/hyperglycemic symptoms  Current meal patterns: to be discussed at future appts  Current exercise: still working  Recommended continue current regimen, will initiate ozempic with goal of titrating off of insulin Hypertension:  Controlled; current treatment:lisinopril 34m daily;   Current home readings: SBP 110-120s  Denies hypotensive/hypertensive symptoms  Recommended continue current regimen,  Hyperlipidemia:  Controlled; current treatment:pravastatin 291mdaily; LDL 50  Recommended continue current regimen  Patient Goals/Self-Care Activities Over the next 30 days, patient will:  take medications as prescribed  Follow Up Plan: Telephone follow up appointment with care management team member scheduled for:  3-4 weeks       Medication Assistance: None required.  Patient affirms current coverage meets needs.  Patient's [facility]  [id number] [place] [place]: [phone]: [address] [place] [place]one: [phone]: [phone] Uses pill box? No - leaves in original bottles, works well for him  Pt endorses 90% compliance  Follow Up:  Patient agrees to Care Plan and Follow-up.  Plan: Telephone follow up appointment with care management team member scheduled for:  3-4 weeks  KeeLarinda ButteryharmD Clinical Pharmacist [phone]

## 2021-05-27 MED ORDER — OZEMPIC (0.25 OR 0.5 MG/DOSE) 2 MG/1.5ML ~~LOC~~ SOPN
0.2500 mg | PEN_INJECTOR | SUBCUTANEOUS | 1 refills | Status: DC
Start: 2021-05-27 — End: 2021-06-14

## 2021-05-27 NOTE — Addendum Note (Signed)
Addended by: Caleen Jobs B on: 05/27/2021 04:30 PM   Modules accepted: Orders

## 2021-05-27 NOTE — Patient Instructions (Signed)
Visit Information  The patient verbalized understanding of instructions, educational materials, and care plan provided today and agreed to receive a mailed copy of patient instructions, educational materials, and care plan.   Telephone follow up appointment with care management team member scheduled for: 3-4 weeks  Calvin Wise

## 2021-05-30 ENCOUNTER — Other Ambulatory Visit: Payer: Self-pay | Admitting: Osteopathic Medicine

## 2021-06-14 ENCOUNTER — Other Ambulatory Visit: Payer: Self-pay

## 2021-06-14 ENCOUNTER — Ambulatory Visit: Payer: Medicare Other | Admitting: Pharmacist

## 2021-06-14 DIAGNOSIS — I10 Essential (primary) hypertension: Secondary | ICD-10-CM

## 2021-06-14 DIAGNOSIS — Z794 Long term (current) use of insulin: Secondary | ICD-10-CM

## 2021-06-14 DIAGNOSIS — Z789 Other specified health status: Secondary | ICD-10-CM

## 2021-06-14 DIAGNOSIS — E1169 Type 2 diabetes mellitus with other specified complication: Secondary | ICD-10-CM

## 2021-06-14 MED ORDER — OZEMPIC (0.25 OR 0.5 MG/DOSE) 2 MG/1.5ML ~~LOC~~ SOPN: 0.2500 mg | PEN_INJECTOR | SUBCUTANEOUS | 1 refills | Status: DC

## 2021-06-14 NOTE — Progress Notes (Addendum)
Chronic Care Management Pharmacy Note  06/14/2021 Name:  Calvin Wise MRN:  [id number] DOB:  [birth year]  Summary: addressed HTN, HLD, and primarily DM. Patient states he never received ozempic from [facility]. Resent RX that was confirmed received 05/27/21.   Recommendations/Changes made from today's visit: Begin ozempic 0.2m weekly x 4 weeks, then increase to 0.574mweekly if tolerating medicine well.   Plan: f/u with pharmacist in 1 week to ensure medication confirmed/in process to be received  Subjective: Calvin SERENAs an [age].o. year old male who is a primary patient of Calvin Wise.  The CCM team was consulted for assistance with disease management and care coordination needs.    Engaged with patient by telephone for follow up visit in response to provider referral for pharmacy case management and/or care coordination services.   Consent to Services:  The patient was given information about Chronic Care Management services, agreed to services, and gave verbal consent prior to initiation of services.  Please see initial visit note for detailed documentation.   Patient Care Team: Calvin Wise as PCP - General (Osteopathic Medicine) geoffry bond  as Consulting Physician (Glaucoma Ophthalmology) KlDarius BumpRPPione[facility] Memorial Hospitals Pharmacist (Pharmacist)  Objective:  Lab Results  Component Value Date   CREATININE 1.20 (H) 12/18/2019   CREATININE 1.0 11/13/2019   CREATININE 1.0 10/09/2018    Lab Results  Component Value Date   HGBA1C 6.1 (A) 01/13/2021   Last diabetic Eye exam:  Lab Results  Component Value Date/Time   HMDIABEYEEXA Retinopathy (A) 05/31/2017 12:00 AM        Component Value Date/Time   CHOL 110 01/13/2021 0000   TRIG 84 01/13/2021 0000   HDL 44 01/13/2021 0000   CHOLHDL 2.5 01/13/2021 0000   VLDL 16 10/30/2013 1002   LDLCALC 50 01/13/2021 0000    Hepatic Function Latest Ref Rng & Units 12/18/2019 11/13/2019  10/09/2018  Total Protein 6.1 - 8.1 g/dL 6.9 - -  Albumin 3.5 - 5.0 - 3.9 -  AST 10 - 35 U/L _0 ALT 9 - 46 U/L _1 Alk Phosphatase 25 - 125 - 58 -  Total Bilirubin 0.2 - 1.2 mg/dL 1.0 - -  Bilirubin, Direct 0.01 - 0.4 - 0.3 -    Lab Results  Component Value Date/Time   TSH 2.10 12/18/2019 03:11 PM   TSH 1.98 11/13/2019 12:00 AM   TSH 2.34 04/05/2017 08:47 AM    CBC Latest Ref Rng & Units 12/18/2019 12/04/2019 11/13/2019  WBC 3.8 - 10.8 Thousand/uL 5.2 5.4 5.2  Hemoglobin 13.2 - 17.1 g/dL 12.8(L) 12.9(A) 12.9(A)  Hematocrit 38.5 - 50.0 % 39.6 40(A) 41  Platelets 140 - 400 Thousand/uL 141 159 135(A)    Lab Results  Component Value Date/Time   VD25OH 30.82 11/13/2019 12:00 AM   VD25OH 24 (L) 04/05/2017 08:47 AM    Social History   Tobacco Use  Smoking Status Former   Packs/day: 0.25   Years: 44.00   Pack years: 11.00   Types: Cigarettes   Quit date: 05/24/2007   Years since quitting: 14.0  Smokeless Tobacco Never   BP Readings from Last 3 Encounters:  01/13/21 116/60  10/14/20 (!) 107/59  07/10/20 (!) 144/77   Pulse Readings from Last 3 Encounters:  01/13/21 (!) 57  10/14/20 92  07/10/20 (!) 57   Wt Readings from Last 3 Encounters:  01/13/21 219 lb 0.6 oz (99.4 kg)  10/14/20  213 lb (96.6 kg)  07/10/20 234 lb 0.6 oz (106.2 kg)    Assessment: Review of patient past medical history, allergies, medications, health status, including review of consultants reports, laboratory and other test data, was performed as part of comprehensive evaluation and provision of chronic care management services.   SDOH:  (Social Determinants of Health) assessments and interventions performed:    CCM Care Plan  Allergies  Allergen Reactions   Aspirin Other (See Comments), Itching and Rash    Gi bleeding   Clindamycin Itching   Clindamycin/Lincomycin     rash   Bactrim [Sulfamethoxazole-Trimethoprim] Itching and Rash    See progress note from Dr. [name]  07/19/2017. No obvious drug rash, questionable other allergy exposure    Medications Reviewed Today     Reviewed by Darius Bump, [facility] (Pharmacist) on 05/26/21 at 1328  Med List Status: <None>   Medication Order Taking? Sig Documenting Provider Last Dose Status Informant  bimatoprost (LUMIGAN) 0.01 % SOLN [id number] Yes Place 1 drop into the right eye nightly. [provider] Taking Active   Blood Glucose Monitoring Suppl (ACCU-CHEK GUIDE) w/Device KIT [id number] Yes INJECT 1 STICK INTO THE SKIN 2 (TWO) TIMES DAILY WITH BREAKFAST AND LUNCH. DX E11.9 DM [name], DO Taking Active   cetirizine (ZYRTEC) 10 MG tablet [id number] Yes Take 10 mg by mouth daily. [provider] Taking Active   Cholecalciferol (VITAMIN D3) 125 MCG (5000 UT) CAPS [id number] Yes Take by mouth. [provider] Taking Active Self  dorzolamide-timolol (COSOPT) 22.3-6.8 MG/ML ophthalmic solution [id number] Yes Place 1 drop into the right eye 2 times daily. [provider] Taking Active   glucose blood (ACCU-CHEK GUIDE) test strip [id number] Yes USE AS INSTRUCTED [name], DO Taking Active   HYDROcodone-acetaminophen (NORCO/VICODIN) 5-325 MG tablet [id number] Yes Take 1 tablet by mouth 3 (three) times daily as needed for moderate pain. [provider] Taking Active   insulin glargine (LANTUS) 100 UNIT/ML injection [id number] Yes Inject 0.12 mLs (12 Units total) into the skin at bedtime. [name], DO Taking Active   Lancets [place] [id number] Yes For use with glucometer qid as directed [name], DO Taking Active   lisinopril (PRINIVIL,ZESTRIL) 5 MG tablet [id number] Yes Take 1 tablet (5 mg total) by mouth daily. [name], DO Taking Active Self  metFORMIN (GLUCOPHAGE) 1000 MG tablet [id number] Yes TAKE 1 TABLET BY MOUTH EVERY DAY WITH BREAKFAST [name], DO Taking Active   montelukast (SINGULAIR) 10 MG tablet [id number] Yes Take 1 tablet (10  mg total) by mouth at bedtime. For sinus pain. Marcial Pacas, DO Taking Active   pravastatin (PRAVACHOL) 20 MG tablet [id number] Yes TAKE 1 TABLET BY MOUTH EVERY DAY [name], DO Taking Active   sildenafil (REVATIO) 20 MG tablet [id number] Yes Take 1-3 tablets (20-60 mg total) by mouth daily as needed (sex). Marcial Pacas, DO Taking Active             Patient Active Problem List   Diagnosis Date Noted   Type 2 diabetes mellitus with other specified complication ([place]) 01/00/7121   Diffuse large B-cell lymphoma of solid organ excluding spleen ([place]) 06/24/2020   Marginal zone lymphoma ([place]) 06/24/2020   Statin intolerance 11/01/2017   Abscess of scalp 07/05/2017   Lipoma of neck 10/12/2016   Cervical strain 10/12/2016   Ingrowing toenail 08/14/2016   History of prostate cancer 06/11/2014   Colon polyp 10/30/2013   TMJ syndrome 02/11/2013   Glaucoma 05/02/2012   Irregular heartbeat (Declines  Workup 09/2011 [facility]) 05/02/2012   Erectile dysfunction 05/02/2012   Essential hypertension 04/17/2012   Left rotator cuff tear 04/17/2012   Sciatica 04/17/2012    Immunization History  Administered Date(s) Administered   Fluad Quad(high Dose 65+) 04/03/2019, 05/13/2020   Influenza Split 04/17/2012   Influenza, High Dose Seasonal PF 04/24/2014, 04/15/2015, 02/23/2016, 03/25/2016, 04/05/2017, 05/25/2017, 05/02/2018, 05/25/2018   Influenza,inj,Quad PF,6+ Mos 04/24/2013, 03/25/2014, 04/15/2015, 03/09/2016   Influenza-Unspecified 07/12/2011, 05/26/2019, 05/25/2020   Moderna Sars-Covid-2 Vaccination 08/31/2019, 09/28/2019, 05/20/2020, 11/13/2020   Pneumococcal Conjugate-13 04/24/2013, 09/01/2015   Pneumococcal Polysaccharide-23 03/23/2012   Tdap 04/15/2015   Zoster Recombinat (Shingrix) 08/23/2018, 01/13/2019   Zoster, Live 10/30/2013    Conditions to be addressed/monitored: HTN, HLD, and DMII  There are no care plans that you recently modified to display for this  patient.     Medication Assistance: None required.  Patient affirms current coverage meets needs.  Patient's preferred [facility]  [id number] [address]: [phone]: [address] [address]PC[address]Phone: [phone]: [phone]   Uses pill box? No - leaves in original bottles, works well for him  Pt endorses 90% compliance  Follow Up:  Patient agrees to Care Plan and Follow-up.  Plan: Telephone follow up appointment with care management team member scheduled for:  1 weeks  KLarinda Buttery PharmD Clinical Pharmacist CSt. [facility] Center[facility] At [phone]

## 2021-06-14 NOTE — Patient Instructions (Signed)
Visit Information  Thank you for taking time to visit with me today. Please don't hesitate to contact me if I can be of assistance to you before our next scheduled telephone appointment.  Following are the goals we discussed today:  Patient Goals/Self-Care Activities Over the next 30 days, patient will:  take medications as prescribed  Follow Up Plan: Telephone follow up appointment with care management team member scheduled for: 1 week   Please call the care guide team at [phone] if you need to cancel or reschedule your appointment.   The patient verbalized understanding of instructions, educational materials, and care plan provided today and agreed to receive a mailed copy of patient instructions, educational materials, and care plan.   Calvin Wise

## 2021-06-22 ENCOUNTER — Other Ambulatory Visit: Payer: Self-pay

## 2021-06-22 ENCOUNTER — Ambulatory Visit: Payer: Medicare Other | Admitting: Pharmacist

## 2021-06-22 DIAGNOSIS — I10 Essential (primary) hypertension: Secondary | ICD-10-CM

## 2021-06-22 DIAGNOSIS — Z789 Other specified health status: Secondary | ICD-10-CM

## 2021-06-22 DIAGNOSIS — E1169 Type 2 diabetes mellitus with other specified complication: Secondary | ICD-10-CM

## 2021-06-22 DIAGNOSIS — Z794 Long term (current) use of insulin: Secondary | ICD-10-CM

## 2021-06-22 NOTE — Progress Notes (Signed)
Chronic Care Management Pharmacy Note  06/23/2021 Name:  Calvin Wise MRN:  [id number] DOB:  24-Jul-1946  Summary: addressed HTN, HLD, and primarily DM. Patient states he never received ozempic from [facility], resent RX and confirmed electronic received 05/27/21 as well as 06/14/21. Patient states [facility] staff is requesting hard copy RX since it is not found in their system electronically.   Recommendations/Changes made from today's visit:   Patient missed transfer of care appointment, recommended patient first call for appointment to establish care. After that, may begin ozempic 0.61m weekly x 4 weeks, then increase to 0.534mweekly if tolerating medicine well. Will need hard copy RX.  Plan: f/u with pharmacist in 2 months after established care with new PCP  Subjective: Calvin WALLMANs an [age].o. year old male who is a primary patient of AlEmeterio ReeveDO.  The CCM team was consulted for assistance with disease management and care coordination needs.    Engaged with patient by telephone for follow up visit in response to provider referral for pharmacy case management and/or care coordination services.   Consent to Services:  The patient was given information about Chronic Care Management services, agreed to services, and gave verbal consent prior to initiation of services.  Please see initial visit note for detailed documentation.   Patient Care Team: AlEmeterio ReeveDO as PCP - General (Osteopathic Medicine) geoffry bond  as Consulting Physician (Glaucoma Ophthalmology) KlDarius BumpRPThe [facility]oscopy Center At St [facility] LLCs Pharmacist (Pharmacist)  Objective:  Lab Results  Component Value Date   CREATININE 1.20 (H) 12/18/2019   CREATININE 1.0 11/13/2019   CREATININE 1.0 10/09/2018    Lab Results  Component Value Date   HGBA1C 6.1 (A) 01/13/2021   Last diabetic Eye exam:  Lab Results  Component Value Date/Time   HMDIABEYEEXA Retinopathy (A)  05/31/2017 12:00 AM        Component Value Date/Time   CHOL 110 01/13/2021 0000   TRIG 84 01/13/2021 0000   HDL 44 01/13/2021 0000   CHOLHDL 2.5 01/13/2021 0000   VLDL 16 10/30/2013 1002   LDLCALC 50 01/13/2021 0000    Hepatic Function Latest Ref Rng & Units 12/18/2019 11/13/2019 10/09/2018  Total Protein 6.1 - 8.1 g/dL 6.9 - -  Albumin 3.5 - 5.0 - 3.9 -  AST 10 - 35 U/L 19 17 28   ALT 9 - 46 U/L 13 20 30   Alk Phosphatase 25 - 125 - 58 -  Total Bilirubin 0.2 - 1.2 mg/dL 1.0 - -  Bilirubin, Direct 0.01 - 0.4 - 0.3 -    Lab Results  Component Value Date/Time   TSH 2.10 12/18/2019 03:11 PM   TSH 1.98 11/13/2019 12:00 AM   TSH 2.34 04/05/2017 08:47 AM    CBC Latest Ref Rng & Units 12/18/2019 12/04/2019 11/13/2019  WBC 3.8 - 10.8 Thousand/uL 5.2 5.4 5.2  Hemoglobin 13.2 - 17.1 g/dL 12.8(L) 12.9(A) 12.9(A)  Hematocrit 38.5 - 50.0 % 39.6 40(A) 41  Platelets 140 - 400 Thousand/uL 141 159 135(A)    Lab Results  Component Value Date/Time   VD25OH 30.82 11/13/2019 12:00 AM   VD25OH 24 (L) 04/05/2017 08:47 AM    Social History   Tobacco Use  Smoking Status Former   Packs/day: 0.25   Years: 44.00   Pack years: 11.00   Types: Cigarettes   Quit date: 05/24/2007   Years since quitting: 14.0  Smokeless Tobacco Never   BP Readings from Last 3 Encounters:  01/13/21  116/60  10/14/20 (!) 107/59  07/10/20 (!) 144/77   Pulse Readings from Last 3 Encounters:  01/13/21 (!) 57  10/14/20 92  07/10/20 (!) 57   Wt Readings from Last 3 Encounters:  01/13/21 219 lb 0.6 oz (99.4 kg)  10/14/20 213 lb (96.6 kg)  07/10/20 234 lb 0.6 oz (106.2 kg)    Assessment: Review of patient past medical history, allergies, medications, health status, including review of consultants reports, laboratory and other test data, was performed as part of comprehensive evaluation and provision of chronic care management services.   SDOH:  (Social Determinants of Health) assessments and interventions  performed:    CCM Care Plan  Allergies  Allergen Reactions   Aspirin Other (See Comments), Itching and Rash    Gi bleeding   Clindamycin Itching   Clindamycin/Lincomycin     rash   Bactrim [Sulfamethoxazole-Trimethoprim] Itching and Rash    See progress note from Dr. [name] 07/19/2017. No obvious drug rash, questionable other allergy exposure    Medications Reviewed Today     Reviewed by Darius Bump, [facility] (Pharmacist) on 06/23/21 at [phone]  Med List Status: <None>   Medication Order Taking? Sig Documenting Provider Last Dose Status Informant  bimatoprost (LUMIGAN) 0.01 % SOLN [id number] Yes Place 1 drop into the right eye nightly. [provider] Taking Active   Blood Glucose Monitoring Suppl (ACCU-CHEK GUIDE) w/Device KIT [id number] Yes INJECT 1 STICK INTO THE SKIN 2 (TWO) TIMES DAILY WITH BREAKFAST AND LUNCH. DX E11.9 DM [name], DO Taking Active   cetirizine (ZYRTEC) 10 MG tablet [id number] Yes Take 10 mg by mouth daily. [provider] Taking Active   Cholecalciferol (VITAMIN D3) 125 MCG (5000 UT) CAPS [id number] Yes Take by mouth. [provider] Taking Active Self  dorzolamide-timolol (COSOPT) 22.3-6.8 MG/ML ophthalmic solution [id number] Yes Place 1 drop into the right eye 2 times daily. [provider] Taking Active   glucose blood (ACCU-CHEK GUIDE) test strip [id number] Yes USE AS INSTRUCTED [name], DO Taking Active   HYDROcodone-acetaminophen (NORCO/VICODIN) 5-325 MG tablet [id number] Yes Take 1 tablet by mouth 3 (three) times daily as needed for moderate pain. [provider] Taking Active   insulin glargine (LANTUS) 100 UNIT/ML injection [id number] Yes Inject 0.12 mLs (12 Units total) into the skin at bedtime. [name], DO Taking Active   Lancets [place] [id number] Yes For use with glucometer qid as directed [name], DO Taking Active   lisinopril (PRINIVIL,ZESTRIL) 5 MG tablet [id number] Yes Take  1 tablet (5 mg total) by mouth daily. [name], DO Taking Active Self  metFORMIN (GLUCOPHAGE) 1000 MG tablet [id number] Yes TAKE 1 TABLET BY MOUTH EVERY DAY WITH BREAKFAST [name], DO Taking Active   montelukast (SINGULAIR) 10 MG tablet [id number] Yes Take 1 tablet (10 mg total) by mouth at bedtime. For sinus pain. Marcial Pacas, DO Taking Active   pravastatin (PRAVACHOL) 20 MG tablet [id number] Yes Take 1 tablet (20 mg total) by mouth daily. NO REFILLS. NEEDS TO TRANSITION CARE TO NEW PCP. Silverio Decamp, MD Taking Active   Semaglutide,0.25 or 0.5MG/DOS, (OZEMPIC, 0.25 OR 0.5 MG/DOSE,) 2 MG/1.5ML SOPN [id number] No Inject 0.25 mg into the skin once a week. Inject 0.25 mg subq qwk for 4 wk then inject 0.5 mg subq qwk x4 wk, then go to full dose pen qwk  Patient not taking: Reported on 06/23/2021   Luetta Nutting, DO Not Taking Active   sildenafil (REVATIO) 20 MG tablet [id number] Yes Take 1-3 tablets (  20-60 mg total) by mouth daily as needed (sex). Marcial Pacas, DO Taking Active             Patient Active Problem List   Diagnosis Date Noted   Type 2 diabetes mellitus with other specified complication ([place]) 74/25/9563   Diffuse large B-cell lymphoma of solid organ excluding spleen ([place]) 06/24/2020   Marginal zone lymphoma (HCC) 06/24/2020   Statin intolerance 11/01/2017   Abscess of scalp 07/05/2017   Lipoma of neck 10/12/2016   Cervical strain 10/12/2016   Ingrowing toenail 08/14/2016   History of prostate cancer 06/11/2014   Colon polyp 10/30/2013   TMJ syndrome 02/11/2013   Glaucoma 05/02/2012   Irregular heartbeat (Declines Workup 09/2011 [facility]) 05/02/2012   Erectile dysfunction 05/02/2012   Essential hypertension 04/17/2012   Left rotator cuff tear 04/17/2012   Sciatica 04/17/2012    Immunization History  Administered Date(s) Administered   Fluad Quad(high Dose 65+) 04/03/2019, 05/13/2020   Influenza Split 04/17/2012   Influenza, High Dose  Seasonal PF 04/24/2014, 04/15/2015, 02/23/2016, 03/25/2016, 04/05/2017, 05/25/2017, 05/02/2018, 05/25/2018   Influenza,inj,Quad PF,6+ Mos 04/24/2013, 03/25/2014, 04/15/2015, 03/09/2016   Influenza-Unspecified 07/12/2011, 05/26/2019, 05/25/2020   Moderna Sars-Covid-2 Vaccination 08/31/2019, 09/28/2019, 05/20/2020, 11/13/2020   Pneumococcal Conjugate-13 04/24/2013, 09/01/2015   Pneumococcal Polysaccharide-23 03/23/2012   Tdap 04/15/2015   Zoster Recombinat (Shingrix) 08/23/2018, 01/13/2019   Zoster, Live 10/30/2013    Conditions to be addressed/monitored: HTN, HLD, and DMII  Care Plan : Medication Management  Updates made by Darius Bump, [place] since 06/23/2021 12:00 AM     Problem: DM, HTN, HLD      Long-Range Goal: Disease Progression Prevention   Start Date: 03/01/2021  Recent Progress: On track  Priority: High  Note:   Current Barriers:  None at present  Pharmacist Clinical Goal(s):  Over the next 30 days, patient will maintain control of chronic conditions as evidenced by medication fill history, lab values, and vital signs  through collaboration with PharmD and provider.   Interventions: 1:1 collaboration with [name], DO regarding development and update of comprehensive plan of care as evidenced by provider attestation and co-signature Inter-disciplinary care team collaboration (see longitudinal plan of care) Comprehensive medication review performed; medication list updated in electronic medical record  Diabetes:  Controlled; current treatment:metformin 1g daily, patient on lantus 12 units at bedtime; a1c 6.1  Current glucose readings: fasting glucose: 115-119  Denies hypoglycemic/hyperglycemic symptoms  Current meal patterns: to be discussed at future appts  Current exercise: still working  Recommended continue current regimen, will initiate ozempic with goal of titrating off of insulin after patient establishes with PCP.  Hypertension:  Controlled;  current treatment:lisinopril 24m daily;   Current home readings: SBP 110-120s  Denies hypotensive/hypertensive symptoms  Recommended continue current regimen,  Hyperlipidemia:  Controlled; current treatment:pravastatin 250mdaily; LDL 50  Recommended continue current regimen  Patient Goals/Self-Care Activities Over the next 30 days, patient will:  take medications as prescribed  Follow Up Plan: Telephone follow up appointment with care management team member scheduled for:  2 months        Medication Assistance: None required.  Patient affirms current coverage meets needs.  [facility]  [id number] [place] [place]: [phone]: [address] [place] [place]: [phone]: [phone] Uses pill box? No -  leaves in original bottles, works well for him  Pt endorses 90% compliance  Follow Up:  Patient agrees to Care Plan and Follow-up.  Plan: Telephone follow up appointment with care management team member scheduled for:  2 months  Larinda Buttery, PharmD Clinical Pharmacist [facility] [phone]

## 2021-06-23 DIAGNOSIS — E1169 Type 2 diabetes mellitus with other specified complication: Secondary | ICD-10-CM

## 2021-06-23 DIAGNOSIS — I10 Essential (primary) hypertension: Secondary | ICD-10-CM

## 2021-06-23 DIAGNOSIS — Z794 Long term (current) use of insulin: Secondary | ICD-10-CM

## 2021-06-23 NOTE — Patient Instructions (Signed)
Visit Information  Thank you for taking time to visit with me today. Please don't hesitate to contact me if I can be of assistance to you before our next scheduled telephone appointment.  Following are the goals we discussed today:   Patient Goals/Self-Care Activities Over the next 60 days, patient will:  take medications as prescribed & establish care with new PCP   Follow Up Plan: Telephone follow up appointment with care management team member scheduled for: 2 months  Please call the care guide team at [phone] if you need to cancel or reschedule your appointment.    The patient verbalized understanding of instructions, educational materials, and care plan provided today and agreed to receive a mailed copy of patient instructions, educational materials, and care plan.   Darius Bump
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
## 2021-07-28 ENCOUNTER — Ambulatory Visit (INDEPENDENT_AMBULATORY_CARE_PROVIDER_SITE_OTHER): Payer: Medicare Other | Admitting: Medical-Surgical

## 2021-07-28 ENCOUNTER — Encounter: Payer: Self-pay | Admitting: Medical-Surgical

## 2021-07-28 ENCOUNTER — Other Ambulatory Visit: Payer: Self-pay

## 2021-07-28 VITALS — BP 139/67 | HR 69 | Resp 20 | Ht 75.0 in | Wt 232.0 lb

## 2021-07-28 DIAGNOSIS — E1169 Type 2 diabetes mellitus with other specified complication: Secondary | ICD-10-CM | POA: Diagnosis not present

## 2021-07-28 DIAGNOSIS — N521 Erectile dysfunction due to diseases classified elsewhere: Secondary | ICD-10-CM

## 2021-07-28 DIAGNOSIS — Z794 Long term (current) use of insulin: Secondary | ICD-10-CM

## 2021-07-28 DIAGNOSIS — Z7689 Persons encountering health services in other specified circumstances: Secondary | ICD-10-CM | POA: Diagnosis not present

## 2021-07-28 DIAGNOSIS — I10 Essential (primary) hypertension: Secondary | ICD-10-CM | POA: Diagnosis not present

## 2021-07-28 DIAGNOSIS — F4310 Post-traumatic stress disorder, unspecified: Secondary | ICD-10-CM

## 2021-07-28 LAB — POCT GLYCOSYLATED HEMOGLOBIN (HGB A1C): HbA1c, POC (controlled diabetic range): 6.7 % (ref 0.0–7.0)

## 2021-07-28 MED ORDER — METFORMIN HCL 1000 MG PO TABS: ORAL_TABLET | ORAL | 1 refills | Status: DC

## 2021-07-28 MED ORDER — PRAVASTATIN SODIUM 20 MG PO TABS: 20.0000 mg | ORAL_TABLET | Freq: Every day | ORAL | 3 refills | Status: DC

## 2021-07-28 MED ORDER — OZEMPIC (0.25 OR 0.5 MG/DOSE) 2 MG/1.5ML ~~LOC~~ SOPN: 0.2500 mg | PEN_INJECTOR | SUBCUTANEOUS | 1 refills | Status: DC

## 2021-07-30 ENCOUNTER — Other Ambulatory Visit: Payer: Self-pay

## 2021-07-30 ENCOUNTER — Telehealth (INDEPENDENT_AMBULATORY_CARE_PROVIDER_SITE_OTHER): Payer: Medicare Other | Admitting: Family Medicine

## 2021-07-30 ENCOUNTER — Encounter: Payer: Self-pay | Admitting: Family Medicine

## 2021-07-30 ENCOUNTER — Other Ambulatory Visit: Payer: Self-pay | Admitting: Osteopathic Medicine

## 2021-07-30 DIAGNOSIS — U071 COVID-19: Secondary | ICD-10-CM | POA: Diagnosis not present

## 2021-07-30 MED ORDER — NIRMATRELVIR/RITONAVIR (PAXLOVID)TABLET: 3.0000 | ORAL_TABLET | Freq: Two times a day (BID) | ORAL | 0 refills | Status: AC

## 2021-07-30 NOTE — Progress Notes (Signed)
° ° °  Virtual Visit via Video Note  I connected with Reinaldo Berber on 07/30/21 at  2:40 PM EST by a video enabled telemedicine application and verified that I am speaking with the correct person using two identifiers.   I discussed the limitations of evaluation and management by telemedicine and the availability of in person appointments. The patient expressed understanding and agreed to proceed.  Patient location: at home Provider location: in office  Subjective:    CC:   Chief Complaint  Patient presents with   Covid Positive    HPI: Started feeling bad on Wednesday afternoon. Pt tested positive yesterday. His sxs have been sinus pressure,congestion,and headache. This is the first time he has had COVID.  No fever.  No GI sxs.  Mild ST. No ear pain.  He has been taking Vick's , nyquil and a nasal spray. His mucus has been yellow. Denies any f/s/c or body aches.  Risk Factors, Age, DM, and recent hx of B cell Lyphoma.     Past medical history, Surgical history, Family history not pertinant except as noted below, Social history, Allergies, and medications have been entered into the medical record, reviewed, and corrections made.    Objective:    General: Speaking clearly in complete sentences without any shortness of breath.  Alert and oriented x3.  Normal judgment. No apparent acute distress.   Impression and Recommendations:    Problem List Items Addressed This Visit   None Visit Diagnoses     COVID-19    -  Primary   Relevant Medications   nirmatrelvir/ritonavir EUA (PAXLOVID) 20 x 150 MG & 10 x 100MG  TABS       COVID-19-discussed options.  He is within the window to take Paxil bid and has a normal GFR.  He is medium risk based on our risk score but he was also diagnosed with B-cell lymphoma a year ago that he is technically in remission.  We discussed pros and cons of medication and potential side effects.  He would like to move forward with taking the Paxil bid.   Okay to continue with symptomatic care as well.  Call back if not better in 1 week.  No orders of the defined types were placed in this encounter.   Meds ordered this encounter  Medications   nirmatrelvir/ritonavir EUA (PAXLOVID) 20 x 150 MG & 10 x 100MG  TABS    Sig: Take 3 tablets by mouth 2 (two) times daily for 5 days. (Take nirmatrelvir 150 mg two tablets twice daily for 5 days and ritonavir 100 mg one tablet twice daily for 5 days) Patient GFR is 83    Dispense:  30 tablet    Refill:  0     I discussed the assessment and treatment plan with the patient. The patient was provided an opportunity to ask questions and all were answered. The patient agreed with the plan and demonstrated an understanding of the instructions.   The patient was advised to call back or seek an in-person evaluation if the symptoms worsen or if the condition fails to improve as anticipated.   Beatrice Lecher, MD

## 2021-07-30 NOTE — Progress Notes (Signed)
Pt tested positive yesterday. His sxs have been sinus pressure,congestion,and headache.   He has been taking Vick's , nyquil and a nasal spray. His mucus has been yellow. Denies any f/s/c or body aches.

## 2021-08-06 ENCOUNTER — Other Ambulatory Visit: Payer: Self-pay

## 2021-08-06 ENCOUNTER — Ambulatory Visit (INDEPENDENT_AMBULATORY_CARE_PROVIDER_SITE_OTHER): Payer: Medicare Other | Admitting: Pharmacist

## 2021-08-06 DIAGNOSIS — I10 Essential (primary) hypertension: Secondary | ICD-10-CM

## 2021-08-06 DIAGNOSIS — E1169 Type 2 diabetes mellitus with other specified complication: Secondary | ICD-10-CM

## 2021-08-06 DIAGNOSIS — Z789 Other specified health status: Secondary | ICD-10-CM

## 2021-08-06 NOTE — Progress Notes (Signed)
Chronic Care Management Pharmacy Note  08/06/2021 Name:  Calvin Wise MRN:  [id number] DOB:  Dec 11, 1945  Summary: addressed HTN, HLD, and primarily DM. Patient received hard copy RX for ozempic at transfer of care visit with new PCP, however he had a family emergency and has not yet physically been able to take it to his [place].   BG runs 120-130s, doing well.    Recommendations/Changes made from today's visit:   Provided medication counseling on new start ozempic. Advised patient to fill RX when he is able to return (estimating next week or so). Advised to begin ozempic 0.$RemoveBeforeD'25mg'LIYlfaTaeuJgLN$  weekly x 4 weeks, then increase to 0.$RemoveBefor'5mg'BZnntYIEFOsP$  weekly if tolerating medicine well.   Plan: f/u with pharmacist in 1 month to assess medication changes.  Subjective: Calvin Wise is an 76 y.o. year old male who is a primary patient of Samuel Bouche, NP.  The CCM team was consulted for assistance with disease management and care coordination needs.    Engaged with patient by telephone for follow up visit in response to provider referral for pharmacy case management and/or care coordination services.   Consent to Services:  The patient was given information about Chronic Care Management services, agreed to services, and gave verbal consent prior to initiation of services.  Please see initial visit note for detailed documentation.   Patient Care Team: Samuel Bouche, NP as PCP - General (Nurse Practitioner) geoffry bond  as Consulting Physician (Glaucoma Ophthalmology) Darius Bump, [facility] as Pharmacist (Pharmacist)  Objective:  Lab Results  Component Value Date   CREATININE 1.20 (H) 12/18/2019   CREATININE 1.0 11/13/2019   CREATININE 1.0 10/09/2018    Lab Results  Component Value Date   HGBA1C 6.7 07/28/2021   Last diabetic Eye exam:  Lab Results  Component Value Date/Time   HMDIABEYEEXA Retinopathy (A) 01/11/2021 12:00 AM        Component Value Date/Time   CHOL 110 01/13/2021 0000   TRIG  84 01/13/2021 0000   HDL 44 01/13/2021 0000   CHOLHDL 2.5 01/13/2021 0000   VLDL 16 10/30/2013 1002   LDLCALC 50 01/13/2021 0000    Hepatic Function Latest Ref Rng & Units 12/18/2019 11/13/2019 10/09/2018  Total Protein 6.1 - 8.1 g/dL 6.9 - -  Albumin 3.5 - 5.0 - 3.9 -  AST 10 - 35 U/L $Remo'19 17 28  'vEJwg$ ALT 9 - 46 U/L $Remo'13 20 30  'QQvlq$ Alk Phosphatase 25 - 125 - 58 -  Total Bilirubin 0.2 - 1.2 mg/dL 1.0 - -  Bilirubin, Direct 0.01 - 0.4 - 0.3 -    Lab Results  Component Value Date/Time   TSH 2.10 12/18/2019 03:11 PM   TSH 1.98 11/13/2019 12:00 AM   TSH 2.34 04/05/2017 08:47 AM    CBC Latest Ref Rng & Units 12/18/2019 12/04/2019 11/13/2019  WBC 3.8 - 10.8 Thousand/uL 5.2 5.4 5.2  Hemoglobin 13.2 - 17.1 g/dL 12.8(L) 12.9(A) 12.9(A)  Hematocrit 38.5 - 50.0 % 39.6 40(A) 41  Platelets 140 - 400 Thousand/uL 141 159 135(A)    Lab Results  Component Value Date/Time   VD25OH 30.82 11/13/2019 12:00 AM   VD25OH 24 (L) 04/05/2017 08:47 AM    Social History   Tobacco Use  Smoking Status Former   Packs/day: 0.25   Years: 44.00   Pack years: 11.00   Types: Cigarettes   Quit date: 05/24/2007   Years since quitting: 14.2  Smokeless Tobacco Never   BP Readings from Last 3 Encounters:  Encounters:  07/28/21 139/67  01/13/21 116/60  10/14/20 (!) 107/59   Pulse Readings from Last 3 Encounters:  07/28/21 69  01/13/21 (!) 57  10/14/20 92   Wt Readings from Last 3 Encounters:  07/28/21 232 lb (105.2 kg)  01/13/21 219 lb 0.6 oz (99.4 kg)  10/14/20 213 lb (96.6 kg)    Assessment: Review of patient past medical history, allergies, medications, health status, including review of consultants reports, laboratory and other test data, was performed as part of comprehensive evaluation and provision of chronic care management services.   SDOH:  (Social Determinants of Health) assessments and interventions performed:    CCM Care Plan  Allergies  Allergen Reactions   Aspirin Other (See Comments), Itching and Rash     Gi bleeding   Clindamycin Itching   Clindamycin/Lincomycin     rash   Bactrim [Sulfamethoxazole-Trimethoprim] Itching and Rash    See progress note from Dr. [name] 07/19/2017. No obvious drug rash, questionable other allergy exposure    Medications Reviewed Today     Reviewed by Darius Bump, [facility] (Pharmacist) on 08/06/21 at 1128  Med List Status: <None>   Medication Order Taking? Sig Documenting Provider Last Dose Status Informant  bimatoprost (LUMIGAN) 0.01 % SOLN [id number] Yes Place 1 drop into the right eye nightly. [provider] Taking Active   Blood Glucose Monitoring Suppl (ACCU-CHEK GUIDE) w/Device KIT [id number] Yes INJECT 1 STICK INTO THE SKIN 2 (TWO) TIMES DAILY WITH BREAKFAST AND LUNCH. DX E11.9 DM [name], DO Taking Active   cetirizine (ZYRTEC) 10 MG tablet [id number] Yes Take 10 mg by mouth daily. [provider] Taking Active   Cholecalciferol (VITAMIN D3) 125 MCG (5000 UT) CAPS [id number] Yes Take by mouth. [provider] Taking Active Self  dorzolamide-timolol (COSOPT) 22.3-6.8 MG/ML ophthalmic solution [id number] Yes Place 1 drop into the right eye 2 times daily. [provider] Taking Active   glucose blood (ACCU-CHEK GUIDE) test strip [id number] Yes USE AS INSTRUCTED [name], DO Taking Active   HYDROcodone-acetaminophen (NORCO/VICODIN) 5-325 MG tablet [id number] Yes Take 1 tablet by mouth 3 (three) times daily as needed for moderate pain. [provider] Taking Active   insulin glargine (LANTUS) 100 UNIT/ML injection [id number] Yes Inject 0.12 mLs (12 Units total) into the skin at bedtime. [name], DO Taking Active   ipratropium (ATROVENT) 0.06 % nasal spray [id number] Yes PLACE 2 SPRAYS INTO BOTH NOSTRILS 4 (FOUR) TIMES DAILY. AS NEEDED FOR RUNNY NOSE / POSTNASAL DRIP Samuel Bouche, NP Taking Active   Lancets MISC [id number] Yes For use with glucometer qid as directed [name], DO Taking  Active   lisinopril (PRINIVIL,ZESTRIL) 5 MG tablet [id number] Yes Take 1 tablet (5 mg total) by mouth daily. [name], DO Taking Active Self  metFORMIN (GLUCOPHAGE) 1000 MG tablet [id number] Yes TAKE 1 TABLET BY MOUTH EVERY DAY WITH BREAKFAST Samuel Bouche, NP Taking Active   montelukast (SINGULAIR) 10 MG tablet [id number] Yes Take 1 tablet (10 mg total) by mouth at bedtime. For sinus pain. Marcial Pacas, DO Taking Active   pravastatin (PRAVACHOL) 20 MG tablet [id number] Yes Take 1 tablet (20 mg total) by mouth daily. Samuel Bouche, NP Taking Active   Semaglutide,0.25 or 0.5MG/DOS, (OZEMPIC, 0.25 OR 0.5 MG/DOSE,) 2 MG/1.5ML SOPN [id number]  Inject 0.25 mg into the skin once a week. Inject 0.25 mg subq qwk for 4 wk then inject 0.5 mg subq qwk x4 wk, then go to full dose pen qwk  Patient not taking: Reported  Samuel Bouche, NP  Active   sildenafil (REVATIO) 20 MG tablet [id number] Yes Take 1-3 tablets (20-60 mg total) by mouth daily as needed (sex). Marcial Pacas, DO Taking Active             Patient Active Problem List   Diagnosis Date Noted   Type 2 diabetes mellitus with other specified complication ([place]) 50/03/3817   Diffuse large B-cell lymphoma of solid organ excluding spleen ([place]) 06/24/2020   Marginal zone lymphoma (HCC) 06/24/2020   Statin intolerance 11/01/2017   Lipoma of neck 10/12/2016   Cervical strain 10/12/2016   Ingrowing toenail 08/14/2016   History of prostate cancer 06/11/2014   Colon polyp 10/30/2013   TMJ syndrome 02/11/2013   Glaucoma 05/02/2012   Irregular heartbeat (Declines Workup 09/2011 [facility]) 05/02/2012   Erectile dysfunction 05/02/2012   Essential hypertension 04/17/2012   Left rotator cuff tear 04/17/2012   Sciatica 04/17/2012    Immunization History  Administered Date(s) Administered   Fluad Quad(high Dose 65+) 04/03/2019, 05/13/2020, 04/21/2021   Influenza Split 04/17/2012   Influenza, High Dose Seasonal PF 04/24/2014, 04/15/2015,  02/23/2016, 03/25/2016, 04/05/2017, 05/25/2017, 05/02/2018, 05/25/2018   Influenza,inj,Quad PF,6+ Mos 04/24/2013, 03/25/2014, 04/15/2015, 03/09/2016   Influenza-Unspecified 07/12/2011, 05/26/2019, 05/25/2020   Moderna Covid-19 Vaccine Bivalent Booster 70yrs & up 06/28/2021   Moderna Sars-Covid-2 Vaccination 08/31/2019, 09/28/2019, 05/20/2020, 11/13/2020   Pneumococcal Conjugate-13 04/24/2013, 09/01/2015   Pneumococcal Polysaccharide-23 03/23/2012   Tdap 04/15/2015   Zoster Recombinat (Shingrix) 08/23/2018, 01/13/2019   Zoster, Live 10/30/2013    Conditions to be addressed/monitored: HTN, HLD, and DMII  Care Plan : Medication Management  Updates made by Darius Bump, [place] since 08/06/2021 12:00 AM     Problem: DM, HTN, HLD      Long-Range Goal: Disease Progression Prevention   Start Date: 03/01/2021  Recent Progress: On track  Priority: High  Note:   Current Barriers:  None at present  Pharmacist Clinical Goal(s):  Over the next 30 days, patient will maintain control of chronic conditions as evidenced by medication fill history, lab values, and vital signs  through collaboration with PharmD and provider.   Interventions: 1:1 collaboration with Samuel Bouche, NP regarding development and update of comprehensive plan of care as evidenced by provider attestation and co-signature Inter-disciplinary care team collaboration (see longitudinal plan of care) Comprehensive medication review performed; medication list updated in electronic medical record  Diabetes:  Controlled; current treatment:metformin 1g daily, patient on lantus 12 units at bedtime; a1c 6.1  Current glucose readings: fasting glucose: 115-119  Denies hypoglycemic/hyperglycemic symptoms  Current meal patterns: to be discussed at future appts  Current exercise: still working  Recommended continue current regimen, will initiate ozempic with goal of titrating off of insulin. Hypertension:  Controlled; current  treatment:lisinopril 5mg  daily;   Current home readings: SBP 110-120s  Denies hypotensive/hypertensive symptoms  Recommended continue current regimen,  Hyperlipidemia:  Controlled; current treatment:pravastatin 20mg  daily; LDL 50  Recommended continue current regimen  Patient Goals/Self-Care Activities Over the next 30 days, patient will:  take medications as prescribed  Follow Up Plan: Telephone follow up appointment with care management team member scheduled for:  1 month         Medication Assistance: None required.  Patient affirms current coverage meets needs.  Patient's preferred pharmacy is:  [id number] [place], [place] - [address] [place] [address] Phone: [phone] Fax: [place], [place] - [address]  [place] [address] Phone: [phone] Fax: [phone]     Uses pill box? No - leaves in original bottles, works well for him  Pt endorses 90% compliance  Follow Up:  Patient agrees to Care Plan and Follow-up.  Plan: Telephone follow up appointment with care management team member scheduled for:  1 month  Larinda Buttery, PharmD Clinical Pharmacist [facility] [phone]

## 2021-08-06 NOTE — Patient Instructions (Signed)
Visit Information  Thank you for taking time to visit with me today. Please don't hesitate to contact me if I can be of assistance to you before our next scheduled telephone appointment.  Following are the goals we discussed today:   Patient Goals/Self-Care Activities Over the next 30 days, patient will:  take medications as prescribed  Follow Up Plan: Telephone follow up appointment with care management team member scheduled for:  1 month  Please call the care guide team at [phone] if you need to cancel or reschedule your appointment.   If you are experiencing a Mental Health or [place] or need someone to talk to, please call the Suicide and Crisis Lifeline: 988 call 1-800-273-TALK (toll free, 24 hour hotline)   The patient verbalized understanding of instructions, educational materials, and care plan provided today and agreed to receive a mailed copy of patient instructions, educational materials, and care plan.   Calvin Wise

## 2021-08-24 DIAGNOSIS — E1169 Type 2 diabetes mellitus with other specified complication: Secondary | ICD-10-CM

## 2021-08-24 DIAGNOSIS — Z794 Long term (current) use of insulin: Secondary | ICD-10-CM

## 2021-08-24 DIAGNOSIS — I10 Essential (primary) hypertension: Secondary | ICD-10-CM

## 2021-09-03 DIAGNOSIS — M75102 Unspecified rotator cuff tear or rupture of left shoulder, not specified as traumatic: Secondary | ICD-10-CM | POA: Insufficient documentation

## 2021-09-07 ENCOUNTER — Telehealth: Payer: Medicare Other

## 2021-09-07 NOTE — Progress Notes (Unsigned)
Chronic Care Management Pharmacy Note  09/07/2021 Name:  Calvin Wise MRN:  [id number] DOB:  June 22, 1946  Summary: addressed HTN, HLD, and primarily DM. Patient received hard copy RX for ozempic at transfer of care visit with new PCP, however he had a family emergency and has not yet physically been able to take it to his [place].   BG runs 120-130s, doing well.    Recommendations/Changes made from today's visit:   Provided medication counseling on new start ozempic. Advised patient to fill RX when he is able to return (estimating next week or so). Advised to begin ozempic 0.36m weekly x 4 weeks, then increase to 0.541mweekly if tolerating medicine well.   Plan: f/u with pharmacist in 1 month to assess medication changes.  Subjective: Calvin BERKHEIMERs an [age].o. year old male who is a primary patient of Calvin Wise.  The CCM team was consulted for assistance with disease management and care coordination needs.    Engaged with patient by telephone for follow up visit in response to provider referral for pharmacy case management and/or care coordination services.   Consent to Services:  The patient was given information about Chronic Care Management services, agreed to services, and gave verbal consent prior to initiation of services.  Please see initial visit note for detailed documentation.   Patient Care Team: Calvin Wise as PCP - General (Nurse Practitioner) geoffry bond  as Consulting Physician (Glaucoma Ophthalmology) KlDarius [facility]umpRP[facility] Pharmacist (Pharmacist)  Objective:  Lab Results  Component Value Date   CREATININE 1.20 (H) 12/18/2019   CREATININE 1.0 11/13/2019   CREATININE 1.0 10/09/2018    Lab Results  Component Value Date   HGBA1C 6.7 07/28/2021   Last diabetic Eye exam:  Lab Results  Component Value Date/Time   HMDIABEYEEXA Retinopathy (A) 01/11/2021 12:00 AM        Component Value Date/Time   CHOL 110 01/13/2021 0000   TRIG  84 01/13/2021 0000   HDL 44 01/13/2021 0000   CHOLHDL 2.5 01/13/2021 0000   VLDL 16 10/30/2013 1002   LDLCALC 50 01/13/2021 0000    Hepatic Function Latest Ref Rng & Units 12/18/2019 11/13/2019 10/09/2018  Total Protein 6.1 - 8.1 g/dL 6.9 - -  Albumin 3.5 - 5.0 - 3.9 -  AST 10 - 35 U/L _0 ALT 9 - 46 U/L _1 Alk Phosphatase 25 - 125 - 58 -  Total Bilirubin 0.2 - 1.2 mg/dL 1.0 - -  Bilirubin, Direct 0.01 - 0.4 - 0.3 -    Lab Results  Component Value Date/Time   TSH 2.10 12/18/2019 03:11 PM   TSH 1.98 11/13/2019 12:00 AM   TSH 2.34 04/05/2017 08:47 AM    CBC Latest Ref Rng & Units 12/18/2019 12/04/2019 11/13/2019  WBC 3.8 - 10.8 Thousand/uL 5.2 5.4 5.2  Hemoglobin 13.2 - 17.1 g/dL 12.8(L) 12.9(A) 12.9(A)  Hematocrit 38.5 - 50.0 % 39.6 40(A) 41  Platelets 140 - 400 Thousand/uL 141 159 135(A)    Lab Results  Component Value Date/Time   VD25OH 30.82 11/13/2019 12:00 AM   VD25OH 24 (L) 04/05/2017 08:47 AM    Social History   Tobacco Use  Smoking Status Former   Packs/day: 0.25   Years: 44.00   Pack years: 11.00   Types: Cigarettes   Quit date: 05/24/2007   Years since quitting: 14.3  Smokeless Tobacco Never   BP Readings from Last 3  Encounters:  07/28/21 139/67  01/13/21 116/60  10/14/20 (!) 107/59   Pulse Readings from Last 3 Encounters:  07/28/21 69  01/13/21 (!) 57  10/14/20 92   Wt Readings from Last 3 Encounters:  07/28/21 232 lb (105.2 kg)  01/13/21 219 lb 0.6 oz (99.4 kg)  10/14/20 213 lb (96.6 kg)    Assessment: Review of patient past medical history, allergies, medications, health status, including review of consultants reports, laboratory and other test data, was performed as part of comprehensive evaluation and provision of chronic care management services.   SDOH:  (Social Determinants of Health) assessments and interventions performed:    CCM Care Plan  Allergies  Allergen Reactions   Aspirin Other (See Comments), Itching and Rash     Gi bleeding   Clindamycin Itching   Clindamycin/Lincomycin     rash   Bactrim [Sulfamethoxazole-Trimethoprim] Itching and Rash    See progress note from Dr. [name] 07/19/2017. No obvious drug rash, questionable other allergy exposure    Medications Reviewed Today     Reviewed by Darius Bump, [facility] (Pharmacist) on 08/06/21 at 1128  Med List Status: <None>   Medication Order Taking? Sig Documenting Provider Last Dose Status Informant  bimatoprost (LUMIGAN) 0.01 % SOLN [id number] Yes Place 1 drop into the right eye nightly. [provider] Taking Active   Blood Glucose Monitoring Suppl (ACCU-CHEK GUIDE) w/Device KIT [id number] Yes INJECT 1 STICK INTO THE SKIN 2 (TWO) TIMES DAILY WITH BREAKFAST AND LUNCH. DX E11.9 DM [name], DO Taking Active   cetirizine (ZYRTEC) 10 MG tablet [id number] Yes Take 10 mg by mouth daily. [provider] Taking Active   Cholecalciferol (VITAMIN D3) 125 MCG (5000 UT) CAPS [id number] Yes Take by mouth. [provider] Taking Active Self  dorzolamide-timolol (COSOPT) 22.3-6.8 MG/ML ophthalmic solution [id number] Yes Place 1 drop into the right eye 2 times daily. [provider] Taking Active   glucose blood (ACCU-CHEK GUIDE) test strip [id number] Yes USE AS INSTRUCTED [name], DO Taking Active   HYDROcodone-acetaminophen (NORCO/VICODIN) 5-325 MG tablet [id number] Yes Take 1 tablet by mouth 3 (three) times daily as needed for moderate pain. [provider] Taking Active   insulin glargine (LANTUS) 100 UNIT/ML injection [id number] Yes Inject 0.12 mLs (12 Units total) into the skin at bedtime. [name], DO Taking Active   ipratropium (ATROVENT) 0.06 % nasal spray [id number] Yes PLACE 2 SPRAYS INTO BOTH NOSTRILS 4 (FOUR) TIMES DAILY. AS NEEDED FOR RUNNY NOSE / POSTNASAL DRIP Samuel Bouche, NP Taking Active   Lancets MISC [id number] Yes For use with glucometer qid as directed [name], DO Taking  Active   lisinopril (PRINIVIL,ZESTRIL) 5 MG tablet [id number] Yes Take 1 tablet (5 mg total) by mouth daily. [name], DO Taking Active Self  metFORMIN (GLUCOPHAGE) 1000 MG tablet [id number] Yes TAKE 1 TABLET BY MOUTH EVERY DAY WITH BREAKFAST Samuel Bouche, NP Taking Active   montelukast (SINGULAIR) 10 MG tablet [id number] Yes Take 1 tablet (10 mg total) by mouth at bedtime. For sinus pain. Marcial Pacas, DO Taking Active   pravastatin (PRAVACHOL) 20 MG tablet [id number] Yes Take 1 tablet (20 mg total) by mouth daily. Samuel Bouche, NP Taking Active   Semaglutide,0.25 or 0.5MG/DOS, (OZEMPIC, 0.25 OR 0.5 MG/DOSE,) 2 MG/1.5ML SOPN [id number]  Inject 0.25 mg into the skin once a week. Inject 0.25 mg subq qwk for 4 wk then inject 0.5 mg subq qwk x4 wk, then go to full dose pen qwk  Patient not taking: Reported  on 07/30/2021   Samuel Bouche, NP  Active   sildenafil (REVATIO) 20 MG tablet [id number] Yes Take 1-3 tablets (20-60 mg total) by mouth daily as needed (sex). Marcial Pacas, DO Taking Active             Patient Active Problem List   Diagnosis Date Noted   Type 2 diabetes mellitus with other specified complication ([place]) 19/14/7829   Diffuse large B-cell lymphoma of solid organ excluding spleen ([place]) 06/24/2020   Marginal zone lymphoma (HCC) 06/24/2020   Statin intolerance 11/01/2017   Lipoma of neck 10/12/2016   Cervical strain 10/12/2016   Ingrowing toenail 08/14/2016   History of prostate cancer 06/11/2014   Colon polyp 10/30/2013   TMJ syndrome 02/11/2013   Glaucoma 05/02/2012   Irregular heartbeat (Declines Workup 09/2011 [facility]) 05/02/2012   Erectile dysfunction 05/02/2012   Essential hypertension 04/17/2012   Left rotator cuff tear 04/17/2012   Sciatica 04/17/2012    Immunization History  Administered Date(s) Administered   Fluad Quad(high Dose 65+) 04/03/2019, 05/13/2020, 04/21/2021   Influenza Split 04/17/2012   Influenza, High Dose Seasonal PF 04/24/2014, 04/15/2015,  02/23/2016, 03/25/2016, 04/05/2017, 05/25/2017, 05/02/2018, 05/25/2018   Influenza,inj,Quad PF,6+ Mos 04/24/2013, 03/25/2014, 04/15/2015, 03/09/2016   Influenza-Unspecified 07/12/2011, 05/26/2019, 05/25/2020   Moderna Covid-19 Vaccine Bivalent Booster 28yr & up 06/28/2021   Moderna Sars-Covid-2 Vaccination 08/31/2019, 09/28/2019, 05/20/2020, 11/13/2020   Pneumococcal Conjugate-13 04/24/2013, 09/01/2015   Pneumococcal Polysaccharide-23 03/23/2012   Tdap 04/15/2015   Zoster Recombinat (Shingrix) 08/23/2018, 01/13/2019   Zoster, Live 10/30/2013    Conditions to be addressed/monitored: HTN, HLD, and DMII  There are no care plans that you recently modified to display for this patient.        Medication Assistance: None required.  Patient affirms current coverage meets needs.  [facility]  [id number] [address] [address]: [phone]: [address] [address]: [phone]: [phone]   Uses pill box? No - leaves in original bottles, works well for him  Pt endorses 90% compliance  Follow Up:  Patient agrees to Care Plan and Follow-up.  Plan: Telephone follow up appointment with care management team member scheduled for:  1 month  KeLarinda ButteryPharmD Clinical Pharmacist CoH[phone]

## 2021-09-21 ENCOUNTER — Other Ambulatory Visit: Payer: Self-pay | Admitting: Medical-Surgical

## 2021-10-03 ENCOUNTER — Other Ambulatory Visit: Payer: Self-pay | Admitting: Medical-Surgical

## 2021-11-24 ENCOUNTER — Encounter: Payer: Self-pay | Admitting: Medical-Surgical

## 2021-11-24 ENCOUNTER — Ambulatory Visit (INDEPENDENT_AMBULATORY_CARE_PROVIDER_SITE_OTHER): Payer: Medicare Other | Admitting: Medical-Surgical

## 2021-11-24 VITALS — BP 132/77 | HR 52 | Resp 97 | Ht 75.0 in | Wt 237.2 lb

## 2021-11-24 DIAGNOSIS — E785 Hyperlipidemia, unspecified: Secondary | ICD-10-CM

## 2021-11-24 DIAGNOSIS — Z789 Other specified health status: Secondary | ICD-10-CM

## 2021-11-24 DIAGNOSIS — E1169 Type 2 diabetes mellitus with other specified complication: Secondary | ICD-10-CM

## 2021-11-24 DIAGNOSIS — I10 Essential (primary) hypertension: Secondary | ICD-10-CM

## 2021-11-24 LAB — POCT GLYCOSYLATED HEMOGLOBIN (HGB A1C): HbA1c, POC (controlled diabetic range): 6.7 % (ref 0.0–7.0)

## 2021-11-24 LAB — POCT UA - MICROALBUMIN
Albumin/Creatinine Ratio, Urine, POC: <30
Creatinine, POC: 300 mg/dL
Microalbumin Ur, POC: 10 mg/L

## 2021-11-24 NOTE — Progress Notes (Signed)
?  HPI with pertinent ROS:  ? ?CC: DM/HLD follow up ? ? ?HPI: ?Very pleasant 76 year old male presenting today for follow-up on: ? ?Diabetes- ?Diabetes: ?Type: 2 ?Medications: Metformin '1000mg'$  daily, Lantus 12 units, didn't start Ozempic ?Compliant: Yes ?Side effects: None ?Checking sugars: yes intermittently, readings around 120 ?Diabetic diet: Yes ?Complications: No ?Eye exam: last week ?Foot exam: yes ?Microalbumin screening: Normal today ?Statin: Pravastatin ?ACE/ARB: lisinopril '5mg'$  daily ?Last A1c: 6.7% ? ?Hyperlipidemia ?Medication: Pravastatin '20mg'$  daily ?Side effects: None ?Low fat diet: Yes ? ?Hypertension ?Medication: Lisinopril '5mg'$  daily ?Compliant: Yes ?Side effects: None ?Checking BP at home: sometimes, readings at goal ?Low sodium diet: Yes ?Exercise: still working and stay active doing a lot of walking ?Concerning symptoms: None ? ?I reviewed the past medical history, family history, social history, surgical history, and allergies today and no changes were needed.  Please see the problem list section below in epic for further details. ? ? ?Physical exam:  ? ?General: Well Developed, well nourished, and in no acute distress.  ?Neuro: Alert and oriented x3.  ?HEENT: Normocephalic, atraumatic.  ?Skin: Warm and dry. ?Cardiac: Regular rate and rhythm, no murmurs rubs or gallops, no lower extremity edema.  ?Respiratory: Clear to auscultation bilaterally. Not using accessory muscles, speaking in full sentences. ? ?Impression and Recommendations:   ? ?1. Type 2 diabetes mellitus with other specified complication, without long-term current use of insulin ([place]) ?POCT hemoglobin A1c 6.7%.  Urine microalbumin normal. Continue Metformin and Lantus for now. When Ozempic is available, would recommend getting starting.  ? ?2. Essential hypertension ?BP elevated on arrival, recheck 132/77. At goal. Continue Lisinopril '5mg'$  daily. Monitor BP at home with a goal of 130/80 or less. Low sodium diet. Aim for regular  intentional exercise at least 3 times weekly.  ? ?3. Hyperlipidemia associated with type 2 diabetes mellitus ([place]) ?4. Statin intolerance ?Continue pravastatin 20 mg daily. ? ?Return in about 6 months (around 05/27/2022) for DM/HLD follow up. ?___________________________________________ ?Clearnce Sorrel, DNP, APRN, FNP-BC ?Primary Care and Sports Medicine ?[place] ?

## 2022-01-05 ENCOUNTER — Telehealth: Payer: Self-pay | Admitting: General Practice

## 2022-01-05 NOTE — Telephone Encounter (Signed)
Patient was scheduled for his medicare wellness visit. Attempted to call patients multiple times and left voicemail regarding the missed appointment.

## 2022-02-25 ENCOUNTER — Telehealth: Payer: Medicare Other | Admitting: Medical-Surgical

## 2022-05-04 ENCOUNTER — Other Ambulatory Visit: Payer: Self-pay | Admitting: Medical-Surgical

## 2022-05-04 DIAGNOSIS — E1169 Type 2 diabetes mellitus with other specified complication: Secondary | ICD-10-CM

## 2022-05-30 ENCOUNTER — Other Ambulatory Visit: Payer: Self-pay | Admitting: Medical-Surgical

## 2022-05-30 DIAGNOSIS — E1169 Type 2 diabetes mellitus with other specified complication: Secondary | ICD-10-CM

## 2022-06-01 ENCOUNTER — Encounter: Payer: Self-pay | Admitting: Medical-Surgical

## 2022-06-01 ENCOUNTER — Ambulatory Visit (INDEPENDENT_AMBULATORY_CARE_PROVIDER_SITE_OTHER): Payer: Medicare Other | Admitting: Medical-Surgical

## 2022-06-01 DIAGNOSIS — Z91199 Patient's noncompliance with other medical treatment and regimen due to unspecified reason: Secondary | ICD-10-CM

## 2022-06-01 DIAGNOSIS — E1169 Type 2 diabetes mellitus with other specified complication: Secondary | ICD-10-CM

## 2022-06-01 NOTE — Progress Notes (Signed)
   Complete physical exam  Patient: Calvin Wise   DOB: 05/14/1999   76 y.o. Male  MRN: [id number]  Subjective:    No chief complaint on file.   Calvin Wise is a 76 y.o. male who presents today for a complete physical exam. She reports consuming a {diet types:17450} diet. {types:19826} She generally feels {DESC; WELL/FAIRLY WELL/POORLY:18703}. She reports sleeping {DESC; WELL/FAIRLY WELL/POORLY:18703}. She {does/does not:200015} have additional problems to discuss today.    Most recent fall risk assessment:    01/19/2022   10:42 AM  Fall Risk   Falls in the past year? 0  Number falls in past yr: 0  Injury with Fall? 0  Risk for fall due to : No Fall Risks  Follow up Falls evaluation completed     Most recent depression screenings:    01/19/2022   10:42 AM 12/10/2020   10:46 AM  PHQ 2/9 Scores  PHQ - 2 Score 0 0  PHQ- 9 Score 5     {VISON DENTAL STD PSA (Optional):27386}  {History (Optional):23778}  Patient Care Team: Shemar Plemmons, NP as PCP - General (Nurse Practitioner)   Outpatient Medications Prior to Visit  Medication Sig   fluticasone (FLONASE) 50 MCG/ACT nasal spray Place 2 sprays into both nostrils in the morning and at bedtime. After 7 days, reduce to once daily.   norgestimate-ethinyl estradiol (SPRINTEC 28) 0.25-35 MG-MCG tablet Take 1 tablet by mouth daily.   Nystatin POWD Apply liberally to affected area 2 times per day   spironolactone (ALDACTONE) 100 MG tablet Take 1 tablet (100 mg total) by mouth daily.   No facility-administered medications prior to visit.    ROS        Objective:     There were no vitals taken for this visit. {Vitals History (Optional):23777}  Physical Exam   No results found for any visits on 02/24/22. {Show previous labs (optional):23779}    Assessment & Plan:    Routine Health Maintenance and Physical Exam  Immunization History  Administered Date(s) Administered   DTaP 07/28/1999, 09/23/1999,  12/02/1999, 08/17/2000, 03/02/2004   Hepatitis A 12/28/2007, 01/02/2009   Hepatitis B 05/15/1999, 06/22/1999, 12/02/1999   HiB (PRP-OMP) 07/28/1999, 09/23/1999, 12/02/1999, 08/17/2000   IPV 07/28/1999, 09/23/1999, 05/22/2000, 03/02/2004   Influenza,inj,Quad PF,6+ Mos 04/04/2014   Influenza-Unspecified 07/04/2012   MMR 05/22/2001, 03/02/2004   Meningococcal Polysaccharide 01/02/2012   Pneumococcal Conjugate-13 08/17/2000   Pneumococcal-Unspecified 12/02/1999, 02/15/2000   Tdap 01/02/2012   Varicella 05/22/2000, 12/28/2007    Health Maintenance  Topic Date Due   HIV Screening  Never done   Hepatitis C Screening  Never done   INFLUENZA VACCINE  02/22/2022   PAP-Cervical Cytology Screening  02/24/2022 (Originally 05/13/2020)   PAP SMEAR-Modifier  02/24/2022 (Originally 05/13/2020)   TETANUS/TDAP  02/24/2022 (Originally 01/01/2022)   HPV VACCINES  Discontinued   COVID-19 Vaccine  Discontinued    Discussed health benefits of physical activity, and encouraged her to engage in regular exercise appropriate for her age and condition.  Problem List Items Addressed This Visit   None Visit Diagnoses     Annual physical exam    -  Primary   Cervical cancer screening       Need for Tdap vaccination          No follow-ups on file.     Sayler Mickiewicz, NP   

## 2022-06-10 ENCOUNTER — Other Ambulatory Visit: Payer: Self-pay | Admitting: Medical-Surgical

## 2022-06-10 DIAGNOSIS — Z794 Long term (current) use of insulin: Secondary | ICD-10-CM

## 2022-06-16 ENCOUNTER — Other Ambulatory Visit: Payer: Self-pay | Admitting: Medical-Surgical

## 2022-06-16 DIAGNOSIS — E1169 Type 2 diabetes mellitus with other specified complication: Secondary | ICD-10-CM

## 2022-07-05 NOTE — Progress Notes (Signed)
Opened in error.  Calvin Wise, CMA(AAMA)

## 2022-07-06 ENCOUNTER — Ambulatory Visit: Payer: Medicare Other | Admitting: Medical-Surgical

## 2022-07-06 DIAGNOSIS — E1169 Type 2 diabetes mellitus with other specified complication: Secondary | ICD-10-CM

## 2022-07-14 ENCOUNTER — Other Ambulatory Visit: Payer: Self-pay | Admitting: Medical-Surgical

## 2022-07-14 DIAGNOSIS — Z794 Long term (current) use of insulin: Secondary | ICD-10-CM

## 2022-07-27 ENCOUNTER — Other Ambulatory Visit: Payer: Self-pay | Admitting: Medical-Surgical

## 2022-07-27 DIAGNOSIS — E1169 Type 2 diabetes mellitus with other specified complication: Secondary | ICD-10-CM

## 2022-07-28 NOTE — Telephone Encounter (Signed)
Patient has an appointment with you 08/01/2022

## 2022-08-02 ENCOUNTER — Ambulatory Visit: Payer: Medicare Other | Admitting: Medical-Surgical

## 2022-08-31 ENCOUNTER — Encounter: Payer: Self-pay | Admitting: Medical-Surgical

## 2022-08-31 ENCOUNTER — Ambulatory Visit (INDEPENDENT_AMBULATORY_CARE_PROVIDER_SITE_OTHER): Payer: Medicare Other | Admitting: Medical-Surgical

## 2022-08-31 VITALS — BP 131/79 | HR 52 | Resp 20 | Ht 75.0 in | Wt 238.1 lb

## 2022-08-31 DIAGNOSIS — E1169 Type 2 diabetes mellitus with other specified complication: Secondary | ICD-10-CM | POA: Diagnosis not present

## 2022-08-31 DIAGNOSIS — I10 Essential (primary) hypertension: Secondary | ICD-10-CM | POA: Diagnosis not present

## 2022-08-31 DIAGNOSIS — R051 Acute cough: Secondary | ICD-10-CM | POA: Insufficient documentation

## 2022-08-31 DIAGNOSIS — M545 Low back pain, unspecified: Secondary | ICD-10-CM | POA: Insufficient documentation

## 2022-08-31 DIAGNOSIS — M79641 Pain in right hand: Secondary | ICD-10-CM | POA: Insufficient documentation

## 2022-08-31 DIAGNOSIS — Z029 Encounter for administrative examinations, unspecified: Secondary | ICD-10-CM | POA: Insufficient documentation

## 2022-08-31 DIAGNOSIS — M170 Bilateral primary osteoarthritis of knee: Secondary | ICD-10-CM | POA: Insufficient documentation

## 2022-08-31 DIAGNOSIS — B349 Viral infection, unspecified: Secondary | ICD-10-CM | POA: Insufficient documentation

## 2022-08-31 DIAGNOSIS — E785 Hyperlipidemia, unspecified: Secondary | ICD-10-CM | POA: Diagnosis not present

## 2022-08-31 DIAGNOSIS — H401133 Primary open-angle glaucoma, bilateral, severe stage: Secondary | ICD-10-CM | POA: Insufficient documentation

## 2022-08-31 DIAGNOSIS — Z789 Other specified health status: Secondary | ICD-10-CM | POA: Diagnosis not present

## 2022-08-31 DIAGNOSIS — M5137 Other intervertebral disc degeneration, lumbosacral region: Secondary | ICD-10-CM | POA: Insufficient documentation

## 2022-08-31 DIAGNOSIS — Z202 Contact with and (suspected) exposure to infections with a predominantly sexual mode of transmission: Secondary | ICD-10-CM | POA: Insufficient documentation

## 2022-08-31 DIAGNOSIS — L603 Nail dystrophy: Secondary | ICD-10-CM | POA: Insufficient documentation

## 2022-08-31 LAB — POCT GLYCOSYLATED HEMOGLOBIN (HGB A1C): Hemoglobin A1C: 8.7 % — AB (ref 4.0–5.6)

## 2022-08-31 MED ORDER — METFORMIN HCL 1000 MG PO TABS: 1000.0000 mg | ORAL_TABLET | Freq: Every day | ORAL | 1 refills | Status: AC

## 2022-08-31 MED ORDER — PRAVASTATIN SODIUM 20 MG PO TABS: 20.0000 mg | ORAL_TABLET | Freq: Every day | ORAL | 3 refills | Status: AC

## 2022-08-31 MED ORDER — OZEMPIC (0.25 OR 0.5 MG/DOSE) 2 MG/3ML ~~LOC~~ SOPN: 0.5000 mg | PEN_INJECTOR | SUBCUTANEOUS | 1 refills | Status: DC

## 2022-08-31 NOTE — Progress Notes (Signed)
Established Patient Office Visit  Subjective   Patient ID: Calvin Wise, male   DOB: 07/23/1946 Age: 77 y.o. MRN: [id number]   Chief Complaint  Patient presents with   Hemoglobin A1c Screening   Follow-up    HPI For follow-up.  He is seen by the [facility] who manages most of his care.  Today he presents reporting he needs refills on a couple of different medications.  He is followed by primary care at the [facility] that was helping manage his diabetes while he was on steroids but notes that they manage his insulin but he gets his metformin and such here.  His last hemoglobin A1c was 6.7%.  Reports no changes in dietary habits.  No regular intentional exercise but does go to work at a physically demanding job every day.  Insulin that he uses has been on backorder and he is worried that his insulin may be old as he has not gotten a new batch recently.   Objective:    Vitals:   08/31/22 1001  BP: 131/79  Pulse: (!) 52  Resp: 20  Height: '6\' 3"'$  (1.905 m)  Weight: 238 lb 1.6 oz (108 kg)  SpO2: 98%  BMI (Calculated): 29.76    Physical Exam Vitals reviewed.  Constitutional:      General: He is not in acute distress.    Appearance: Normal appearance. He is obese. He is not ill-appearing.  HENT:     Head: Normocephalic and atraumatic.  Cardiovascular:     Rate and Rhythm: Regular rhythm. Bradycardia present.     Pulses: Normal pulses.     Heart sounds: Normal heart sounds. No murmur heard.    No friction rub. No gallop.  Pulmonary:     Effort: Pulmonary effort is normal. No respiratory distress.     Breath sounds: Normal breath sounds.  Skin:    General: Skin is warm and dry.  Neurological:     Mental Status: He is alert and oriented to person, place, and time.  Psychiatric:        Mood and Affect: Mood normal.        Behavior: Behavior normal.        Thought Content: Thought content normal.        Judgment: Judgment normal.    Results for orders placed or performed in visit on  08/31/22 (from the past 24 hour(s))  POCT HgB A1C     Status: Abnormal   Collection Time: 08/31/22 10:11 AM  Result Value Ref Range   Hemoglobin A1C 8.7 (A) 4.0 - 5.6 %   HbA1c POC (<> result, manual entry)     HbA1c, POC (prediabetic range)     HbA1c, POC (controlled diabetic range)         The ASCVD Risk score (Arnett DK, et al., 2019) failed to calculate for the following reasons:   The valid total cholesterol range is 130 to 320 mg/dL   Assessment & Plan:   1. Type 2 diabetes mellitus with other specified complication, without long-term current use of insulin (HCC) POCT C8 0.7% today.  With no change in activity or dietary habits, concern for potential out of date insulin.  Advised him to check his expiration dates and make sure his insulin is good and stored properly.  He never started Ozempic that was previously prescribed because the [place] when given to him.  Continue insulin glargine as prescribed.  Continue metformin 1000 mg daily.  Adding Ozempic 0.25 mg weekly x  4 weeks (sample pen given).  Sending in Ozempic 0.5 mg weekly to see if we can get this covered by his insurance at a local pharmacy. - POCT HgB A1C - metFORMIN (GLUCOPHAGE) 1000 MG tablet; Take 1 tablet (1,000 mg total) by mouth daily.  Dispense: 90 tablet; Refill: 1  2. Essential hypertension Blood pressure looks great today.  Continue current medications.  3. Hyperlipidemia associated with type 2 diabetes mellitus ([place]) Checking lipid panel today.  Continue pravastatin 20 mg daily. - Lipid panel - pravastatin (PRAVACHOL) 20 MG tablet; Take 1 tablet (20 mg total) by mouth daily.  Dispense: 90 tablet; Refill: 3  4. Statin intolerance Tolerating pravastatin well  Return in about 3 months (around 11/29/2022) for DM follow up.  ___________________________________________ Clearnce Sorrel, DNP, APRN, FNP-BC Primary Care and [place]

## 2022-09-14 LAB — LIPID PANEL
Cholesterol: 89 (ref 0–200)
HDL: 42 (ref 35–70)
LDL Cholesterol: 31
Triglycerides: 80 (ref 40–160)

## 2022-09-14 LAB — CBC AND DIFFERENTIAL
HCT: 63 — AB (ref 41–53)
Hemoglobin: 12.7 — AB (ref 13.5–17.5)
Platelets: 169 10*3/uL (ref 150–400)
WBC: 6.4

## 2022-09-14 LAB — BASIC METABOLIC PANEL
BUN: 15 (ref 4–21)
CO2: 32 — AB (ref 13–22)
Chloride: 104 (ref 99–108)
Creatinine: 1.1 (ref 0.6–1.3)
Glucose: 137
Potassium: 3.9 mEq/L (ref 3.5–5.1)
Sodium: 137 (ref 137–147)

## 2022-09-14 LAB — HEPATIC FUNCTION PANEL
ALT: 31 U/L (ref 10–40)
AST: 18 (ref 14–40)
Alkaline Phosphatase: 57 (ref 25–125)
Bilirubin, Direct: 0.2
Bilirubin, Total: 0.7

## 2022-09-14 LAB — TSH: TSH: 1.82 (ref 0.41–5.90)

## 2022-09-14 LAB — PROTEIN / CREATININE RATIO, URINE
Albumin, U: 0.789
Creatinine, Urine: 146

## 2022-09-14 LAB — HEMOGLOBIN A1C: Hemoglobin A1C: 8.5

## 2022-09-14 LAB — CBC: RBC: 4.43 (ref 3.87–5.11)

## 2022-09-14 LAB — MICROALBUMIN / CREATININE URINE RATIO: Microalb Creat Ratio: 5.4

## 2022-09-14 LAB — COMPREHENSIVE METABOLIC PANEL
Albumin: 3.8 (ref 3.5–5.0)
Calcium: 8.9 (ref 8.7–10.7)

## 2022-10-17 ENCOUNTER — Telehealth: Payer: Self-pay | Admitting: Medical-Surgical

## 2022-10-17 NOTE — Telephone Encounter (Signed)
Called patient to schedule Medicare Annual Wellness Visit (AWV). Left message for patient to call back and schedule Medicare Annual Wellness Visit (AWV).  Last date of AWV: 08/17/2020  Please schedule an appointment at any time with [facility].  If any questions, please contact me at [phone].  Thank you ,  Lin Givens Patient Access Advocate II Direct Dial: [phone]

## 2022-11-10 DIAGNOSIS — H401133 Primary open-angle glaucoma, bilateral, severe stage: Secondary | ICD-10-CM | POA: Diagnosis not present

## 2022-11-10 DIAGNOSIS — H35033 Hypertensive retinopathy, bilateral: Secondary | ICD-10-CM | POA: Diagnosis not present

## 2022-11-11 DIAGNOSIS — M12812 Other specific arthropathies, not elsewhere classified, left shoulder: Secondary | ICD-10-CM | POA: Diagnosis not present

## 2022-11-11 DIAGNOSIS — E119 Type 2 diabetes mellitus without complications: Secondary | ICD-10-CM | POA: Diagnosis not present

## 2022-11-11 DIAGNOSIS — M12811 Other specific arthropathies, not elsewhere classified, right shoulder: Secondary | ICD-10-CM | POA: Diagnosis not present

## 2022-11-22 DIAGNOSIS — R911 Solitary pulmonary nodule: Secondary | ICD-10-CM | POA: Diagnosis not present

## 2022-11-22 DIAGNOSIS — C8339 Diffuse large B-cell lymphoma, extranodal and solid organ sites: Secondary | ICD-10-CM | POA: Diagnosis not present

## 2022-11-24 LAB — COMPREHENSIVE METABOLIC PANEL: eGFR: 69

## 2022-12-07 ENCOUNTER — Encounter: Payer: Self-pay | Admitting: Medical-Surgical

## 2022-12-07 ENCOUNTER — Ambulatory Visit (INDEPENDENT_AMBULATORY_CARE_PROVIDER_SITE_OTHER): Payer: Medicare Other | Admitting: Medical-Surgical

## 2022-12-07 VITALS — BP 127/72 | HR 63 | Resp 20 | Ht 75.0 in | Wt 228.9 lb

## 2022-12-07 DIAGNOSIS — J019 Acute sinusitis, unspecified: Secondary | ICD-10-CM | POA: Insufficient documentation

## 2022-12-07 DIAGNOSIS — Z7984 Long term (current) use of oral hypoglycemic drugs: Secondary | ICD-10-CM | POA: Diagnosis not present

## 2022-12-07 DIAGNOSIS — E785 Hyperlipidemia, unspecified: Secondary | ICD-10-CM

## 2022-12-07 DIAGNOSIS — I10 Essential (primary) hypertension: Secondary | ICD-10-CM | POA: Diagnosis not present

## 2022-12-07 DIAGNOSIS — K219 Gastro-esophageal reflux disease without esophagitis: Secondary | ICD-10-CM | POA: Insufficient documentation

## 2022-12-07 DIAGNOSIS — E1169 Type 2 diabetes mellitus with other specified complication: Secondary | ICD-10-CM

## 2022-12-07 DIAGNOSIS — K439 Ventral hernia without obstruction or gangrene: Secondary | ICD-10-CM | POA: Insufficient documentation

## 2022-12-07 LAB — POCT UA - MICROALBUMIN
Creatinine, POC: 50 mg/dL
Microalbumin Ur, POC: 10 mg/L

## 2022-12-07 LAB — POCT GLYCOSYLATED HEMOGLOBIN (HGB A1C)
HbA1c, POC (controlled diabetic range): 7.8 % — AB (ref 0.0–7.0)
Hemoglobin A1C: 7.8 % — AB (ref 4.0–5.6)

## 2022-12-07 NOTE — Progress Notes (Signed)
        Established patient visit  History, exam, impression, and plan:  1. Type 2 diabetes mellitus with other specified complication, without long-term current use of insulin [facility]) Very pleasant 77 year old male presenting today for follow-up on diabetes.  He is currently also monitored for diabetes by the [place] but prefers to have both providers evaluating his progress.  [facility]  Not taking Ozempic anymore.  Currently on metformin 1000 mg daily, empagliflozin 12.5 mg daily, and Lantus 14 units nightly.  Tolerating all medications well without side effects.  Recently started empagliflozin and has only been on this for about 6 weeks.  Occasionally monitoring sugars at home and notes that they have been doing fairly well.  POCT hemoglobin A1c in early February 8 0.7%, recheck today 7.8% showing some improvement.  Recommend continuing to work on diabetic diet recommendations.  [facility].  Advised that if the [place] is monitoring this and prescribing his medications I will not make any medication changes today.  Would like to see a full 3 months on the empagliflozin to determine recommended dosage modifications.  Continue monitoring sugars at home with fasting goals of 90-120.  POCT microalbumin abnormal today. - POCT HgB A1C - POCT UA - Microalbumin  2. Hyperlipidemia associated with type 2 diabetes mellitus (HCC) Take pravastatin 20 mg daily, tolerating well without side effects.  Following a low-fat heart healthy diet.  Staying physically active.  Up-to-date labs on file.  Continue pravastatin as prescribed.  3. Essential hypertension Blood pressure looks great today.  Has not monitored regularly at his frequent doctors appointments with no abnormalities.  Currently taking lisinopril 5 mg daily, tolerating well without side effects.  Following low-sodium heart healthy diet.  Denies CP, SOB, LE edema, palpitations, headaches,  dizziness, and abrupt vision changes.  HRRR, S1/S2 normal.  Lungs CTA.  No peripheral edema.  Blood pressure at goal.  Continue lisinopril as prescribed.  Procedures performed this visit: None.  Return in about 4 months (around 04/09/2023) for chronic disease follow up.  __________________________________ Thayer Ohm, DNP, APRN, FNP-BC Primary Care and Sports Medicine [facility]

## 2022-12-14 ENCOUNTER — Encounter: Payer: Self-pay | Admitting: Medical-Surgical

## 2023-04-12 ENCOUNTER — Ambulatory Visit: Payer: Medicare Other | Admitting: Medical-Surgical

## 2023-04-14 ENCOUNTER — Ambulatory Visit (INDEPENDENT_AMBULATORY_CARE_PROVIDER_SITE_OTHER): Payer: Medicare Other | Admitting: Medical-Surgical

## 2023-04-14 ENCOUNTER — Encounter: Payer: Self-pay | Admitting: Medical-Surgical

## 2023-04-14 VITALS — BP 119/68 | HR 97 | Resp 20 | Ht 75.0 in | Wt 229.1 lb

## 2023-04-14 DIAGNOSIS — I10 Essential (primary) hypertension: Secondary | ICD-10-CM | POA: Diagnosis not present

## 2023-04-14 DIAGNOSIS — Z794 Long term (current) use of insulin: Secondary | ICD-10-CM | POA: Diagnosis not present

## 2023-04-14 DIAGNOSIS — E785 Hyperlipidemia, unspecified: Secondary | ICD-10-CM | POA: Diagnosis not present

## 2023-04-14 DIAGNOSIS — T783XXA Angioneurotic edema, initial encounter: Secondary | ICD-10-CM | POA: Diagnosis not present

## 2023-04-14 DIAGNOSIS — E1169 Type 2 diabetes mellitus with other specified complication: Secondary | ICD-10-CM | POA: Diagnosis not present

## 2023-04-14 LAB — POCT GLYCOSYLATED HEMOGLOBIN (HGB A1C)
HbA1c, POC (controlled diabetic range): 7 % (ref 0.0–7.0)
Hemoglobin A1C: 7 % — AB (ref 4.0–5.6)

## 2023-04-14 MED ORDER — DIPHENHYDRAMINE HCL 25 MG PO CAPS
50.0000 mg | ORAL_CAPSULE | Freq: Once | ORAL | Status: AC
Start: 2023-04-14 — End: 2023-04-14
  Administered 2023-04-14: 50 mg via ORAL

## 2023-04-14 MED ORDER — METHYLPREDNISOLONE ACETATE 80 MG/ML IJ SUSP
80.0000 mg | Freq: Once | INTRAMUSCULAR | Status: AC
Start: 2023-04-14 — End: 2023-04-14
  Administered 2023-04-14: 80 mg via INTRAMUSCULAR

## 2023-04-14 NOTE — Progress Notes (Signed)
        Established patient visit  History, exam, impression, and plan:  1. Essential hypertension Patient presents today with a history of hypertension currently treated with lisinopril 5 mg daily.  He has been on this long-term and his blood pressures remained stable.  See below for concerns regarding angioedema.  2. Type 2 diabetes mellitus with other specified complication, without long-term current use of insulin [facility]) Pleasant 77 year old male presenting today for follow-up on type 2 diabetes.  He is mainly managed by the providers at the [place] however likes to come and have his A1c checked here.  Approximately 4 months ago his A1c was 7.8%.  He has been using metformin 1000 mg daily and Soliqua 14 units nightly.  Tolerating both medications well without side effects.  Occasionally checks his sugars and reports they are usually at goal.  Recheck of hemoglobin A1c today shows improvement to 7.0%.  [facility].  We will try to find records of this to update our system.  No change in current medications. - POCT HgB A1C  3. Hyperlipidemia associated with type 2 diabetes mellitus (HCC) He is taking pravastatin 20 mg daily, tolerating well without side effects.  Has had recent labs and is monitored by the [place].  4. Angioedema, initial encounter Reports that he woke up this morning with a small area of swelling in the center of his upper lip and it has progressed to the entire upper lip area.  Swelling is significant on arrival today.  Reports he got the flu shot on Wednesday and wonders if the swelling is related to that.  Denies swelling of the tongue, throat, or other parts of the face.  No respiratory difficulty or trouble swallowing.  Unclear etiology of angioedema.  Consider possible reaction to the flu shot although this is unlikely.  He has been treated long-term with an ACE inhibitor and had no problems previously.  Advised that he hold the lisinopril for the next  2 weeks and reach out to the [place] regarding instructions.  They may want to switch this to an ARB to be safe.  In the meantime, treating with 80 mg Depo-Medrol IM x 1 in office.  As patient drove himself, we provided him with 50 mg of Benadryl to take once he gets home to avoid sedation while driving.  Patient verbalized understanding and is agreeable to the plan. - methylPREDNISolone acetate (DEPO-MEDROL) injection 80 mg - diphenhydrAMINE (BENADRYL) capsule 50 mg   Procedures performed this visit: None.  Return in about 6 months (around 10/12/2023) for chronic disease follow up.  __________________________________ Thayer Ohm, DNP, APRN, FNP-BC Primary Care and Sports Medicine [facility] [place]

## 2023-07-07 DIAGNOSIS — H35033 Hypertensive retinopathy, bilateral: Secondary | ICD-10-CM | POA: Diagnosis not present

## 2023-07-07 DIAGNOSIS — H401133 Primary open-angle glaucoma, bilateral, severe stage: Secondary | ICD-10-CM | POA: Diagnosis not present

## 2023-09-07 DIAGNOSIS — M12811 Other specific arthropathies, not elsewhere classified, right shoulder: Secondary | ICD-10-CM | POA: Diagnosis not present

## 2023-09-07 DIAGNOSIS — M12812 Other specific arthropathies, not elsewhere classified, left shoulder: Secondary | ICD-10-CM | POA: Diagnosis not present

## 2023-10-12 ENCOUNTER — Ambulatory Visit: Payer: Medicare Other | Admitting: Medical-Surgical

## 2023-11-10 DIAGNOSIS — H26491 Other secondary cataract, right eye: Secondary | ICD-10-CM | POA: Diagnosis not present

## 2023-11-10 DIAGNOSIS — H401133 Primary open-angle glaucoma, bilateral, severe stage: Secondary | ICD-10-CM | POA: Diagnosis not present

## 2023-11-10 DIAGNOSIS — H35033 Hypertensive retinopathy, bilateral: Secondary | ICD-10-CM | POA: Diagnosis not present

## 2023-11-10 DIAGNOSIS — Z961 Presence of intraocular lens: Secondary | ICD-10-CM | POA: Diagnosis not present

## 2024-03-14 DIAGNOSIS — H35033 Hypertensive retinopathy, bilateral: Secondary | ICD-10-CM | POA: Diagnosis not present

## 2024-03-14 DIAGNOSIS — H401133 Primary open-angle glaucoma, bilateral, severe stage: Secondary | ICD-10-CM | POA: Diagnosis not present

## 2024-03-14 DIAGNOSIS — Z961 Presence of intraocular lens: Secondary | ICD-10-CM | POA: Diagnosis not present

## 2024-04-11 DIAGNOSIS — Z96611 Presence of right artificial shoulder joint: Secondary | ICD-10-CM | POA: Diagnosis not present

## 2024-04-11 DIAGNOSIS — Z471 Aftercare following joint replacement surgery: Secondary | ICD-10-CM | POA: Diagnosis not present

## 2024-05-23 ENCOUNTER — Ambulatory Visit (INDEPENDENT_AMBULATORY_CARE_PROVIDER_SITE_OTHER): Admitting: Medical-Surgical

## 2024-05-23 ENCOUNTER — Encounter: Payer: Self-pay | Admitting: Medical-Surgical

## 2024-05-23 VITALS — BP 127/74 | HR 73 | Resp 20 | Ht 75.0 in | Wt 242.0 lb

## 2024-05-23 DIAGNOSIS — B360 Pityriasis versicolor: Secondary | ICD-10-CM | POA: Diagnosis not present

## 2024-05-23 DIAGNOSIS — E1169 Type 2 diabetes mellitus with other specified complication: Secondary | ICD-10-CM

## 2024-05-23 DIAGNOSIS — Z794 Long term (current) use of insulin: Secondary | ICD-10-CM | POA: Diagnosis not present

## 2024-05-23 LAB — POCT GLYCOSYLATED HEMOGLOBIN (HGB A1C)
HbA1c, POC (controlled diabetic range): 6.6 % (ref 0.0–7.0)
Hemoglobin A1C: 6.6 % — AB (ref 4.0–5.6)

## 2024-05-23 MED ORDER — FLUCONAZOLE 150 MG PO TABS: ORAL_TABLET | ORAL | 0 refills | Status: AC
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
## 2024-05-27 ENCOUNTER — Telehealth: Payer: Self-pay

## 2024-05-27 NOTE — Telephone Encounter (Signed)
 Copied from CRM [id number]. Topic: Clinical - Lab/Test Results >> May 27, 2024 11:17 AM Dedra B wrote: Reason for CRM: Pt said his A1c is not listed on the paperwork he received from his last visit, and the [place] needs that information. He would like the results printed and he will pick them up. Pls notify pt when it's ready for pick up.

## 2024-05-27 NOTE — Telephone Encounter (Signed)
 Printed . Patient informed . Patient requested these results be placed in the mail to him . Placed results in mail to patient.

## 2024-06-12 NOTE — Progress Notes (Signed)
 Calvin Wise                                          MRN: [id number]   06/12/2024   The [facility] Quality Team Specialist reviewed this patient medical record for the purposes of chart review for care gap closure. The following were reviewed: abstraction for care gap closure-kidney health evaluation for diabetes:eGFR  and uACR.    [facility] Quality Team

## 2024-06-13 ENCOUNTER — Ambulatory Visit

## 2024-07-04 ENCOUNTER — Ambulatory Visit

## 2025-05-23 ENCOUNTER — Ambulatory Visit: Admitting: Medical-Surgical
